# Patient Record
Sex: Female | Born: 1965 | Race: Black or African American | Hispanic: No | Marital: Single | Smoking: Former smoker
Health system: Southern US, Community
[De-identification: ages and names within clinical notes are randomized; demographics above are authoritative.]

## PROBLEM LIST (undated history)

## (undated) DIAGNOSIS — E785 Hyperlipidemia, unspecified: Secondary | ICD-10-CM

## (undated) DIAGNOSIS — I480 Paroxysmal atrial fibrillation: Secondary | ICD-10-CM

## (undated) DIAGNOSIS — I509 Heart failure, unspecified: Secondary | ICD-10-CM

## (undated) DIAGNOSIS — I10 Essential (primary) hypertension: Secondary | ICD-10-CM

## (undated) DIAGNOSIS — E119 Type 2 diabetes mellitus without complications: Secondary | ICD-10-CM

## (undated) HISTORY — DX: Paroxysmal atrial fibrillation: I48.0

## (undated) HISTORY — DX: Heart failure, unspecified: I50.9

## (undated) HISTORY — PX: DILATION AND CURETTAGE OF UTERUS: SHX78

## (undated) HISTORY — PX: KNEE ARTHROSCOPY AND ARTHROTOMY: SUR84

## (undated) HISTORY — DX: Hyperlipidemia, unspecified: E78.5

---

## 2017-11-28 ENCOUNTER — Encounter: Payer: Self-pay | Admitting: Certified Nurse Midwife

## 2017-12-10 ENCOUNTER — Emergency Department
Admission: EM | Admit: 2017-12-10 | Discharge: 2017-12-10 | Disposition: A | Discharge status: Home / Self Care | Payer: Self-pay | Attending: Emergency Medicine | Admitting: Emergency Medicine

## 2017-12-10 ENCOUNTER — Encounter: Payer: Self-pay | Admitting: Emergency Medicine

## 2017-12-10 DIAGNOSIS — I10 Essential (primary) hypertension: Secondary | ICD-10-CM | POA: Insufficient documentation

## 2017-12-10 DIAGNOSIS — Z79899 Other long term (current) drug therapy: Secondary | ICD-10-CM | POA: Insufficient documentation

## 2017-12-10 DIAGNOSIS — E119 Type 2 diabetes mellitus without complications: Secondary | ICD-10-CM | POA: Insufficient documentation

## 2017-12-10 DIAGNOSIS — Z760 Encounter for issue of repeat prescription: Secondary | ICD-10-CM | POA: Insufficient documentation

## 2017-12-10 HISTORY — DX: Essential (primary) hypertension: I10

## 2017-12-10 HISTORY — DX: Type 2 diabetes mellitus without complications: E11.9

## 2017-12-10 MED ORDER — LISINOPRIL 10 MG PO TABS: 10.0000 mg | ORAL_TABLET | Freq: Every day | ORAL | 0 refills | Status: DC

## 2017-12-10 MED ORDER — METOPROLOL TARTRATE 50 MG PO TABS
50.0000 mg | ORAL_TABLET | Freq: Once | ORAL | Status: AC
  Administered 2017-12-10: 50 mg via ORAL
  Filled 2017-12-10: qty 1

## 2017-12-10 MED ORDER — METOPROLOL TARTRATE 50 MG PO TABS: 50.0000 mg | ORAL_TABLET | Freq: Two times a day (BID) | ORAL | 0 refills | Status: DC

## 2017-12-10 MED ORDER — LISINOPRIL 10 MG PO TABS
10.0000 mg | ORAL_TABLET | Freq: Once | ORAL | Status: AC
  Administered 2017-12-10: 10 mg via ORAL
  Filled 2017-12-10: qty 1

## 2017-12-10 NOTE — ED Triage Notes (Signed)
Patient states she ran out of her blood pressure medication yesterday.  Lisinopril 10mg  once a day and metoprolol 50mg  2x daily.  Patient states [facility] [place] [facility] [place] and does not yet have a primary care doctor.

## 2017-12-10 NOTE — ED Provider Notes (Signed)
[facility] Emergency Department Provider Note  ____________________________________________  Time seen: Approximately 12:45 PM  I have reviewed the triage vital signs and the nursing notes.   HISTORY  Chief Complaint Medication Refill    HPI Jennifer Miller is a 52 y.o. female presents emergency department for medication.  Patient has been on lisinopril and metoprolol for years.  She ran out of her medication yesterday.  She just moved here from [place].  She tried to go to urgent care yesterday for refill and her Medicaid was not accepted yet.  She has an appointment with primary care to be established on the 29th.  No concerns with medications.  She denies any symptoms.  Past Medical History:  Diagnosis Date  . Diabetes mellitus without complication (HCC)   . Hypertension     There are no active problems to display for this patient.    Prior to Admission medications   Medication Sig Start Date End Date Taking? Authorizing Provider  lisinopril (PRINIVIL,ZESTRIL) 10 MG tablet Take 10 mg by mouth daily.   Yes [provider]  metoprolol tartrate (LOPRESSOR) 50 MG tablet Take 50 mg by mouth 2 (two) times daily.   Yes [provider]  lisinopril (PRINIVIL,ZESTRIL) 10 MG tablet Take 1 tablet (10 mg total) by mouth daily. 12/10/17 12/10/18  Enid Derry, PA-C  metoprolol tartrate (LOPRESSOR) 50 MG tablet Take 1 tablet (50 mg total) by mouth 2 (two) times daily. 12/10/17 01/09/18  Enid Derry, PA-C    Allergies Patient has no known allergies.  No family history on file.  Social History Social History   Tobacco Use  . Smoking status: Not on file  Substance Use Topics  . Alcohol use: Not on file  . Drug use: Not on file     Review of Systems  Cardiovascular: No chest pain. Respiratory: No SOB. Skin: Negative for rash, abrasions, lacerations, ecchymosis. Neurological: Negative for headaches, numbness or  tingling   ____________________________________________   PHYSICAL EXAM:  VITAL SIGNS: ED Triage Vitals  Enc Vitals Group     BP 12/10/17 1216 (!) 180/107     Pulse Rate 12/10/17 1216 88     Resp 12/10/17 1216 14     Temp 12/10/17 1216 98.1 F (36.7 C)     Temp Source 12/10/17 1216 Oral     SpO2 12/10/17 1216 94 %     Weight 12/10/17 1212 248 lb (112.5 kg)     Height 12/10/17 1212 5\' 6"  (1.676 m)     Head Circumference --      Peak Flow --      Pain Score 12/10/17 1211 6     Pain Loc --      Pain Edu? --      Excl. in GC? --      Constitutional: Alert and oriented. Well appearing and in no acute distress. Eyes: Conjunctivae are normal. PERRL. EOMI. Head: Atraumatic. ENT:      Ears:      Nose: No congestion/rhinnorhea.      Mouth/Throat: Mucous membranes are moist.  Neck: No stridor.  Cardiovascular: Normal rate, regular rhythm.  Good peripheral circulation. Respiratory: Normal respiratory effort without tachypnea or retractions. Lungs CTAB. Good air entry to the bases with no decreased or absent breath sounds. Musculoskeletal: Full range of motion to all extremities. No gross deformities appreciated. Neurologic:  Normal speech and language. No gross focal neurologic deficits are appreciated.  Skin:  Skin is warm, dry and intact. No rash  noted. Psychiatric: Mood and affect are normal. Speech and behavior are normal. Patient exhibits appropriate insight and judgement.   ____________________________________________   LABS (all labs ordered are listed, but only abnormal results are displayed)  Labs Reviewed - No data to display ____________________________________________  EKG   ____________________________________________  RADIOLOGY   No results found.  ____________________________________________    PROCEDURES  Procedure(s) performed:    Procedures    Medications  lisinopril (PRINIVIL,ZESTRIL) tablet 10 mg (10 mg Oral Given 12/10/17 1307)   metoprolol tartrate (LOPRESSOR) tablet 50 mg (50 mg Oral Given 12/10/17 1307)     ____________________________________________   INITIAL IMPRESSION / ASSESSMENT AND PLAN / ED COURSE  Pertinent labs & imaging results that were available during my care of the patient were reviewed by me and considered in my medical decision making (see chart for details).  Review of the [place] CSRS was performed in accordance of the [facility] prior to dispensing any controlled drugs.     Patient presented to the emergency department for medication refill.  Vital signs and exam are reassuring.  Lisinopril and metoprolol were refilled.  She has an appointment with primary care on the 29th.  Patient will be discharged home with prescriptions for lisinopril and metoprolol. Patient is to follow up with primary care as directed. Patient is given ED precautions to return to the ED for any worsening or new symptoms.     ____________________________________________  FINAL CLINICAL IMPRESSION(S) / ED DIAGNOSES  Final diagnoses:  Medication refill  Hypertension, unspecified type      NEW MEDICATIONS STARTED DURING THIS VISIT:  ED Discharge Orders         Ordered    lisinopril (PRINIVIL,ZESTRIL) 10 MG tablet  Daily     12/10/17 1256    metoprolol tartrate (LOPRESSOR) 50 MG tablet  2 times daily     12/10/17 1256              This chart was dictated using voice recognition software/Dragon. Despite best efforts to proofread, errors can occur which can change the meaning. Any change was purely unintentional.    Enid DerryWagner, Tavin Vernet, PA-C 12/10/17 1617    Arnaldo NatalMalinda, Paul F, MD 12/10/17 [id number]

## 2017-12-10 NOTE — ED Notes (Signed)
See triage note  States she ran out of b/p meds and needs a refill...denies any other sxs'

## 2017-12-29 ENCOUNTER — Encounter: Payer: Self-pay | Admitting: Internal Medicine

## 2017-12-29 ENCOUNTER — Ambulatory Visit: Payer: Self-pay | Attending: Internal Medicine | Admitting: Internal Medicine

## 2017-12-29 VITALS — BP 159/108 | HR 67 | Temp 98.3°F | Resp 16 | Ht 67.0 in | Wt 241.8 lb

## 2017-12-29 DIAGNOSIS — Z7984 Long term (current) use of oral hypoglycemic drugs: Secondary | ICD-10-CM | POA: Insufficient documentation

## 2017-12-29 DIAGNOSIS — I509 Heart failure, unspecified: Secondary | ICD-10-CM

## 2017-12-29 DIAGNOSIS — Z79899 Other long term (current) drug therapy: Secondary | ICD-10-CM | POA: Insufficient documentation

## 2017-12-29 DIAGNOSIS — I10 Essential (primary) hypertension: Secondary | ICD-10-CM

## 2017-12-29 DIAGNOSIS — F419 Anxiety disorder, unspecified: Secondary | ICD-10-CM | POA: Insufficient documentation

## 2017-12-29 DIAGNOSIS — E039 Hypothyroidism, unspecified: Secondary | ICD-10-CM

## 2017-12-29 DIAGNOSIS — Z87891 Personal history of nicotine dependence: Secondary | ICD-10-CM | POA: Insufficient documentation

## 2017-12-29 DIAGNOSIS — Z560 Unemployment, unspecified: Secondary | ICD-10-CM | POA: Insufficient documentation

## 2017-12-29 DIAGNOSIS — D509 Iron deficiency anemia, unspecified: Secondary | ICD-10-CM

## 2017-12-29 DIAGNOSIS — Z86010 Personal history of colonic polyps: Secondary | ICD-10-CM

## 2017-12-29 DIAGNOSIS — E785 Hyperlipidemia, unspecified: Secondary | ICD-10-CM

## 2017-12-29 DIAGNOSIS — Z2821 Immunization not carried out because of patient refusal: Secondary | ICD-10-CM

## 2017-12-29 DIAGNOSIS — E119 Type 2 diabetes mellitus without complications: Secondary | ICD-10-CM

## 2017-12-29 DIAGNOSIS — R079 Chest pain, unspecified: Secondary | ICD-10-CM | POA: Insufficient documentation

## 2017-12-29 DIAGNOSIS — I110 Hypertensive heart disease with heart failure: Secondary | ICD-10-CM | POA: Insufficient documentation

## 2017-12-29 DIAGNOSIS — I480 Paroxysmal atrial fibrillation: Secondary | ICD-10-CM

## 2017-12-29 LAB — POCT GLYCOSYLATED HEMOGLOBIN (HGB A1C): HBA1C, POC (PREDIABETIC RANGE): 5.9 % (ref 5.7–6.4)

## 2017-12-29 LAB — GLUCOSE, POCT (MANUAL RESULT ENTRY): POC Glucose: 95 mg/dl (ref 70–99)

## 2017-12-29 MED ORDER — CHOLECALCIFEROL 10 MCG (400 UNIT) PO TABS: 400.0000 [IU] | ORAL_TABLET | Freq: Every day | ORAL | 1 refills | Status: DC

## 2017-12-29 MED ORDER — LISINOPRIL 10 MG PO TABS: 10.0000 mg | ORAL_TABLET | Freq: Every day | ORAL | 6 refills | Status: DC

## 2017-12-29 MED ORDER — FERROUS SULFATE 325 (65 FE) MG PO TABS: 325.0000 mg | ORAL_TABLET | Freq: Two times a day (BID) | ORAL | 3 refills | Status: DC

## 2017-12-29 MED ORDER — METFORMIN HCL 1000 MG PO TABS: 1000.0000 mg | ORAL_TABLET | Freq: Two times a day (BID) | ORAL | 6 refills | Status: DC

## 2017-12-29 MED ORDER — ATORVASTATIN CALCIUM 20 MG PO TABS: 20.0000 mg | ORAL_TABLET | Freq: Every day | ORAL | 6 refills | Status: DC

## 2017-12-29 MED ORDER — LEVOTHYROXINE SODIUM 100 MCG PO TABS: 50.0000 ug | ORAL_TABLET | Freq: Every day | ORAL | 6 refills | Status: DC

## 2017-12-29 MED ORDER — DABIGATRAN ETEXILATE MESYLATE 150 MG PO CAPS: 150.0000 mg | ORAL_CAPSULE | Freq: Two times a day (BID) | ORAL | 6 refills | Status: DC

## 2017-12-29 MED ORDER — METOPROLOL TARTRATE 50 MG PO TABS: 50.0000 mg | ORAL_TABLET | Freq: Two times a day (BID) | ORAL | 6 refills | Status: DC

## 2017-12-29 NOTE — Patient Instructions (Addendum)
Please sign a release at the front desk for us to get your records from your previous physician Dr. Allena KatzPatel.  Get forms from the front desk for the cone discount card and the orange card.

## 2017-12-29 NOTE — Progress Notes (Signed)
CBG- 95 A1C- 5.9  Pt states she hit her finger and it hurts bad

## 2017-12-29 NOTE — Progress Notes (Signed)
Patient ID: Jennifer Miller, female    DOB: [birth year]  MRN: [id number]  CC: New Patient (Initial Visit); Diabetes; and Hypertension   Subjective: Jennifer Miller is a 52 y.o. female who presents for chronic ds and to est with me as new pt.  Daughter is with her Her concerns today include:  Pt with hx of CHF, a.fib, HL, HTN, hypothyroid, IDA, anxiety and DM.  Previous PCP was Allena Katz in [place].  She moved here in April.  She has several chronic medical issues for which she seeks attention today.  DM:  Dx  5-6 yrs ago. Compliant with Metformin Eating Habits: "I try to eat a balance meal."  She avoids too much sweets.  Drinks regular sodas Exercise:  Inconsistently.  Working on being more consistent  Gives hx of CHF and A.fib (dx 5-6 yrs ago).  No hx of ablation or cardioversion.  She was seeing a cardiologist -compliant with meds -Pradaxia, Lisinopril and Metoprolol -occasional palpitations.  LE edema only with inc salt intake.  -occasional CP.  Had neg cardiac cath more than 10 yrs ago.  Had low risk exercise stress test 08/2017. -no bruising, Menses more irregular since last yr.  Hx of fibroids.  Thyroid:  Hx of hypothyroid for yrs. reports occasional palpitations.  No feeling of being hot or cold all the time.  HM: Overdue for Pap smear and mammogram.  She reports having a colonoscopy done in the past and had some polyps removed.  She is due for repeat   No current outpatient medications on file prior to visit.   No current facility-administered medications on file prior to visit.     No Known Allergies  Social History   Socioeconomic History  . Marital status: Single    Spouse name: Not on file  . Number of children: 3  . Years of education: Not on file  . Highest education level: Not on file  Occupational History  . Occupation: unemployed  Social Needs  . Financial resource strain: Not on file  . Food insecurity:    Worry: Not on file    Inability: Not on file  .  Transportation needs:    Medical: Not on file    Non-medical: Not on file  Tobacco Use  . Smoking status: Former Games developer  . Smokeless tobacco: Never Used  Substance and Sexual Activity  . Alcohol use: Yes    Comment: rarely  . Drug use: Never  . Sexual activity: Not on file  Lifestyle  . Physical activity:    Days per week: Not on file    Minutes per session: Not on file  . Stress: Not on file  Relationships  . Social connections:    Talks on phone: Not on file    Gets together: Not on file    Attends religious service: Not on file    Active member of club or organization: Not on file    Attends meetings of clubs or organizations: Not on file    Relationship status: Not on file  . Intimate partner violence:    Fear of current or ex partner: Not on file    Emotionally abused: Not on file    Physically abused: Not on file    Forced sexual activity: Not on file  Other Topics Concern  . Not on file  Social History Narrative  . Not on file    Family History  Problem Relation Age of Onset  . Diabetes Mother   .  Hypertension Mother   . Dementia Mother   . Diabetes Father   . Hypertension Father   . Diabetes Maternal Grandmother   . Diabetes Paternal Grandmother   . Heart disease Paternal Grandmother     History reviewed. No pertinent surgical history.  ROS: Review of Systems  Constitutional: Negative for activity change, appetite change and unexpected weight change.  Eyes: Negative for visual disturbance.  Respiratory: Negative for shortness of breath.   Cardiovascular: Negative for chest pain.  Gastrointestinal: Negative for blood in stool.  Psychiatric/Behavioral: Negative for dysphoric mood. The patient is nervous/anxious.     PHYSICAL EXAM: BP (!) 159/108 (BP Location: Left Arm, Cuff Size: Large) Comment: recheck  Pulse 67   Temp 98.3 F (36.8 C) (Oral)   Resp 16   Ht 5\' 7"  (1.702 m)   Wt 241 lb 12.8 oz (109.7 kg)   LMP 12/01/2017   SpO2 98%   BMI  37.87 kg/m   Physical Exam  General appearance - alert, well appearing, middle-aged African-American female and in no distress Mental status - normal mood, behavior, speech, dress, motor activity, and thought processes Eyes - pupils equal and reactive, extraocular eye movements intact Mouth - mucous membranes moist, pharynx normal without lesions Neck - supple, no significant adenopathy Lymphatics - no palpable lymphadenopathy, no hepatosplenomegaly Chest - clear to auscultation, no wheezes, rales or rhonchi, symmetric air entry Heart - normal rate, regular rhythm, normal S1, S2, no murmurs, rubs, clicks or gallops Extremities - peripheral pulses normal, no pedal edema, no clubbing or cyanosis Diabetic Foot Exam - Simple   Simple Foot Form Visual Inspection No deformities, no ulcerations, no other skin breakdown bilaterally:  Yes Sensation Testing Intact to touch and monofilament testing bilaterally:  Yes Pulse Check Posterior Tibialis and Dorsalis pulse intact bilaterally:  Yes Comments    ASSESSMENT AND PLAN: 1. Controlled type 2 diabetes mellitus without complication, without long-term current use of insulin (HCC) Discussed the importance of healthy eating habits, regular aerobic exercise (at least 150 minutes a week as tolerated) and medication compliance to achieve or maintain control of diabetes. Continue metformin. - POCT glucose (manual entry) - POCT glycosylated hemoglobin (Hb A1C) - Microalbumin / creatinine urine ratio - CBC - Comprehensive metabolic panel - Lipid panel - metFORMIN (GLUCOPHAGE) 1000 MG tablet; Take 1 tablet (1,000 mg total) by mouth 2 (two) times daily with a meal.  Dispense: 60 tablet; Refill: 6  2. Essential hypertension Refills given on medications.  DASH diet discussed and encouraged. - lisinopril (PRINIVIL,ZESTRIL) 10 MG tablet; Take 1 tablet (10 mg total) by mouth daily.  Dispense: 30 tablet; Refill: 6 - metoprolol tartrate (LOPRESSOR) 50 MG  tablet; Take 1 tablet (50 mg total) by mouth 2 (two) times daily.  Dispense: 60 tablet; Refill: 6  3. Iron deficiency anemia, unspecified iron deficiency anemia type - Iron, TIBC and Ferritin Panel - ferrous sulfate 325 (65 FE) MG tablet; Take 1 tablet (325 mg total) by mouth 2 (two) times daily with a meal.  Dispense: 120 tablet; Refill: 3  4. PAF (paroxysmal atrial fibrillation) (HCC) - dabigatran (PRADAXA) 150 MG CAPS capsule; Take 1 capsule (150 mg total) by mouth 2 (two) times daily.  Dispense: 60 capsule; Refill: 6  5. Acquired hypothyroidism - TSH - levothyroxine (SYNTHROID, LEVOTHROID) 100 MCG tablet; Take 0.5 tablets (50 mcg total) by mouth daily before breakfast. Take 1/2 tablet by mouth dail y  Dispense: 15 tablet; Refill: 6  6. History of colon polyps   7.  Hyperlipidemia, unspecified hyperlipidemia type - atorvastatin (LIPITOR) 20 MG tablet; Take 1 tablet (20 mg total) by mouth daily.  Dispense: 30 tablet; Refill: 6  8. Congestive heart failure, unspecified HF chronicity, unspecified heart failure type (HCC) Compensated. Will need to get her old records to see if it is dCHF and last echo  9. Influenza vaccination declined  Patient was given the opportunity to ask questions.  Patient verbalized understanding of the plan and was able to repeat key elements of the plan.   Orders Placed This Encounter  Procedures  . Microalbumin / creatinine urine ratio  . CBC  . Comprehensive metabolic panel  . Lipid panel  . Iron, TIBC and Ferritin Panel  . TSH  . POCT glucose (manual entry)  . POCT glycosylated hemoglobin (Hb A1C)     Requested Prescriptions   Signed Prescriptions Disp Refills  . metFORMIN (GLUCOPHAGE) 1000 MG tablet 60 tablet 6    Sig: Take 1 tablet (1,000 mg total) by mouth 2 (two) times daily with a meal.  . lisinopril (PRINIVIL,ZESTRIL) 10 MG tablet 30 tablet 6    Sig: Take 1 tablet (10 mg total) by mouth daily.  . dabigatran (PRADAXA) 150 MG CAPS capsule  60 capsule 6    Sig: Take 1 capsule (150 mg total) by mouth 2 (two) times daily.  Marland Kitchen atorvastatin (LIPITOR) 20 MG tablet 30 tablet 6    Sig: Take 1 tablet (20 mg total) by mouth daily.  . ferrous sulfate 325 (65 FE) MG tablet 120 tablet 3    Sig: Take 1 tablet (325 mg total) by mouth 2 (two) times daily with a meal.  . cholecalciferol (VITAMIN D) 400 units TABS tablet 100 each 1    Sig: Take 1 tablet (400 Units total) by mouth daily.  Marland Kitchen levothyroxine (SYNTHROID, LEVOTHROID) 100 MCG tablet 15 tablet 6    Sig: Take 0.5 tablets (50 mcg total) by mouth daily before breakfast. Take 1/2 tablet by mouth dail y  . metoprolol tartrate (LOPRESSOR) 50 MG tablet 60 tablet 6    Sig: Take 1 tablet (50 mg total) by mouth 2 (two) times daily.    Return in about 6 weeks (around 02/09/2018) for PAP.  Jonah Blue, MD, FACP

## 2017-12-30 ENCOUNTER — Telehealth: Payer: Self-pay

## 2017-12-30 DIAGNOSIS — Z860100 Personal history of colon polyps, unspecified: Secondary | ICD-10-CM | POA: Insufficient documentation

## 2017-12-30 DIAGNOSIS — E119 Type 2 diabetes mellitus without complications: Secondary | ICD-10-CM | POA: Insufficient documentation

## 2017-12-30 DIAGNOSIS — I480 Paroxysmal atrial fibrillation: Secondary | ICD-10-CM | POA: Insufficient documentation

## 2017-12-30 DIAGNOSIS — I10 Essential (primary) hypertension: Secondary | ICD-10-CM | POA: Insufficient documentation

## 2017-12-30 DIAGNOSIS — E039 Hypothyroidism, unspecified: Secondary | ICD-10-CM | POA: Insufficient documentation

## 2017-12-30 DIAGNOSIS — I509 Heart failure, unspecified: Secondary | ICD-10-CM | POA: Insufficient documentation

## 2017-12-30 DIAGNOSIS — E785 Hyperlipidemia, unspecified: Secondary | ICD-10-CM | POA: Insufficient documentation

## 2017-12-30 DIAGNOSIS — D509 Iron deficiency anemia, unspecified: Secondary | ICD-10-CM | POA: Insufficient documentation

## 2017-12-30 LAB — COMPREHENSIVE METABOLIC PANEL
ALK PHOS: 52 IU/L (ref 39–117)
ALT: 15 IU/L (ref 0–32)
AST: 18 IU/L (ref 0–40)
Albumin/Globulin Ratio: 1.3 (ref 1.2–2.2)
Albumin: 4.3 g/dL (ref 3.5–5.5)
BILIRUBIN TOTAL: 0.3 mg/dL (ref 0.0–1.2)
BUN/Creatinine Ratio: 13 (ref 9–23)
BUN: 11 mg/dL (ref 6–24)
CO2: 26 mmol/L (ref 20–29)
Calcium: 10.2 mg/dL (ref 8.7–10.2)
Chloride: 101 mmol/L (ref 96–106)
Creatinine, Ser: 0.88 mg/dL (ref 0.57–1.00)
GFR calc Af Amer: 89 mL/min/{1.73_m2} (ref >59)
GFR calc non Af Amer: 77 mL/min/{1.73_m2} (ref >59)
GLUCOSE: 103 mg/dL — AB (ref 65–99)
Globulin, Total: 3.4 g/dL (ref 1.5–4.5)
Potassium: 5 mmol/L (ref 3.5–5.2)
Sodium: 142 mmol/L (ref 134–144)
TOTAL PROTEIN: 7.7 g/dL (ref 6.0–8.5)

## 2017-12-30 LAB — TSH: TSH: 1.45 u[IU]/mL (ref 0.450–4.500)

## 2017-12-30 LAB — LIPID PANEL
CHOL/HDL RATIO: 3.6 ratio (ref 0.0–4.4)
CHOLESTEROL TOTAL: 148 mg/dL (ref 100–199)
HDL: 41 mg/dL (ref >39)
LDL CALC: 88 mg/dL (ref 0–99)
TRIGLYCERIDES: 97 mg/dL (ref 0–149)
VLDL CHOLESTEROL CAL: 19 mg/dL (ref 5–40)

## 2017-12-30 LAB — IRON,TIBC AND FERRITIN PANEL
Ferritin: 23 ng/mL (ref 15–150)
Iron Saturation: 24 % (ref 15–55)
Iron: 86 ug/dL (ref 27–159)
Total Iron Binding Capacity: 359 ug/dL (ref 250–450)
UIBC: 273 ug/dL (ref 131–425)

## 2017-12-30 LAB — CBC
Hematocrit: 42.2 % (ref 34.0–46.6)
Hemoglobin: 13.1 g/dL (ref 11.1–15.9)
MCH: 25 pg — ABNORMAL LOW (ref 26.6–33.0)
MCHC: 31 g/dL — AB (ref 31.5–35.7)
MCV: 81 fL (ref 79–97)
PLATELETS: 365 10*3/uL (ref 150–450)
RBC: 5.24 x10E6/uL (ref 3.77–5.28)
RDW: 15.4 % (ref 12.3–15.4)
WBC: 5.7 10*3/uL (ref 3.4–10.8)

## 2017-12-30 LAB — MICROALBUMIN / CREATININE URINE RATIO
CREATININE, UR: 236 mg/dL
MICROALBUM., U, RANDOM: 254.5 ug/mL
Microalb/Creat Ratio: 107.8 mg/g creat — ABNORMAL HIGH (ref 0.0–30.0)

## 2017-12-30 NOTE — Telephone Encounter (Signed)
Contacted pt to go over lab results pt didn't answer was unable to lvm due to mailbox being full  If pt calls back please give results: blood count is normal meaning she is no longer anemic. Iron level is okay. I recommend taking iron only twice a week instead of every day. Thyroid level was normal. Kidney and liver function tests are good. LDL cholesterol is 88 with goal being less than 70. Continue taking atorvastatin.

## 2018-01-11 MED FILL — LEVOTHYROXINE 100 MCG TAB: 100 | 30 days supply | Qty: 15 | Fill #0

## 2018-01-11 MED FILL — LISINOPRIL 10 MG TABS: 10 | 30 days supply | Qty: 30 | Fill #0

## 2018-01-17 ENCOUNTER — Telehealth: Payer: Self-pay | Admitting: Internal Medicine

## 2018-01-18 MED FILL — metFORMIN HCL 1000 MG TABS: 1000 | 30 days supply | Qty: 60 | Fill #0

## 2018-01-18 MED FILL — METOPROLOL TARTRATE 50 MG T: 50 | 30 days supply | Qty: 60 | Fill #0

## 2018-02-09 ENCOUNTER — Encounter: Payer: Self-pay | Admitting: Internal Medicine

## 2018-02-09 ENCOUNTER — Ambulatory Visit: Payer: Self-pay | Attending: Internal Medicine | Admitting: Internal Medicine

## 2018-02-09 VITALS — BP 163/108 | HR 67 | Temp 98.1°F | Resp 16 | Wt 233.2 lb

## 2018-02-09 DIAGNOSIS — N888 Other specified noninflammatory disorders of cervix uteri: Secondary | ICD-10-CM

## 2018-02-09 DIAGNOSIS — Z8249 Family history of ischemic heart disease and other diseases of the circulatory system: Secondary | ICD-10-CM | POA: Insufficient documentation

## 2018-02-09 DIAGNOSIS — I110 Hypertensive heart disease with heart failure: Secondary | ICD-10-CM | POA: Insufficient documentation

## 2018-02-09 DIAGNOSIS — E119 Type 2 diabetes mellitus without complications: Secondary | ICD-10-CM | POA: Insufficient documentation

## 2018-02-09 DIAGNOSIS — Z7901 Long term (current) use of anticoagulants: Secondary | ICD-10-CM | POA: Insufficient documentation

## 2018-02-09 DIAGNOSIS — Z833 Family history of diabetes mellitus: Secondary | ICD-10-CM | POA: Insufficient documentation

## 2018-02-09 DIAGNOSIS — Z87891 Personal history of nicotine dependence: Secondary | ICD-10-CM | POA: Insufficient documentation

## 2018-02-09 DIAGNOSIS — Z7984 Long term (current) use of oral hypoglycemic drugs: Secondary | ICD-10-CM | POA: Insufficient documentation

## 2018-02-09 DIAGNOSIS — D509 Iron deficiency anemia, unspecified: Secondary | ICD-10-CM | POA: Insufficient documentation

## 2018-02-09 DIAGNOSIS — I509 Heart failure, unspecified: Secondary | ICD-10-CM | POA: Insufficient documentation

## 2018-02-09 DIAGNOSIS — Z2821 Immunization not carried out because of patient refusal: Secondary | ICD-10-CM

## 2018-02-09 DIAGNOSIS — Z124 Encounter for screening for malignant neoplasm of cervix: Secondary | ICD-10-CM

## 2018-02-09 DIAGNOSIS — I10 Essential (primary) hypertension: Secondary | ICD-10-CM

## 2018-02-09 DIAGNOSIS — Z1239 Encounter for other screening for malignant neoplasm of breast: Secondary | ICD-10-CM

## 2018-02-09 DIAGNOSIS — E785 Hyperlipidemia, unspecified: Secondary | ICD-10-CM | POA: Insufficient documentation

## 2018-02-09 DIAGNOSIS — Z01419 Encounter for gynecological examination (general) (routine) without abnormal findings: Secondary | ICD-10-CM | POA: Insufficient documentation

## 2018-02-09 DIAGNOSIS — I480 Paroxysmal atrial fibrillation: Secondary | ICD-10-CM | POA: Insufficient documentation

## 2018-02-09 DIAGNOSIS — Z79890 Hormone replacement therapy (postmenopausal): Secondary | ICD-10-CM | POA: Insufficient documentation

## 2018-02-09 MED ORDER — AMLODIPINE BESYLATE 5 MG PO TABS: 5.0000 mg | ORAL_TABLET | Freq: Every day | ORAL | 3 refills | Status: DC

## 2018-02-09 MED FILL — LEVOTHYROXINE 100 MCG TAB: 100 | 30 days supply | Qty: 15 | Fill #1

## 2018-02-09 MED FILL — METOPROLOL TARTRATE 50 MG T: 50 | 30 days supply | Qty: 60 | Fill #1

## 2018-02-09 MED FILL — LISINOPRIL 10 MG TABS: 10 | 30 days supply | Qty: 30 | Fill #1

## 2018-02-09 MED FILL — metFORMIN HCL 1000 MG TABS: 1000 | 30 days supply | Qty: 60 | Fill #1

## 2018-02-09 NOTE — Progress Notes (Signed)
Patient ID: Jennifer Miller, female    DOB: Feb 26, 1966  MRN: [id number]  CC: Gynecologic Exam   Subjective: Jennifer Miller is a 52 y.o. female who presents for PAP Her concerns today include:  Pt with hx of CHF, a.fib, HL, HTN, hypothyroid, IDA with hx of fibroid tumors, anxiety and DM.   We did blood test on last visit.  My CMA attempted to call with results but her voice mailbox was full.  I went over labs with her today.  She was no longer anemic.  Iron level was okay.  Ferritin level was in the low normal range.  I wanted her to decrease iron supplement to twice a week but, again patient did not receive the message.  LDL was slightly above goal.  She reports compliance with Lipitor.  Pt is G5P3 (1 miscarriage and 1 abortion) LNMP 12/2017.  Menses irregular for last 1 yr. Menses were heavy since end of last yr,  lasts 5-6 days at a time. Would have to use pads 6-7x a day for 2nd-3rd day of cycles.  Has history of fibroid tumors. Last pap was 12 yrs ago. Sexually active Uses condoms inconsistently No vaginal discgh or itching  HTN:  BP noted to be elevated today.  Had a pork sausage today Does not check BP; has a device but can not locate it Reports compliance with Lisiniopril and Metoprolol  Patient Active Problem List   Diagnosis Date Noted  . Controlled type 2 diabetes mellitus without complication, without long-term current use of insulin (HCC) 12/30/2017  . Essential hypertension 12/30/2017  . Iron deficiency anemia 12/30/2017  . PAF (paroxysmal atrial fibrillation) (HCC) 12/30/2017  . Acquired hypothyroidism 12/30/2017  . History of colon polyps 12/30/2017  . Hyperlipidemia 12/30/2017  . Congestive heart failure (HCC) 12/30/2017     Current Outpatient Medications on File Prior to Visit  Medication Sig Dispense Refill  . atorvastatin (LIPITOR) 20 MG tablet Take 1 tablet (20 mg total) by mouth daily. 30 tablet 6  . cholecalciferol (VITAMIN D) 400 units TABS tablet Take 1  tablet (400 Units total) by mouth daily. 100 each 1  . dabigatran (PRADAXA) 150 MG CAPS capsule Take 1 capsule (150 mg total) by mouth 2 (two) times daily. 60 capsule 6  . ferrous sulfate 325 (65 FE) MG tablet Take 1 tablet (325 mg total) by mouth 2 (two) times daily with a meal. 120 tablet 3  . levothyroxine (SYNTHROID, LEVOTHROID) 100 MCG tablet Take 0.5 tablets (50 mcg total) by mouth daily before breakfast. Take 1/2 tablet by mouth dail y 15 tablet 6  . lisinopril (PRINIVIL,ZESTRIL) 10 MG tablet Take 1 tablet (10 mg total) by mouth daily. 30 tablet 6  . metFORMIN (GLUCOPHAGE) 1000 MG tablet Take 1 tablet (1,000 mg total) by mouth 2 (two) times daily with a meal. 60 tablet 6  . metoprolol tartrate (LOPRESSOR) 50 MG tablet Take 1 tablet (50 mg total) by mouth 2 (two) times daily. 60 tablet 6   No current facility-administered medications on file prior to visit.     No Known Allergies  Social History   Socioeconomic History  . Marital status: Single    Spouse name: Not on file  . Number of children: 3  . Years of education: Not on file  . Highest education level: Not on file  Occupational History  . Occupation: unemployed  Social Needs  . Financial resource strain: Not on file  . Food insecurity:    Worry: Not  on file    Inability: Not on file  . Transportation needs:    Medical: Not on file    Non-medical: Not on file  Tobacco Use  . Smoking status: Former Games developer  . Smokeless tobacco: Never Used  Substance and Sexual Activity  . Alcohol use: Yes    Comment: rarely  . Drug use: Never  . Sexual activity: Not on file  Lifestyle  . Physical activity:    Days per week: Not on file    Minutes per session: Not on file  . Stress: Not on file  Relationships  . Social connections:    Talks on phone: Not on file    Gets together: Not on file    Attends religious service: Not on file    Active member of club or organization: Not on file    Attends meetings of clubs or  organizations: Not on file    Relationship status: Not on file  . Intimate partner violence:    Fear of current or ex partner: Not on file    Emotionally abused: Not on file    Physically abused: Not on file    Forced sexual activity: Not on file  Other Topics Concern  . Not on file  Social History Narrative  . Not on file    Family History  Problem Relation Age of Onset  . Diabetes Mother   . Hypertension Mother   . Dementia Mother   . Diabetes Father   . Hypertension Father   . Diabetes Maternal Grandmother   . Diabetes Paternal Grandmother   . Heart disease Paternal Grandmother     No past surgical history on file.  ROS: Review of Systems Neg except as above  PHYSICAL EXAM: BP (!) 163/108 (BP Location: Right Arm, Cuff Size: Large) Comment: recheck  Pulse 67   Temp 98.1 F (36.7 C) (Oral)   Resp 16   Wt 233 lb 3.2 oz (105.8 kg)   SpO2 98%   BMI 36.52 kg/m   Physical Exam  General appearance - alert, well appearing, and in no distress Mental status - normal mood, behavior, speech, dress, motor activity, and thought processes Breasts - CMA Julius Bowels present:  breasts are pendulous but appear normal, no suspicious masses, no skin or nipple changes or axillary nodes Pelvic - CMA Pollock present: No external vaginal lesions.  Vaginal mucosa appears normal.  Patient has what appears to be a fleshy polyp hanging outside of the cervical loss.  No cervical motion tenderness or adnexal masses.  Uterus feels top normal size.  ASSESSMENT AND PLAN: 1. Pap smear for cervical cancer screening - Cytology - PAP  2. Cervical mass -Likely polyp.  However will refer to gynecology for further evaluation - Ambulatory referral to Gynecology  3. Essential hypertension Not at goal.  Recommend low-salt diet. Encouraged her to try to find her blood pressure device and check blood pressure at least twice a week with goal of 130/80 or lower. Add amlodipine - amLODipine (NORVASC) 5 MG  tablet; Take 1 tablet (5 mg total) by mouth daily.  Dispense: 90 tablet; Refill: 3  4. Breast cancer screening - MM Digital Screening; Future  5. Influenza vaccination declined  6.  Iron deficiency anemia I recommend decreasing iron supplement to 1 tablet twice a week.  Patient was given the opportunity to ask questions.  Patient verbalized understanding of the plan and was able to repeat key elements of the plan.   Orders Placed This Encounter  Procedures  . MM Digital Screening  . Ambulatory referral to Gynecology     Requested Prescriptions   Signed Prescriptions Disp Refills  . amLODipine (NORVASC) 5 MG tablet 90 tablet 3    Sig: Take 1 tablet (5 mg total) by mouth daily.    Return in about 3 months (around 05/12/2018).  Jonah Blue, MD, FACP

## 2018-02-09 NOTE — Patient Instructions (Addendum)
Give appointment wit Franky Macho for BP check in 1 month.  Start Amlodipine 5 mg daily to help better control your blood pressure. Please try to check your blood pressure at least twice a week.  The goal is 130/80 or lower. Please cut back on the iron to 1 tablet twice a week. I have referred you to the gynecologist for further evaluation of a polyp that is seen on your cervix.

## 2018-02-10 MED FILL — AMLODIPINE BESYLATE 5 MG TA: 5 | 30 days supply | Qty: 30 | Fill #0

## 2018-02-14 LAB — CYTOLOGY - PAP
BACTERIAL VAGINITIS: POSITIVE — AB
Chlamydia: NEGATIVE
Diagnosis: UNDETERMINED — AB
HPV (WINDOPATH): NOT DETECTED
Neisseria Gonorrhea: NEGATIVE
TRICH (WINDOWPATH): NEGATIVE

## 2018-02-15 ENCOUNTER — Other Ambulatory Visit: Payer: Self-pay | Admitting: Internal Medicine

## 2018-02-15 ENCOUNTER — Telehealth: Payer: Self-pay

## 2018-02-15 MED ORDER — METRONIDAZOLE 500 MG PO TABS: 500.0000 mg | ORAL_TABLET | Freq: Two times a day (BID) | ORAL | 0 refills | Status: DC

## 2018-02-15 NOTE — Telephone Encounter (Signed)
Contacted pt to go over lab results pt didn't answer lvm asking pt to give me a call back at her earliest convenience  

## 2018-02-16 ENCOUNTER — Telehealth: Payer: Self-pay | Admitting: Internal Medicine

## 2018-02-16 NOTE — Telephone Encounter (Signed)
What pharmacy does pt want rx sent to if approved by pcp

## 2018-02-16 NOTE — Telephone Encounter (Signed)
Patient wanted to get refills for her furosemide 40mg  however, it is not listed as a current medication she is taking. Please follow up.

## 2018-03-07 ENCOUNTER — Encounter: Payer: Medicaid Other | Admitting: Obstetrics & Gynecology

## 2018-03-14 MED FILL — AMLODIPINE BESYLATE 5 MG TA: 5 | 30 days supply | Qty: 30 | Fill #1

## 2018-03-14 MED FILL — LISINOPRIL 10 MG TABS: 10 | 30 days supply | Qty: 30 | Fill #2

## 2018-03-14 MED FILL — LEVOTHYROXINE 100 MCG TAB: 100 | 30 days supply | Qty: 15 | Fill #2

## 2018-03-27 ENCOUNTER — Other Ambulatory Visit: Payer: Self-pay

## 2018-03-27 ENCOUNTER — Emergency Department: Payer: Medicaid Other

## 2018-03-27 ENCOUNTER — Emergency Department
Admission: EM | Admit: 2018-03-27 | Discharge: 2018-03-27 | Disposition: A | Discharge status: Home / Self Care | Payer: Medicaid Other | Attending: Emergency Medicine | Admitting: Emergency Medicine

## 2018-03-27 ENCOUNTER — Encounter: Payer: Self-pay | Admitting: *Deleted

## 2018-03-27 DIAGNOSIS — Z79899 Other long term (current) drug therapy: Secondary | ICD-10-CM | POA: Insufficient documentation

## 2018-03-27 DIAGNOSIS — I509 Heart failure, unspecified: Secondary | ICD-10-CM | POA: Insufficient documentation

## 2018-03-27 DIAGNOSIS — M109 Gout, unspecified: Secondary | ICD-10-CM

## 2018-03-27 DIAGNOSIS — I110 Hypertensive heart disease with heart failure: Secondary | ICD-10-CM | POA: Insufficient documentation

## 2018-03-27 DIAGNOSIS — Z87891 Personal history of nicotine dependence: Secondary | ICD-10-CM | POA: Insufficient documentation

## 2018-03-27 DIAGNOSIS — E119 Type 2 diabetes mellitus without complications: Secondary | ICD-10-CM | POA: Insufficient documentation

## 2018-03-27 DIAGNOSIS — Z7984 Long term (current) use of oral hypoglycemic drugs: Secondary | ICD-10-CM | POA: Insufficient documentation

## 2018-03-27 DIAGNOSIS — I480 Paroxysmal atrial fibrillation: Secondary | ICD-10-CM | POA: Insufficient documentation

## 2018-03-27 DIAGNOSIS — M10031 Idiopathic gout, right wrist: Secondary | ICD-10-CM | POA: Insufficient documentation

## 2018-03-27 MED ORDER — COLCHICINE 0.6 MG PO TABS: 0.6000 mg | ORAL_TABLET | Freq: Every day | ORAL | 0 refills | Status: DC

## 2018-03-27 MED ORDER — COLCHICINE 0.6 MG PO TABS
1.8000 mg | ORAL_TABLET | Freq: Once | ORAL | Status: AC
  Administered 2018-03-27: 1.8 mg via ORAL
  Filled 2018-03-27: qty 3

## 2018-03-27 NOTE — ED Triage Notes (Signed)
Pt has pain and swelling in right wrist and right hand pain.  No known injury   Pt alert

## 2018-03-27 NOTE — ED Provider Notes (Signed)
[facility] Emergency Department Provider Note  ____________________________________________  Time seen: Approximately 8:32 PM  I have reviewed the triage vital signs and the nursing notes.   HISTORY  Chief Complaint Wrist Pain    HPI Jennifer Miller is a [age] y.o. female who presents the emergency department complaining of a right wrist pain, edema.  Patient reports that she has had nontraumatic right wrist pain for the past 2 days.  She reports that it is a burning/grinding sensation with movement.  Again, she denies any trauma or repetitive injury to the right hand or wrist.  Patient reports the area is warm to palpation.  No overlying skin injuries.  Patient reports that she does eat red meat, does drink [facility].  Patient also has a history of type 2 diabetes, hypertension.  No history of gout.  Patient tried Tylenol which she reports helped her sleep but has not alleviated the symptoms completely.  No other complaint.  Patient denies any fevers or chills, headache, chest pain, shortness of breath, abdominal pain, nausea or vomiting.   Past Medical History:  Diagnosis Date  . Diabetes mellitus without complication (HCC)   . Hypertension     Patient Active Problem List   Diagnosis Date Noted  . Controlled type 2 diabetes mellitus without complication, without long-term current use of insulin (HCC) 12/30/2017  . Essential hypertension 12/30/2017  . Iron deficiency anemia 12/30/2017  . PAF (paroxysmal atrial fibrillation) (HCC) 12/30/2017  . Acquired hypothyroidism 12/30/2017  . History of colon polyps 12/30/2017  . Hyperlipidemia 12/30/2017  . Congestive heart failure (HCC) 12/30/2017    No past surgical history on file.  Prior to Admission medications   Medication Sig Start Date End Date Taking? Authorizing Provider  amLODipine (NORVASC) 5 MG tablet Take 1 tablet (5 mg total) by mouth daily. 02/09/18   Marcine MatarJohnson, Deborah B, MD  atorvastatin (LIPITOR)  20 MG tablet Take 1 tablet (20 mg total) by mouth daily. 12/29/17   Marcine MatarJohnson, Deborah B, MD  cholecalciferol (VITAMIN D) 400 units TABS tablet Take 1 tablet (400 Units total) by mouth daily. 12/29/17   Marcine MatarJohnson, Deborah B, MD  colchicine 0.6 MG tablet Take 1 tablet (0.6 mg total) by mouth daily. Take 1 tab daily for at least 6 more days. If  Symptoms persist past 6 days continue to use until prescription is finished 03/27/18   Lynnley Doddridge, Delorise RoyalsJonathan D, PA-C  dabigatran (PRADAXA) 150 MG CAPS capsule Take 1 capsule (150 mg total) by mouth 2 (two) times daily. 12/29/17   Marcine MatarJohnson, Deborah B, MD  ferrous sulfate 325 (65 FE) MG tablet Take 1 tablet (325 mg total) by mouth 2 (two) times daily with a meal. 12/29/17   Marcine MatarJohnson, Deborah B, MD  levothyroxine (SYNTHROID, LEVOTHROID) 100 MCG tablet Take 0.5 tablets (50 mcg total) by mouth daily before breakfast. Take 1/2 tablet by mouth dail y 12/29/17   Marcine MatarJohnson, Deborah B, MD  lisinopril (PRINIVIL,ZESTRIL) 10 MG tablet Take 1 tablet (10 mg total) by mouth daily. 12/29/17   Marcine MatarJohnson, Deborah B, MD  metFORMIN (GLUCOPHAGE) 1000 MG tablet Take 1 tablet (1,000 mg total) by mouth 2 (two) times daily with a meal. 12/29/17   Marcine MatarJohnson, Deborah B, MD  metoprolol tartrate (LOPRESSOR) 50 MG tablet Take 1 tablet (50 mg total) by mouth 2 (two) times daily. 12/29/17   Marcine MatarJohnson, Deborah B, MD  metroNIDAZOLE (FLAGYL) 500 MG tablet Take 1 tablet (500 mg total) by mouth 2 (two) times daily. 02/15/18   Jonah BlueJohnson, Deborah  B, MD    Allergies Patient has no known allergies.  Family History  Problem Relation Age of Onset  . Diabetes Mother   . Hypertension Mother   . Dementia Mother   . Diabetes Father   . Hypertension Father   . Diabetes Maternal Grandmother   . Diabetes Paternal Grandmother   . Heart disease Paternal Grandmother     Social History Social History   Tobacco Use  . Smoking status: Former Games developer  . Smokeless tobacco: Never Used  Substance Use Topics  . Alcohol use: Yes     Comment: rarely  . Drug use: Never     Review of Systems  Constitutional: No fever/chills Eyes: No visual changes.  Cardiovascular: no chest pain. Respiratory: no cough. No SOB. Gastrointestinal: No abdominal pain.  No nausea, no vomiting.   Musculoskeletal: Positive for nontraumatic right wrist pain and edema Skin: Negative for rash, abrasions, lacerations, ecchymosis. Neurological: Negative for headaches, focal weakness or numbness. 10-point ROS otherwise negative.  ____________________________________________   PHYSICAL EXAM:  VITAL SIGNS: ED Triage Vitals  Enc Vitals Group     BP 03/27/18 1906 (!) 179/101     Pulse Rate 03/27/18 1906 76     Resp 03/27/18 1906 18     Temp 03/27/18 1906 98.6 F (37 C)     Temp Source 03/27/18 1906 Oral     SpO2 03/27/18 1906 95 %     Weight 03/27/18 1906 230 lb (104.3 kg)     Height 03/27/18 1906 5\' 7"  (1.702 m)     Head Circumference --      Peak Flow --      Pain Score 03/27/18 1914 9     Pain Loc --      Pain Edu? --      Excl. in GC? --      Constitutional: Alert and oriented. Well appearing and in no acute distress. Eyes: Conjunctivae are normal. PERRL. EOMI. Head: Atraumatic. Neck: No stridor.    Cardiovascular: Normal rate, regular rhythm. Normal S1 and S2.  Good peripheral circulation. Respiratory: Normal respiratory effort without tachypnea or retractions. Lungs CTAB. Good air entry to the bases with no decreased or absent breath sounds. Musculoskeletal: Full range of motion to all extremities. No gross deformities appreciated.  Visualization of the right wrist reveals mild edema but no erythema.  Patient has full range of motion with coaxing.  Patient is tender to palpation along the joint line with no palpable abnormality or deficit.  Negative Tinel's and Phalen's.  Negative Finkelstein's.  No tenderness to palpation of the osseous structures of the hand.  Capillary refill less than 2 seconds all digits.  Sensation intact  all 5 digits. Neurologic:  Normal speech and language. No gross focal neurologic deficits are appreciated.  Skin:  Skin is warm, dry and intact. No rash noted. Psychiatric: Mood and affect are normal. Speech and behavior are normal. Patient exhibits appropriate insight and judgement.   ____________________________________________   LABS (all labs ordered are listed, but only abnormal results are displayed)  Labs Reviewed - No data to display ____________________________________________  EKG   ____________________________________________  RADIOLOGY I personally viewed and evaluated these images as part of my medical decision making, as well as reviewing the written report by the radiologist.  I concur with radiologist finding of no acute osseous abnormality.  No appreciable joint effusion.  Dg Wrist Complete Right  Result Date: 03/27/2018 CLINICAL DATA:  Pain EXAM: RIGHT WRIST - COMPLETE 3+ VIEW COMPARISON:  None. FINDINGS: There is no evidence of fracture or dislocation. There is no evidence of arthropathy or other focal bone abnormality. Soft tissues are unremarkable. IMPRESSION: Negative. Electronically Signed   By: Jasmine Pang M.D.   On: 03/27/2018 19:58    ____________________________________________    PROCEDURES  Procedure(s) performed:    Procedures    Medications  colchicine tablet 1.8 mg (has no administration in time range)     ____________________________________________   INITIAL IMPRESSION / ASSESSMENT AND PLAN / ED COURSE  Pertinent labs & imaging results that were available during my care of the patient were reviewed by me and considered in my medical decision making (see chart for details).  Review of the [place] CSRS was performed in accordance of the [facility] prior to dispensing any controlled drugs.      Patient's diagnosis is consistent with gout to the right wrist.  Patient presents emergency department with nontraumatic right wrist pain and  edema.  On exam, joint is minimally warm, mildly edematous.  No erythema.  Exam is consistent with gout.  Differential includes right wrist sprain, fracture, joint effusion, carpal tunnel, de Quervain's tenosynovitis, ganglion cyst, gout, septic joint.  I discussed differential with patient to include labs and uric acid testing.  At this time, patient opts for medication without testing.  I feel this is reasonable.  There is no indication of septic joint on exam.  No indication for arthrocentesis.  Patient will be given colchicine for symptom relief.  Velcro splint as needed for additional symptomatic control..  Follow-up with primary care as needed.  Patient is given ED precautions to return to the ED for any worsening or new symptoms.     ____________________________________________  FINAL CLINICAL IMPRESSION(S) / ED DIAGNOSES  Final diagnoses:  Acute gout of right wrist, unspecified cause      NEW MEDICATIONS STARTED DURING THIS VISIT:  ED Discharge Orders         Ordered    colchicine 0.6 MG tablet  Daily     03/27/18 2038              This chart was dictated using voice recognition software/Dragon. Despite best efforts to proofread, errors can occur which can change the meaning. Any change was purely unintentional.    Racheal Patches, PA-C 03/27/18 2040    Jene Every, MD 03/27/18 2043

## 2018-03-28 ENCOUNTER — Telehealth: Payer: Self-pay | Admitting: Internal Medicine

## 2018-03-28 MED ORDER — FUROSEMIDE 40 MG PO TABS: 40.0000 mg | ORAL_TABLET | Freq: Every day | ORAL | 3 refills | Status: DC

## 2018-03-28 MED FILL — COLCHICINE 0.6 MG TABS: 0.6 | 10 days supply | Qty: 10 | Fill #0

## 2018-03-28 NOTE — Telephone Encounter (Signed)
Patient presented to our pharmacy today requesting refill on furosemide 40 mg tablet 1 tablet daily.  Patient reports that she has been taking this and [place] but forgot to added to her med list when she saw me.  Prescription sent to the pharmacy for furosemide 40 mg.

## 2018-03-29 MED FILL — FUROSEMIDE 40 MG TAB: 40 | 30 days supply | Qty: 30 | Fill #0

## 2018-04-03 MED FILL — METOPROLOL TARTRATE 50 MG T: 50 | 30 days supply | Qty: 60 | Fill #2

## 2018-04-14 MED FILL — AMLODIPINE BESYLATE 5 MG TA: 5 | 30 days supply | Qty: 30 | Fill #2

## 2018-04-14 MED FILL — LISINOPRIL 10 MG TABS: 10 | 30 days supply | Qty: 30 | Fill #3

## 2018-04-28 MED FILL — metFORMIN HCL 1000 MG TABS: 1000 | 30 days supply | Qty: 60 | Fill #2

## 2018-05-01 ENCOUNTER — Other Ambulatory Visit: Payer: Self-pay

## 2018-05-01 ENCOUNTER — Emergency Department
Admission: EM | Admit: 2018-05-01 | Discharge: 2018-05-01 | Disposition: A | Discharge status: Home / Self Care | Payer: Medicaid Other | Attending: Emergency Medicine | Admitting: Emergency Medicine

## 2018-05-01 ENCOUNTER — Encounter: Payer: Self-pay | Admitting: Emergency Medicine

## 2018-05-01 DIAGNOSIS — I110 Hypertensive heart disease with heart failure: Secondary | ICD-10-CM | POA: Insufficient documentation

## 2018-05-01 DIAGNOSIS — E119 Type 2 diabetes mellitus without complications: Secondary | ICD-10-CM | POA: Insufficient documentation

## 2018-05-01 DIAGNOSIS — Z79899 Other long term (current) drug therapy: Secondary | ICD-10-CM | POA: Insufficient documentation

## 2018-05-01 DIAGNOSIS — R197 Diarrhea, unspecified: Secondary | ICD-10-CM | POA: Insufficient documentation

## 2018-05-01 DIAGNOSIS — I509 Heart failure, unspecified: Secondary | ICD-10-CM | POA: Insufficient documentation

## 2018-05-01 DIAGNOSIS — Z87891 Personal history of nicotine dependence: Secondary | ICD-10-CM | POA: Insufficient documentation

## 2018-05-01 DIAGNOSIS — R109 Unspecified abdominal pain: Secondary | ICD-10-CM | POA: Insufficient documentation

## 2018-05-01 DIAGNOSIS — Z7984 Long term (current) use of oral hypoglycemic drugs: Secondary | ICD-10-CM | POA: Insufficient documentation

## 2018-05-01 LAB — CBC
HCT: 44 % (ref 36.0–46.0)
Hemoglobin: 14.1 g/dL (ref 12.0–15.0)
MCH: 25.1 pg — AB (ref 26.0–34.0)
MCHC: 32 g/dL (ref 30.0–36.0)
MCV: 78.4 fL — ABNORMAL LOW (ref 80.0–100.0)
Platelets: 343 10*3/uL (ref 150–400)
RBC: 5.61 MIL/uL — ABNORMAL HIGH (ref 3.87–5.11)
RDW: 15.8 % — ABNORMAL HIGH (ref 11.5–15.5)
WBC: 13.5 10*3/uL — AB (ref 4.0–10.5)
nRBC: 0 % (ref 0.0–0.2)

## 2018-05-01 LAB — URINALYSIS, COMPLETE (UACMP) WITH MICROSCOPIC
Bacteria, UA: NONE SEEN
Bilirubin Urine: NEGATIVE
Glucose, UA: NEGATIVE mg/dL
HGB URINE DIPSTICK: NEGATIVE
Ketones, ur: NEGATIVE mg/dL
Leukocytes, UA: NEGATIVE
Nitrite: NEGATIVE
Protein, ur: 30 mg/dL — AB
Specific Gravity, Urine: 1.015 (ref 1.005–1.030)
pH: 7 (ref 5.0–8.0)

## 2018-05-01 LAB — COMPREHENSIVE METABOLIC PANEL
ALT: 20 U/L (ref 0–44)
AST: 22 U/L (ref 15–41)
Albumin: 3.9 g/dL (ref 3.5–5.0)
Alkaline Phosphatase: 52 U/L (ref 38–126)
Anion gap: 10 (ref 5–15)
BUN: 17 mg/dL (ref 6–20)
CO2: 28 mmol/L (ref 22–32)
Calcium: 9.2 mg/dL (ref 8.9–10.3)
Chloride: 100 mmol/L (ref 98–111)
Creatinine, Ser: 0.76 mg/dL (ref 0.44–1.00)
GFR calc Af Amer: >60 mL/min (ref >60)
GFR calc non Af Amer: >60 mL/min (ref >60)
Glucose, Bld: 177 mg/dL — ABNORMAL HIGH (ref 70–99)
Potassium: 3.6 mmol/L (ref 3.5–5.1)
Sodium: 138 mmol/L (ref 135–145)
Total Bilirubin: 0.6 mg/dL (ref 0.3–1.2)
Total Protein: 7.9 g/dL (ref 6.5–8.1)

## 2018-05-01 LAB — LIPASE, BLOOD: Lipase: 35 U/L (ref 11–51)

## 2018-05-01 LAB — GLUCOSE, CAPILLARY: Glucose-Capillary: 140 mg/dL — ABNORMAL HIGH (ref 70–99)

## 2018-05-01 MED ORDER — SODIUM CHLORIDE 0.9 % IV BOLUS
1000.0000 mL | Freq: Once | INTRAVENOUS | Status: AC
  Administered 2018-05-01: 1000 mL via INTRAVENOUS

## 2018-05-01 NOTE — Discharge Instructions (Signed)
I think you may have had a little bit of food poisoning. You seem to be doing a lot better now.  Please return if you are worse.  This includes worse pain or much more diarrhea or fever.  If you have just 1 or 2 episodes of diarrhea try some Pepto-Bismol.

## 2018-05-01 NOTE — ED Triage Notes (Signed)
Pt to triage via wheelchair. Pt reports abd pain that started around 2 am. Reports 3 episodes of diarrhea. Pt also reports she spit up x 3.

## 2018-05-01 NOTE — ED Notes (Signed)
AAOx3.  Skin warm and dry.  NAD 

## 2018-05-01 NOTE — ED Notes (Signed)
First Nurse Note: Patient to Rm 1 via WC with Vernell Morgansracy Tech.  Erskine SquibbJane RN aware of bed placement.

## 2018-05-01 NOTE — ED Provider Notes (Addendum)
[facility] Emergency Department Provider Note  ____________________________________________   First MD Initiated Contact with Patient 05/01/18 [phone]     (approximate)  I have reviewed the triage vital signs and the nursing notes.   HISTORY  Chief Complaint Abdominal Pain and Diarrhea   HP Jennifer Miller is a 52 y.o. female who comes in complaining of diarrhea since about 2 this morning.  She says she is gone about 4 5 times.  She does not seem to have any more.  She has had intermittent abdominal pain it comes and goes since the onset of the diarrhea.  She did eat some different food than her husband but was things like a pastrami sandwich that she made at home.  The food tasted bad.  Patient is currently not having any need to have diarrhea not having any abdominal pain.  Past Medical History:  Diagnosis Date  . Diabetes mellitus without complication (HCC)   . Hypertension     Patient Active Problem List   Diagnosis Date Noted  . Controlled type 2 diabetes mellitus without complication, without long-term current use of insulin (HCC) 12/30/2017  . Essential hypertension 12/30/2017  . Iron deficiency anemia 12/30/2017  . PAF (paroxysmal atrial fibrillation) (HCC) 12/30/2017  . Acquired hypothyroidism 12/30/2017  . History of colon polyps 12/30/2017  . Hyperlipidemia 12/30/2017  . Congestive heart failure (HCC) 12/30/2017    History reviewed. No pertinent surgical history.  Prior to Admission medications   Medication Sig Start Date End Date Taking? Authorizing Provider  amLODipine (NORVASC) 5 MG tablet Take 1 tablet (5 mg total) by mouth daily. 02/09/18   Marcine MatarJohnson, Deborah B, MD  atorvastatin (LIPITOR) 20 MG tablet Take 1 tablet (20 mg total) by mouth daily. 12/29/17   Marcine MatarJohnson, Deborah B, MD  cholecalciferol (VITAMIN D) 400 units TABS tablet Take 1 tablet (400 Units total) by mouth daily. 12/29/17   Marcine MatarJohnson, Deborah B, MD  colchicine 0.6 MG tablet Take 1  tablet (0.6 mg total) by mouth daily. Take 1 tab daily for at least 6 more days. If  Symptoms persist past 6 days continue to use until prescription is finished 03/27/18   Cuthriell, Delorise RoyalsJonathan D, PA-C  dabigatran (PRADAXA) 150 MG CAPS capsule Take 1 capsule (150 mg total) by mouth 2 (two) times daily. 12/29/17   Marcine MatarJohnson, Deborah B, MD  ferrous sulfate 325 (65 FE) MG tablet Take 1 tablet (325 mg total) by mouth 2 (two) times daily with a meal. 12/29/17   Marcine MatarJohnson, Deborah B, MD  furosemide (LASIX) 40 MG tablet Take 1 tablet (40 mg total) by mouth daily. 03/28/18   Marcine MatarJohnson, Deborah B, MD  levothyroxine (SYNTHROID, LEVOTHROID) 100 MCG tablet Take 0.5 tablets (50 mcg total) by mouth daily before breakfast. Take 1/2 tablet by mouth dail y 12/29/17   Marcine MatarJohnson, Deborah B, MD  lisinopril (PRINIVIL,ZESTRIL) 10 MG tablet Take 1 tablet (10 mg total) by mouth daily. 12/29/17   Marcine MatarJohnson, Deborah B, MD  metFORMIN (GLUCOPHAGE) 1000 MG tablet Take 1 tablet (1,000 mg total) by mouth 2 (two) times daily with a meal. 12/29/17   Marcine MatarJohnson, Deborah B, MD  metoprolol tartrate (LOPRESSOR) 50 MG tablet Take 1 tablet (50 mg total) by mouth 2 (two) times daily. 12/29/17   Marcine MatarJohnson, Deborah B, MD  metroNIDAZOLE (FLAGYL) 500 MG tablet Take 1 tablet (500 mg total) by mouth 2 (two) times daily. 02/15/18   Marcine MatarJohnson, Deborah B, MD    Allergies Patient has no known allergies.  Family History  Problem Relation Age of Onset  . Diabetes Mother   . Hypertension Mother   . Dementia Mother   . Diabetes Father   . Hypertension Father   . Diabetes Maternal Grandmother   . Diabetes Paternal Grandmother   . Heart disease Paternal Grandmother     Social History Social History   Tobacco Use  . Smoking status: Former Games developermoker  . Smokeless tobacco: Never Used  Substance Use Topics  . Alcohol use: Yes    Comment: rarely  . Drug use: Never    Review of Systems  Constitutional: No fever/chills Eyes: No visual changes. ENT: No sore  throat. Cardiovascular: Denies chest pain. Respiratory: Denies shortness of breath. Gastrointestinal: No abdominal pain.  No nausea, no vomiting.  No diarrhea.  No constipation. Genitourinary: Negative for dysuria. Musculoskeletal: Negative for back pain. Skin: Negative for rash. Neurological: Negative for headaches, focal weakness   ____________________________________________   PHYSICAL EXAM:  VITAL SIGNS: ED Triage Vitals  Enc Vitals Group     BP 05/01/18 0450 (!) 153/88     Pulse Rate 05/01/18 0450 90     Resp 05/01/18 0450 16     Temp 05/01/18 0450 98.4 F (36.9 C)     Temp Source 05/01/18 0450 Oral     SpO2 05/01/18 0450 99 %     Weight 05/01/18 0456 230 lb (104.3 kg)     Height 05/01/18 0456 5\' 7"  (1.702 m)     Head Circumference --      Peak Flow --      Pain Score 05/01/18 0456 6     Pain Loc --      Pain Edu? --      Excl. in GC? --    Constitutional: Alert and oriented. Well appearing and in no acute distress. Eyes: Conjunctivae are normal. . Head: Atraumatic. Nose: No congestion/rhinnorhea. Mouth/Throat: Mucous membranes are moist.  Oropharynx non-erythematous. Neck: No stridor. Cardiovascular: Normal rate, regular rhythm. Grossly normal heart sounds.  Good peripheral circulation. Respiratory: Normal respiratory effort.  No retractions. Lungs CTAB. Gastrointestinal: Soft and nontender. No distention. No abdominal bruits. No CVA tenderness. Musculoskeletal: No lower extremity tenderness  Neurologic:  Normal speech and language. No gross focal neurologic deficits are appreciated Skin:  Skin is warm, dry and intact. No rash noted. Psychiatric: Mood and affect are normal. Speech and behavior are normal.  ____________________________________________   LABS (all labs ordered are listed, but only abnormal results are displayed)  Labs Reviewed  COMPREHENSIVE METABOLIC PANEL - Abnormal; Notable for the following components:      Result Value   Glucose, Bld  177 (*)    All other components within normal limits  CBC - Abnormal; Notable for the following components:   WBC 13.5 (*)    RBC 5.61 (*)    MCV 78.4 (*)    MCH 25.1 (*)    RDW 15.8 (*)    All other components within normal limits  URINALYSIS, COMPLETE (UACMP) WITH MICROSCOPIC - Abnormal; Notable for the following components:   Color, Urine YELLOW (*)    APPearance CLOUDY (*)    Protein, ur 30 (*)    All other components within normal limits  GLUCOSE, CAPILLARY - Abnormal; Notable for the following components:   Glucose-Capillary 140 (*)    All other components within normal limits  GASTROINTESTINAL PANEL BY PCR, STOOL (REPLACES STOOL CULTURE)  C DIFFICILE QUICK SCREEN W PCR REFLEX  LIPASE, BLOOD  POC URINE PREG, ED  CBG MONITORING, ED  ____________________________________________  EKG EKG read and interpreted by me shows normal sinus rhythm rate of 80 normal axis nonspecific ST-T wave changes  ____________________________________________  RADIOLOGY  ED MD interpretation:   Official radiology report(s): No results found.  ____________________________________________   PROCEDURES  Procedure(s) performed:   Procedures   ____________________________________________   INITIAL IMPRESSION / ASSESSMENT AND PLAN / ED COURSE   Discharge patient is having minimal pain and nothing to palpation.  She is not had any more diarrhea and no vomiting.  She is not running a fever.  It is unlikely she has appendicitis diverticulitis or any other sort of intra-abdominal pathology.  Most likely thing is resolving food poisoning.  I will let her go to return if she needs any further treatment.     ____________________________________________   FINAL CLINICAL IMPRESSION(S) / ED DIAGNOSES  Final diagnoses:  Diarrhea, unspecified type     ED Discharge Orders    None       Note:  This document was prepared using Dragon voice recognition software and may include  unintentional dictation errors.    Arnaldo Natal, MD 05/01/18 [id number]    Arnaldo Natal, MD 05/01/18 [phone]

## 2018-05-10 MED FILL — FUROSEMIDE 40 MG TAB: 40 | 30 days supply | Qty: 30 | Fill #1

## 2018-05-10 MED FILL — METOPROLOL TARTRATE 50 MG T: 50 | 30 days supply | Qty: 60 | Fill #3

## 2018-05-12 ENCOUNTER — Ambulatory Visit: Payer: Self-pay | Attending: Internal Medicine | Admitting: Internal Medicine

## 2018-05-12 ENCOUNTER — Encounter: Payer: Self-pay | Admitting: Internal Medicine

## 2018-05-12 VITALS — BP 167/111 | HR 69 | Temp 98.2°F | Resp 16 | Ht 67.0 in | Wt 230.6 lb

## 2018-05-12 DIAGNOSIS — E039 Hypothyroidism, unspecified: Secondary | ICD-10-CM

## 2018-05-12 DIAGNOSIS — I110 Hypertensive heart disease with heart failure: Secondary | ICD-10-CM | POA: Insufficient documentation

## 2018-05-12 DIAGNOSIS — E785 Hyperlipidemia, unspecified: Secondary | ICD-10-CM | POA: Insufficient documentation

## 2018-05-12 DIAGNOSIS — Z87891 Personal history of nicotine dependence: Secondary | ICD-10-CM | POA: Insufficient documentation

## 2018-05-12 DIAGNOSIS — E119 Type 2 diabetes mellitus without complications: Secondary | ICD-10-CM

## 2018-05-12 DIAGNOSIS — N888 Other specified noninflammatory disorders of cervix uteri: Secondary | ICD-10-CM

## 2018-05-12 DIAGNOSIS — Z1239 Encounter for other screening for malignant neoplasm of breast: Secondary | ICD-10-CM

## 2018-05-12 DIAGNOSIS — Z7984 Long term (current) use of oral hypoglycemic drugs: Secondary | ICD-10-CM | POA: Insufficient documentation

## 2018-05-12 DIAGNOSIS — E669 Obesity, unspecified: Secondary | ICD-10-CM | POA: Insufficient documentation

## 2018-05-12 DIAGNOSIS — Z833 Family history of diabetes mellitus: Secondary | ICD-10-CM | POA: Insufficient documentation

## 2018-05-12 DIAGNOSIS — Z7901 Long term (current) use of anticoagulants: Secondary | ICD-10-CM | POA: Insufficient documentation

## 2018-05-12 DIAGNOSIS — I480 Paroxysmal atrial fibrillation: Secondary | ICD-10-CM

## 2018-05-12 DIAGNOSIS — E1169 Type 2 diabetes mellitus with other specified complication: Secondary | ICD-10-CM

## 2018-05-12 DIAGNOSIS — I509 Heart failure, unspecified: Secondary | ICD-10-CM

## 2018-05-12 DIAGNOSIS — D509 Iron deficiency anemia, unspecified: Secondary | ICD-10-CM | POA: Insufficient documentation

## 2018-05-12 DIAGNOSIS — Z79899 Other long term (current) drug therapy: Secondary | ICD-10-CM | POA: Insufficient documentation

## 2018-05-12 DIAGNOSIS — Z6836 Body mass index (BMI) 36.0-36.9, adult: Secondary | ICD-10-CM | POA: Insufficient documentation

## 2018-05-12 DIAGNOSIS — Z8249 Family history of ischemic heart disease and other diseases of the circulatory system: Secondary | ICD-10-CM | POA: Insufficient documentation

## 2018-05-12 DIAGNOSIS — Z79890 Hormone replacement therapy (postmenopausal): Secondary | ICD-10-CM | POA: Insufficient documentation

## 2018-05-12 DIAGNOSIS — I10 Essential (primary) hypertension: Secondary | ICD-10-CM

## 2018-05-12 LAB — POCT GLYCOSYLATED HEMOGLOBIN (HGB A1C): HbA1c, POC (prediabetic range): 5.9 % (ref 5.7–6.4)

## 2018-05-12 LAB — GLUCOSE, POCT (MANUAL RESULT ENTRY): POC Glucose: 79 mg/dl (ref 70–99)

## 2018-05-12 MED ORDER — TRUE METRIX METER W/DEVICE KIT: PACK | 0 refills | Status: DC

## 2018-05-12 MED ORDER — GLUCOSE BLOOD VI STRP: ORAL_STRIP | 12 refills | Status: DC

## 2018-05-12 MED ORDER — TRUEPLUS LANCETS 28G MISC: 12 refills | Status: DC

## 2018-05-12 MED ORDER — FUROSEMIDE 40 MG PO TABS: 40.0000 mg | ORAL_TABLET | Freq: Every day | ORAL | 3 refills | Status: DC

## 2018-05-12 MED ORDER — DABIGATRAN ETEXILATE MESYLATE 150 MG PO CAPS: 150.0000 mg | ORAL_CAPSULE | Freq: Two times a day (BID) | ORAL | 6 refills | Status: DC

## 2018-05-12 MED FILL — PRADAXA 150 MG CAP: 150 | 30 days supply | Qty: 60 | Fill #0

## 2018-05-12 MED FILL — LISINOPRIL 10 MG TABS: 10 | 30 days supply | Qty: 30 | Fill #4

## 2018-05-12 MED FILL — TRUEplus LANCETS 28G MISC: 25 days supply | Qty: 100 | Fill #0

## 2018-05-12 MED FILL — TRUE METRIX TEST STRIP: 25 days supply | Qty: 100 | Fill #0

## 2018-05-12 MED FILL — AMLODIPINE BESYLATE 5 MG TA: 5 | 30 days supply | Qty: 30 | Fill #3

## 2018-05-12 MED FILL — !TRUE METRIX BLOOD GLUCOSE: 365 days supply | Qty: 1 | Fill #0

## 2018-05-12 MED FILL — LEVOTHYROXINE 100 MCG TAB: 100 | 30 days supply | Qty: 15 | Fill #3

## 2018-05-12 NOTE — Patient Instructions (Signed)
Please give appointment with clinical pharmacist in 2 weeks for repeat blood pressure check.

## 2018-05-12 NOTE — Progress Notes (Signed)
5.9 

## 2018-05-12 NOTE — Progress Notes (Signed)
Patient ID: Jennifer Miller, female    DOB: 11/02/1965  MRN: [id number]  CC: Hypertension and Diabetes   Subjective: Jennifer Miller is a 53 y.o. female who presents for chronic ds management Her concerns today include:  Pt with hx ofCHF, a.fib, HL,HTN, hypothyroid, IDA with hx of fibroid tumors, anxietyand DM.  PAP done last visit.  Referred to GYN to eval mass on cervix. She had to cancel appt with GYN because mother was sick at the time. Still gets light spotting ever few mths; she has not not gone a whole yr as yet without bleeding  HYPERTENSION/CHF/PAF Currently taking: see medication list Med Adherence: [x]  Yes, but out of Furosemide x 2 days    []  No Medication side effects: []  Yes    [x]  No Adherence with salt restriction: [x]  Yes, "I don't add salt but I had some buffalo wings this morning."    []  No Home Monitoring?: []  Yes    [x]  No but checks att [facility] sometimes Monitoring Frequency: []  Yes    []  No Home BP results range: []  Yes    []  No SOB? []  Yes    [x]  No Chest Pain?: []  Yes    [x]  No Leg swelling?: [x]  Yes    []  No Headaches?: [x]  Yes, occasionally    []  No Dizziness? []  Yes    [x]  No Comments: Denies any bruising on Pradaxa.  She would like to get the Pradaxa through our pharmacy.  DIABETES TYPE 2 Last A1C:   Results for orders placed or performed in visit on 05/12/18  POCT glucose (manual entry)  Result Value Ref Range   POC Glucose 79 70 - 99 mg/dl  POCT glycosylated hemoglobin (Hb A1C)  Result Value Ref Range   Hemoglobin A1C     HbA1c POC (<> result, manual entry)     HbA1c, POC (prediabetic range) 5.9 5.7 - 6.4 %   HbA1c, POC (controlled diabetic range)      Med Adherence:  [x]  Yes    []  No Medication side effects:  []  Yes    [x]  No Home Monitoring?  [x]  Yes 1-2 x a day   []  No Home glucose results range:120s Diet Adherence: []  Yes    [x]  No drinks regular sodas about 3 x a wk.  However, she does limit size of white carbs.  Love pasta Exercise:  [x]  Yes, does a lot of walking up and down at work.  Loss 12 lbs 12/2017. Wants to lose more    []  No Hypoglycemic episodes?: []  Yes    [x]  No Numbness of the feet? [x]  Yes, in hands   []  No Retinopathy hx? []  Yes    [x]  No Last eye exam: plans t get eye exam in 07/2018 Comments:    Patient Active Problem List   Diagnosis Date Noted  . Controlled type 2 diabetes mellitus without complication, without long-term current use of insulin (HCC) 12/30/2017  . Essential hypertension 12/30/2017  . Iron deficiency anemia 12/30/2017  . PAF (paroxysmal atrial fibrillation) (HCC) 12/30/2017  . Acquired hypothyroidism 12/30/2017  . History of colon polyps 12/30/2017  . Hyperlipidemia 12/30/2017  . Congestive heart failure (HCC) 12/30/2017     Current Outpatient Medications on File Prior to Visit  Medication Sig Dispense Refill  . amLODipine (NORVASC) 5 MG tablet Take 1 tablet (5 mg total) by mouth daily. 90 tablet 3  . atorvastatin (LIPITOR) 20 MG tablet Take 1 tablet (20 mg total) by mouth daily. 30  tablet 6  . cholecalciferol (VITAMIN D) 400 units TABS tablet Take 1 tablet (400 Units total) by mouth daily. 100 each 1  . colchicine 0.6 MG tablet Take 1 tablet (0.6 mg total) by mouth daily. Take 1 tab daily for at least 6 more days. If  Symptoms persist past 6 days continue to use until prescription is finished 20 tablet 0  . dabigatran (PRADAXA) 150 MG CAPS capsule Take 1 capsule (150 mg total) by mouth 2 (two) times daily. 60 capsule 6  . ferrous sulfate 325 (65 FE) MG tablet Take 1 tablet (325 mg total) by mouth 2 (two) times daily with a meal. 120 tablet 3  . furosemide (LASIX) 40 MG tablet Take 1 tablet (40 mg total) by mouth daily. 30 tablet 3  . levothyroxine (SYNTHROID, LEVOTHROID) 100 MCG tablet Take 0.5 tablets (50 mcg total) by mouth daily before breakfast. Take 1/2 tablet by mouth dail y 15 tablet 6  . lisinopril (PRINIVIL,ZESTRIL) 10 MG tablet Take 1 tablet (10 mg total) by mouth daily. 30  tablet 6  . metFORMIN (GLUCOPHAGE) 1000 MG tablet Take 1 tablet (1,000 mg total) by mouth 2 (two) times daily with a meal. 60 tablet 6  . metoprolol tartrate (LOPRESSOR) 50 MG tablet Take 1 tablet (50 mg total) by mouth 2 (two) times daily. 60 tablet 6  . metroNIDAZOLE (FLAGYL) 500 MG tablet Take 1 tablet (500 mg total) by mouth 2 (two) times daily. (Patient not taking: Reported on 05/12/2018) 14 tablet 0   No current facility-administered medications on file prior to visit.     No Known Allergies  Social History   Socioeconomic History  . Marital status: Single    Spouse name: Not on file  . Number of children: 3  . Years of education: Not on file  . Highest education level: Not on file  Occupational History  . Occupation: unemployed  Social Needs  . Financial resource strain: Not on file  . Food insecurity:    Worry: Not on file    Inability: Not on file  . Transportation needs:    Medical: Not on file    Non-medical: Not on file  Tobacco Use  . Smoking status: Former Games developer  . Smokeless tobacco: Never Used  Substance and Sexual Activity  . Alcohol use: Yes    Comment: rarely  . Drug use: Never  . Sexual activity: Not on file  Lifestyle  . Physical activity:    Days per week: Not on file    Minutes per session: Not on file  . Stress: Not on file  Relationships  . Social connections:    Talks on phone: Not on file    Gets together: Not on file    Attends religious service: Not on file    Active member of club or organization: Not on file    Attends meetings of clubs or organizations: Not on file    Relationship status: Not on file  . Intimate partner violence:    Fear of current or ex partner: Not on file    Emotionally abused: Not on file    Physically abused: Not on file    Forced sexual activity: Not on file  Other Topics Concern  . Not on file  Social History Narrative  . Not on file    Family History  Problem Relation Age of Onset  . Diabetes Mother    . Hypertension Mother   . Dementia Mother   . Diabetes Father   .  Hypertension Father   . Diabetes Maternal Grandmother   . Diabetes Paternal Grandmother   . Heart disease Paternal Grandmother     No past surgical history on file.  ROS: Review of Systems Negative except as above PHYSICAL EXAM: BP (!) 167/111 (BP Location: Right Arm, Cuff Size: Normal) Comment: recheck  Pulse 69   Temp 98.2 F (36.8 C) (Oral)   Resp 16   Ht 5\' 7"  (1.702 m)   Wt 230 lb 9.6 oz (104.6 kg)   SpO2 96%   BMI 36.12 kg/m   Wt Readings from Last 3 Encounters:  05/12/18 230 lb 9.6 oz (104.6 kg)  05/01/18 230 lb (104.3 kg)  03/27/18 230 lb (104.3 kg)   Results for orders placed or performed in visit on 05/12/18  POCT glucose (manual entry)  Result Value Ref Range   POC Glucose 79 70 - 99 mg/dl  POCT glycosylated hemoglobin (Hb A1C)  Result Value Ref Range   Hemoglobin A1C     HbA1c POC (<> result, manual entry)     HbA1c, POC (prediabetic range) 5.9 5.7 - 6.4 %   HbA1c, POC (controlled diabetic range)      Physical Exam  General appearance - alert, well appearing, and in no distress Mental status - normal mood, behavior, speech, dress, motor activity, and thought processes Neck - supple, no significant adenopathy Chest - clear to auscultation, no wheezes, rales or rhonchi, symmetric air entry Heart - normal rate, regular rhythm, normal S1, S2, no murmurs, rubs, clicks or gallops Extremities - peripheral pulses normal, no pedal edema, no clubbing or cyanosis Diabetic Foot Exam - Simple   Simple Foot Form Visual Inspection No deformities, no ulcerations, no other skin breakdown bilaterally:  Yes Sensation Testing Intact to touch and monofilament testing bilaterally:  Yes Pulse Check Posterior Tibialis and Dorsalis pulse intact bilaterally:  Yes Comments     ASSESSMENT AND PLAN:  1. Controlled type 2 diabetes mellitus without complication, without long-term current use of insulin  (HCC) Continue metformin.  Healthy eating habits discussed and encouraged.  Commended her for being more active. - POCT glucose (manual entry) - POCT glycosylated hemoglobin (Hb A1C)  2. Type 2 diabetes mellitus with obesity (HCC) Commended her on weight loss so far.  Encouraged her to stay active.  Advised to do away with sugary drinks.  3. Chronic congestive heart failure, unspecified heart failure type (HCC) Clinically stable and compensated.  Continue current medications.  Refill furosemide.  4. PAF (paroxysmal atrial fibrillation) (HCC) Stable.  Currently she is in normal sinus rhythm on auscultation - dabigatran (PRADAXA) 150 MG CAPS capsule; Take 1 capsule (150 mg total) by mouth 2 (two) times daily.  Dispense: 60 capsule; Refill: 6  5. Essential hypertension Not at goal.  Refill furosemide.  Continue metoprolol, lisinopril and amlodipine Follow-up with clinical pharmacist in 2 weeks for repeat blood pressure check 6. Cervical mass Will reschedule with gynecology - Ambulatory referral to Gynecology  7. Breast cancer screening   8. Acquired hypothyroidism - TSH    Patient was given the opportunity to ask questions.  Patient verbalized understanding of the plan and was able to repeat key elements of the plan.   Orders Placed This Encounter  Procedures  . POCT glucose (manual entry)  . POCT glycosylated hemoglobin (Hb A1C)     Requested Prescriptions    No prescriptions requested or ordered in this encounter    No follow-ups on file.  Jonah Blueeborah Johnson, MD, FACP

## 2018-05-13 LAB — TSH: TSH: 1.92 u[IU]/mL (ref 0.450–4.500)

## 2018-05-23 ENCOUNTER — Telehealth: Payer: Self-pay

## 2018-05-23 NOTE — Telephone Encounter (Signed)
Contacted pt to go over lab results pt is aware and doesn't have any questions or concerns 

## 2018-05-30 ENCOUNTER — Encounter: Payer: Medicaid Other | Admitting: Pharmacist

## 2018-06-15 MED FILL — AMLODIPINE BESYLATE 5 MG TA: 5 | 30 days supply | Qty: 30 | Fill #4

## 2018-06-15 MED FILL — LISINOPRIL 10 MG TABS: 10 | 30 days supply | Qty: 30 | Fill #5

## 2018-06-15 MED FILL — FUROSEMIDE 40 MG TAB: 40 | 30 days supply | Qty: 30 | Fill #2

## 2018-06-15 MED FILL — METOPROLOL TARTRATE 50 MG T: 50 | 30 days supply | Qty: 60 | Fill #4

## 2018-06-30 MED FILL — LEVOTHYROXINE 100 MCG TAB: 100 | 30 days supply | Qty: 15 | Fill #4

## 2018-06-30 MED FILL — PRADAXA 150 MG CAP: 150 | 30 days supply | Qty: 60 | Fill #1

## 2018-07-03 ENCOUNTER — Emergency Department: Payer: Medicaid Other

## 2018-07-03 ENCOUNTER — Encounter: Payer: Self-pay | Admitting: Emergency Medicine

## 2018-07-03 ENCOUNTER — Emergency Department
Admission: EM | Admit: 2018-07-03 | Discharge: 2018-07-03 | Disposition: A | Discharge status: Home / Self Care | Payer: Medicaid Other | Attending: Emergency Medicine | Admitting: Emergency Medicine

## 2018-07-03 DIAGNOSIS — Z7984 Long term (current) use of oral hypoglycemic drugs: Secondary | ICD-10-CM | POA: Insufficient documentation

## 2018-07-03 DIAGNOSIS — R0789 Other chest pain: Secondary | ICD-10-CM

## 2018-07-03 DIAGNOSIS — Z7901 Long term (current) use of anticoagulants: Secondary | ICD-10-CM | POA: Insufficient documentation

## 2018-07-03 DIAGNOSIS — I509 Heart failure, unspecified: Secondary | ICD-10-CM | POA: Insufficient documentation

## 2018-07-03 DIAGNOSIS — E039 Hypothyroidism, unspecified: Secondary | ICD-10-CM | POA: Insufficient documentation

## 2018-07-03 DIAGNOSIS — Z79899 Other long term (current) drug therapy: Secondary | ICD-10-CM | POA: Insufficient documentation

## 2018-07-03 DIAGNOSIS — I110 Hypertensive heart disease with heart failure: Secondary | ICD-10-CM | POA: Insufficient documentation

## 2018-07-03 DIAGNOSIS — E119 Type 2 diabetes mellitus without complications: Secondary | ICD-10-CM | POA: Insufficient documentation

## 2018-07-03 LAB — CBC
HCT: 41.2 % (ref 36.0–46.0)
Hemoglobin: 13.2 g/dL (ref 12.0–15.0)
MCH: 25.7 pg — ABNORMAL LOW (ref 26.0–34.0)
MCHC: 32 g/dL (ref 30.0–36.0)
MCV: 80.3 fL (ref 80.0–100.0)
NRBC: 0 % (ref 0.0–0.2)
Platelets: 329 10*3/uL (ref 150–400)
RBC: 5.13 MIL/uL — ABNORMAL HIGH (ref 3.87–5.11)
RDW: 15.9 % — ABNORMAL HIGH (ref 11.5–15.5)
WBC: 5.3 10*3/uL (ref 4.0–10.5)

## 2018-07-03 LAB — BASIC METABOLIC PANEL
ANION GAP: 7 (ref 5–15)
BUN: 13 mg/dL (ref 6–20)
CO2: 30 mmol/L (ref 22–32)
Calcium: 9.8 mg/dL (ref 8.9–10.3)
Chloride: 102 mmol/L (ref 98–111)
Creatinine, Ser: 0.64 mg/dL (ref 0.44–1.00)
GFR calc non Af Amer: >60 mL/min (ref >60)
Glucose, Bld: 154 mg/dL — ABNORMAL HIGH (ref 70–99)
Potassium: 3.9 mmol/L (ref 3.5–5.1)
Sodium: 139 mmol/L (ref 135–145)

## 2018-07-03 LAB — TROPONIN I

## 2018-07-03 NOTE — ED Triage Notes (Signed)
Pt reports intermittent cp for 2 days. Denies any other symptom.

## 2018-07-03 NOTE — ED Notes (Signed)
First Nurse Note: Patient complaining of chest pain X 2 days, here this AM because she states "I just can't take it anymore" pain worse with deep breaths.

## 2018-07-03 NOTE — ED Provider Notes (Signed)
[facility] Emergency Department Provider Note   ____________________________________________    I have reviewed the triage vital signs and the nursing notes.   HISTORY  Chief Complaint Chest Pain     HPI Jennifer Miller is a 53 y.o. female who presents with complaints of left lateral point tenderness beneath her left breast for 2 days.  She reports it is tender to palpation and worse if she takes a deep breath.  No rash or injury to the area.  No fevers or chills no significant cough.  No recent travel.  No calf pain or swelling.  She is on Pradaxa for reported atrial fibrillation.  No shortness of breath.  Otherwise feels well.  Has not taken any pain medications.  No radiation  Past Medical History:  Diagnosis Date  . Diabetes mellitus without complication ([place])   . Hypertension     Patient Active Problem List   Diagnosis Date Noted  . Controlled type 2 diabetes mellitus without complication, without long-term current use of insulin ([place]) 12/30/2017  . Essential hypertension 12/30/2017  . Iron deficiency anemia 12/30/2017  . PAF (paroxysmal atrial fibrillation) ([place]) 12/30/2017  . Acquired hypothyroidism 12/30/2017  . History of colon polyps 12/30/2017  . Hyperlipidemia 12/30/2017  . Congestive heart failure ([place]) 12/30/2017    History reviewed. No pertinent surgical history.  Prior to Admission medications   Medication Sig Start Date End Date Taking? Authorizing Provider  amLODipine (NORVASC) 5 MG tablet Take 1 tablet (5 mg total) by mouth daily. 02/09/18   Ladell Pier, MD  atorvastatin (LIPITOR) 20 MG tablet Take 1 tablet (20 mg total) by mouth daily. 12/29/17   Ladell Pier, MD  Blood Glucose Monitoring Suppl (TRUE METRIX METER) w/Device KIT Use as directed 05/12/18   Ladell Pier, MD  cholecalciferol (VITAMIN D) 400 units TABS tablet Take 1 tablet (400 Units total) by mouth daily. 12/29/17   Ladell Pier, MD    colchicine 0.6 MG tablet Take 1 tablet (0.6 mg total) by mouth daily. Take 1 tab daily for at least 6 more days. If  Symptoms persist past 6 days continue to use until prescription is finished 03/27/18   Cuthriell, Charline Bills, PA-C  dabigatran (PRADAXA) 150 MG CAPS capsule Take 1 capsule (150 mg total) by mouth 2 (two) times daily. 05/12/18   Ladell Pier, MD  ferrous sulfate 325 (65 FE) MG tablet Take 1 tablet (325 mg total) by mouth 2 (two) times daily with a meal. 12/29/17   Ladell Pier, MD  furosemide (LASIX) 40 MG tablet Take 1 tablet (40 mg total) by mouth daily. 05/12/18   Ladell Pier, MD  glucose blood (TRUE METRIX BLOOD GLUCOSE TEST) test strip Check blood sugar fasting and before meals and again if pt feels bad (symptoms of hypo). 05/12/18   Ladell Pier, MD  levothyroxine (SYNTHROID, LEVOTHROID) 100 MCG tablet Take 0.5 tablets (50 mcg total) by mouth daily before breakfast. Take 1/2 tablet by mouth dail y 12/29/17   Ladell Pier, MD  lisinopril (PRINIVIL,ZESTRIL) 10 MG tablet Take 1 tablet (10 mg total) by mouth daily. 12/29/17   Ladell Pier, MD  metFORMIN (GLUCOPHAGE) 1000 MG tablet Take 1 tablet (1,000 mg total) by mouth 2 (two) times daily with a meal. 12/29/17   Ladell Pier, MD  metoprolol tartrate (LOPRESSOR) 50 MG tablet Take 1 tablet (50 mg total) by mouth 2 (two) times daily. 12/29/17   Karle Plumber  B, MD  TRUEPLUS LANCETS 28G MISC Check blood sugar fasting and before meals and again if pt feels bad (symptoms of hypo). 05/12/18   Ladell Pier, MD     Allergies Patient has no known allergies.  Family History  Problem Relation Age of Onset  . Diabetes Mother   . Hypertension Mother   . Dementia Mother   . Diabetes Father   . Hypertension Father   . Diabetes Maternal Grandmother   . Diabetes Paternal Grandmother   . Heart disease Paternal Grandmother     Social History Social History   Tobacco Use  . Smoking status:  Former Research scientist (life sciences)  . Smokeless tobacco: Never Used  Substance Use Topics  . Alcohol use: Yes    Comment: rarely  . Drug use: Never    Review of Systems  Constitutional: No fever/chills Eyes: No visual changes.  ENT: No neck pain Cardiovascular: As above Respiratory: Denies shortness of breath. Gastrointestinal: No abdominal pain.  No nausea, no vomiting.   Genitourinary: Negative for dysuria. Musculoskeletal: Negative for back pain. Skin: Negative for rash. Neurological: Negative for headaches    ____________________________________________   PHYSICAL EXAM:  VITAL SIGNS: ED Triage Vitals  Enc Vitals Group     BP 07/03/18 1257 (!) 141/92     Pulse Rate 07/03/18 1052 79     Resp 07/03/18 1052 20     Temp 07/03/18 1052 97.9 F (36.6 C)     Temp Source 07/03/18 1052 Oral     SpO2 07/03/18 1052 98 %     Weight 07/03/18 1053 106.1 kg (234 lb)     Height 07/03/18 1053 1.702 m (_0 )     Head Circumference --      Peak Flow --      Pain Score 07/03/18 1053 8     Pain Loc --      Pain Edu? --      Excl. in [place]? --     Constitutional: Alert and oriented. No acute distress. Pleasant and interactive  Nose: No congestion/rhinnorhea. Mouth/Throat: Mucous membranes are moist.    Cardiovascular: Normal rate, regular rhythm. Grossly normal heart sounds.  Good peripheral circulation.  Point tenderness palpation just beneath the left breast, no rash erythema bruising or swelling Respiratory: Normal respiratory effort.  No retractions. Lungs CTAB. Gastrointestinal: Soft and nontender. No distention.  .  Musculoskeletal: No lower extremity tenderness nor edema.  Warm and well perfused Neurologic:  Normal speech and language. No gross focal neurologic deficits are appreciated.  Skin:  Skin is warm, dry and intact. No rash noted. Psychiatric: Mood and affect are normal. Speech and behavior are normal.  ____________________________________________   LABS (all labs ordered are  listed, but only abnormal results are displayed)  Labs Reviewed  BASIC METABOLIC PANEL - Abnormal; Notable for the following components:      Result Value   Glucose, Bld 154 (*)    All other components within normal limits  CBC - Abnormal; Notable for the following components:   RBC 5.13 (*)    MCH 25.7 (*)    RDW 15.9 (*)    All other components within normal limits  TROPONIN I  POC URINE PREG, ED   ____________________________________________  EKG  ED ECG REPORT I, Lavonia Drafts, the attending physician, personally viewed and interpreted this ECG.  Date: 07/03/2018  Rhythm: normal sinus rhythm QRS Axis: normal Intervals: normal ST/T Wave abnormalities: normal Narrative Interpretation: no evidence of acute ischemia  ____________________________________________  RADIOLOGY  Chest x-ray unremarkable ____________________________________________   PROCEDURES  Procedure(s) performed: No  Procedures   Critical Care performed: No ____________________________________________   INITIAL IMPRESSION / ASSESSMENT AND PLAN / ED COURSE  Pertinent labs & imaging results that were available during my care of the patient were reviewed by me and considered in my medical decision making (see chart for details).  Patient is quite well-appearing and in no acute distress, exam is very reassuring, point tenderness under the left breast very low risk for ACS, PE, dissection.  In fact she is on Pradaxa making the risk of PE even less.  Her EKG is normal, troponin is normal.  Chest x-ray is benign.  Recommended trialing pain medication to see if this helps with her symptoms.  Follow-up with cardiology, strict return precautions if any change or worsening in symptoms    ____________________________________________   FINAL CLINICAL IMPRESSION(S) / ED DIAGNOSES  Final diagnoses:  Atypical chest pain        Note:  This document was prepared using Dragon voice recognition  software and may include unintentional dictation errors.   Lavonia Drafts, MD 07/03/18 1315

## 2018-07-03 NOTE — ED Notes (Signed)
First Nurse Note: Patient pulled to triage Rm 1 for EKG.

## 2018-07-03 NOTE — ED Triage Notes (Signed)
This RN completed part of the triage note under [place], NT log in.

## 2018-07-19 MED FILL — LISINOPRIL 10 MG TABS: 10 | 30 days supply | Qty: 30 | Fill #6

## 2018-07-19 MED FILL — AMLODIPINE BESYLATE 5 MG TA: 5 | 30 days supply | Qty: 30 | Fill #5

## 2018-07-19 MED FILL — FUROSEMIDE 40 MG TAB: 40 | 30 days supply | Qty: 30 | Fill #3

## 2018-07-25 MED FILL — METOPROLOL TARTRATE 50 MG T: 50 | 30 days supply | Qty: 60 | Fill #5

## 2018-08-15 ENCOUNTER — Other Ambulatory Visit: Payer: Self-pay

## 2018-08-15 ENCOUNTER — Encounter: Payer: Self-pay | Admitting: Internal Medicine

## 2018-08-15 ENCOUNTER — Ambulatory Visit: Payer: Medicaid Other | Attending: Internal Medicine | Admitting: Internal Medicine

## 2018-08-15 DIAGNOSIS — N920 Excessive and frequent menstruation with regular cycle: Secondary | ICD-10-CM

## 2018-08-15 DIAGNOSIS — E039 Hypothyroidism, unspecified: Secondary | ICD-10-CM

## 2018-08-15 DIAGNOSIS — I10 Essential (primary) hypertension: Secondary | ICD-10-CM

## 2018-08-15 DIAGNOSIS — N888 Other specified noninflammatory disorders of cervix uteri: Secondary | ICD-10-CM

## 2018-08-15 DIAGNOSIS — E669 Obesity, unspecified: Secondary | ICD-10-CM

## 2018-08-15 DIAGNOSIS — E119 Type 2 diabetes mellitus without complications: Secondary | ICD-10-CM

## 2018-08-15 DIAGNOSIS — I509 Heart failure, unspecified: Secondary | ICD-10-CM

## 2018-08-15 MED ORDER — LISINOPRIL 10 MG PO TABS: 10.0000 mg | ORAL_TABLET | Freq: Every day | ORAL | 1 refills | Status: DC

## 2018-08-15 MED ORDER — ALBUTEROL SULFATE HFA 108 (90 BASE) MCG/ACT IN AERS: 1.0000 | INHALATION_SPRAY | Freq: Four times a day (QID) | RESPIRATORY_TRACT | 3 refills | Status: DC | PRN

## 2018-08-15 MED ORDER — CHOLECALCIFEROL 10 MCG (400 UNIT) PO TABS: 400.0000 [IU] | ORAL_TABLET | Freq: Every day | ORAL | 1 refills | Status: DC

## 2018-08-15 MED ORDER — FLUTICASONE PROPIONATE 50 MCG/ACT NA SUSP: 1.0000 | Freq: Every day | NASAL | 6 refills | Status: DC

## 2018-08-15 MED ORDER — METOPROLOL TARTRATE 50 MG PO TABS: 50.0000 mg | ORAL_TABLET | Freq: Two times a day (BID) | ORAL | 1 refills | Status: DC

## 2018-08-15 MED ORDER — FUROSEMIDE 40 MG PO TABS: 40.0000 mg | ORAL_TABLET | Freq: Every day | ORAL | 1 refills | Status: DC

## 2018-08-15 MED FILL — ALBUTEROL SULFATE HFA 108 (: 108 (90 BAS | 25 days supply | Qty: 18 | Fill #0

## 2018-08-15 MED FILL — LISINOPRIL 10 MG TABS: 10 | 30 days supply | Qty: 30 | Fill #0

## 2018-08-15 MED FILL — FLUTICASONE PROP 50 MCG SPR: 50 | 30 days supply | Qty: 16 | Fill #0

## 2018-08-15 MED FILL — FUROSEMIDE 40 MG TAB: 40 | 30 days supply | Qty: 30 | Fill #0

## 2018-08-15 MED FILL — METOPROLOL TARTRATE 50 MG T: 50 | 90 days supply | Qty: 180 | Fill #0

## 2018-08-15 NOTE — Progress Notes (Signed)
Needs RF on lisinopril, metoprolol, furosemide also inhaler Has needed to use the Ventolin and Flonase. She got this when she was in [place]  Blood sugars- has not taken metformin b/c blood sugar have been in the 90's.  She checks blood sugars BID.  Has sometimes she deals with anxiety

## 2018-08-15 NOTE — Progress Notes (Signed)
Virtual Visit via Telephone Note  I connected with Jennifer PrudeJohnna Stroupe on 08/15/18 at 2:17 p.m by telephone from my office and verified that I am speaking with the correct person using two identifiers. Pt is at home.   I discussed the limitations, risks, security and privacy concerns of performing an evaluation and management service by telephone and the availability of in person appointments. I also discussed with the patient that there may be a patient responsible charge related to this service. The patient expressed understanding and agreed to proceed.   History of Present Illness: Pt with hx ofCHF, a.fib, HL,HTN, hypothyroid, IDAwith hx of fibroid tumors, anxietyand DM.  DIABETES TYPE 2 Last A1C:   Results for orders placed or performed during the hospital encounter of 07/03/18  Basic metabolic panel  Result Value Ref Range   Sodium 139 135 - 145 mmol/L   Potassium 3.9 3.5 - 5.1 mmol/L   Chloride 102 98 - 111 mmol/L   CO2 30 22 - 32 mmol/L   Glucose, Bld 154 (H) 70 - 99 mg/dL   BUN 13 6 - 20 mg/dL   Creatinine, Ser 1.610.64 0.44 - 1.00 mg/dL   Calcium 9.8 8.9 - 09.610.3 mg/dL   GFR calc non Af Amer >60 >60 mL/min   GFR calc Af Amer >60 >60 mL/min   Anion gap 7 5 - 15  CBC  Result Value Ref Range   WBC 5.3 4.0 - 10.5 K/uL   RBC 5.13 (H) 3.87 - 5.11 MIL/uL   Hemoglobin 13.2 12.0 - 15.0 g/dL   HCT 04.541.2 40.936.0 - 81.146.0 %   MCV 80.3 80.0 - 100.0 fL   MCH 25.7 (L) 26.0 - 34.0 pg   MCHC 32.0 30.0 - 36.0 g/dL   RDW 91.415.9 (H) 78.211.5 - 95.615.5 %   Platelets 329 150 - 400 K/uL   nRBC 0.0 0.0 - 0.2 %  Troponin I - ONCE - STAT  Result Value Ref Range   Troponin I <0.03 <0.03 ng/mL    Med Adherence:  Stopped Metformin 1 mth ago because BS were dropping into the 70 Home Monitoring?  [x]  Yes, twice a day    []  No Home glucose results range:90-120 Diet Adherence: []  Yes    [x]  No.  She was binge eating but stopped.  Now eating 1-2 meals a day and a snack.  Gained about 5 lbs since last visit with me.   Current wgh 236 lbs. Exercise: []  Yes    [x]  No, was walking all day at work but no longer working due to the COVID-19 pandemic.  Now goes up and down stairs in her house several times a day.  Has some wghs at home which she plans to start using and also plans to start doing some daily core exercises.  She does not like going outside because she gets a feeling of tightness around her neck when she does.  She is not sure whether it is due to anxiety  Hypoglycemic episodes?: [x]  Yes, until she d/c Metformin     Numbness of the feet? []  Yes    [x]  No but some soreness in toes RT foot Retinopathy hx? []  Yes    [x]  No Last eye exam:  Over due for eye exam Comments:    HTN/A.fib/CHF:  Compliant with meds.  No device to check BP.  Limits salt in foods No HA/dizziness.  Occasional sharp CP.  No SOB/LE edema/PND/orthopnea/palpitations Skips Pradaxa sometimes when she is on her menses because it  makes flow too heavy.  Menses usually heavy, she has known hx of fibroids.  Referred to gynecology last year and again on her visit with me in January for evaluation of cervical mass and heavy menses.  Never saw GYN because she misplaced the phone number.    Hypothyroid:  Compliant with Levothyroxine.  Last TSH 05/2018 was nl.  No palpitations  Observations/Objective: Weight reported by patient is 236 pounds. No direct observation done as this was a telephone encounter.  Lab Results  Component Value Date   WBC 5.3 07/03/2018   HGB 13.2 07/03/2018   HCT 41.2 07/03/2018   MCV 80.3 07/03/2018   PLT 329 07/03/2018     Chemistry      Component Value Date/Time   NA 139 07/03/2018 1055   NA 142 12/29/2017 1035   K 3.9 07/03/2018 1055   CL 102 07/03/2018 1055   CO2 30 07/03/2018 1055   BUN 13 07/03/2018 1055   BUN 11 12/29/2017 1035   CREATININE 0.64 07/03/2018 1055      Component Value Date/Time   CALCIUM 9.8 07/03/2018 1055   ALKPHOS 52 05/01/2018 0506   AST 22 05/01/2018 0506   ALT 20 05/01/2018  0506   BILITOT 0.6 05/01/2018 0506   BILITOT 0.3 12/29/2017 1035      Assessment and Plan: 1. Controlled type 2 diabetes mellitus without complication, without long-term current use of insulin (HCC) Stop metformin since blood sugars are controlled without it.  Commended her on changes made in diet so far and encouraged her to continue healthy eating habits.  Encourage regular exercise at least several days a week. Advised to schedule an eye exam with an ophthalmologist or optometrist  2. Obesity (BMI 30-39.9) See #1 above  3. Essential hypertension Continue current medications and DASH diet - metoprolol tartrate (LOPRESSOR) 50 MG tablet; Take 1 tablet (50 mg total) by mouth 2 (two) times daily.  Dispense: 180 tablet; Refill: 1 - lisinopril (PRINIVIL,ZESTRIL) 10 MG tablet; Take 1 tablet (10 mg total) by mouth daily.  Dispense: 90 tablet; Refill: 1  4. Chronic congestive heart failure, unspecified heart failure type (HCC) Compensated.  Continue to limit salt in the foods - metoprolol tartrate (LOPRESSOR) 50 MG tablet; Take 1 tablet (50 mg total) by mouth 2 (two) times daily.  Dispense: 180 tablet; Refill: 1 - furosemide (LASIX) 40 MG tablet; Take 1 tablet (40 mg total) by mouth daily.  Dispense: 90 tablet; Refill: 1 - lisinopril (PRINIVIL,ZESTRIL) 10 MG tablet; Take 1 tablet (10 mg total) by mouth daily.  Dispense: 90 tablet; Refill: 1  5. Acquired hypothyroidism Continue levothyroxine  6. Cervical mass 7. Menorrhagia with regular cycle Encouraged her to see a gynecologist.  She wanted me to give her the phone number that she had misplaced but I do not have that information available and it has been more than 3 months since the referral was submitted.  She is agreeable for me to resubmit the referral - Ambulatory referral to Gynecology   Follow Up Instructions: F/u in 2-3 mths  I discussed the assessment and treatment plan with the patient. The patient was provided an opportunity to  ask questions and all were answered. The patient agreed with the plan and demonstrated an understanding of the instructions.   The patient was advised to call back or seek an in-person evaluation if the symptoms worsen or if the condition fails to improve as anticipated.  I provided 19 minutes of non-face-to-face time during this encounter.  Karle Plumber, MD

## 2018-08-18 MED FILL — LEVOTHYROXINE 100 MCG TAB: 100 | 30 days supply | Qty: 15 | Fill #5

## 2018-08-18 MED FILL — AMLODIPINE BESYLATE 5 MG TA: 5 | 30 days supply | Qty: 30 | Fill #6

## 2018-08-28 ENCOUNTER — Other Ambulatory Visit: Payer: Self-pay

## 2018-08-28 ENCOUNTER — Ambulatory Visit: Payer: Medicaid Other | Attending: Internal Medicine | Admitting: Internal Medicine

## 2018-08-28 DIAGNOSIS — M549 Dorsalgia, unspecified: Secondary | ICD-10-CM | POA: Diagnosis not present

## 2018-08-28 DIAGNOSIS — F411 Generalized anxiety disorder: Secondary | ICD-10-CM

## 2018-08-28 DIAGNOSIS — I509 Heart failure, unspecified: Secondary | ICD-10-CM | POA: Diagnosis not present

## 2018-08-28 DIAGNOSIS — I480 Paroxysmal atrial fibrillation: Secondary | ICD-10-CM | POA: Diagnosis not present

## 2018-08-28 MED ORDER — SERTRALINE HCL 50 MG PO TABS: ORAL_TABLET | ORAL | 1 refills | Status: DC

## 2018-08-28 MED FILL — SERTRALINE HCL 50 MG TABS: 50 | 60 days supply | Qty: 60 | Fill #0

## 2018-08-28 NOTE — Progress Notes (Signed)
Virtual Visit via Telephone Note  I connected with Jennifer Miller on 08/28/18 at 3:40 p.m by telephone from my office and verified that I am speaking with the correct person using two identifiers. Pt is in her car. Only pt and myself participated on this telephone visit   I discussed the limitations, risks, security and privacy concerns of performing an evaluation and management service by telephone and the availability of in person appointments. I also discussed with the patient that there may be a patient responsible charge related to this service. The patient expressed understanding and agreed to proceed.   History of Present Illness: Pt with hx ofCHF, a.fib, HL,HTN, hypothyroid, IDAwith hx of fibroid tumors, anxietyand DM.  Pt c/o burning and stinging sensation in her back that started last wk; sometimes across lower and other times across upper back. When she drinks something "it feels like it goes straight to my back."  She wonders whether CHF flaring. No LE/PND/orthopnea/SOB/coughing. Today she reports that the burning and stinging better from when she first call to schedule this visit several days ago -Pulled muscle in back playing with her 140 lb grandson.  She body slammed him onto the couch.  This happened a few days after the stinging started and is also gotten better  -feeling anxious and stress.  Sister age 60 died last 2022/08/30 from COVID19 in a nursing home in [place].  Her brother was having some symptoms to suggest COVID.  She has been maintaining social distancing and staying in side except when she has to go outdoors to get necessary items.  Very anxious about COVID being in the air and feels it is affecting her somehow even though she does not have symptoms.  She has not had any contact with her sister and will not be going to the burial. Hx of anxiety and panic attacks.  Never took anything for it in past and never told her previous doc because it calmed down.  Flaring now  due to stress at home, death of her sister and the COVID pandemia.  Tries to do things to "block it out of my mind" like watching TV.  She worries excessively.  When asked, admits she just started drinking coffee and tea over the past yr.   She is requesting referral to cardiology given history of CHF and paroxysmal atrial fibrillation.  States she was seen in the emergency room several months ago and was told that she may not need to be on blood thinner anymore and was advised to see a cardiologist.  Observations/Objective: No direct observation done  Assessment and Plan: 1. Generalized anxiety disorder -Discussed diagnosis with patient and recommended management.   Discussed with to help keep anxiety and stress level down like daily exercising, meditating with deep breathing exercises and medication.  Patient at first was reluctant to try any medication stating she does not know how it would affect her but then eventually was agreeable to trying a low-dose of Zoloft.  Went over possible side effects.  If it makes her tired, patient told to take it in the evenings. Message has been sent to our licensed clinical social worker to touch base with her in about 2 to 3 weeks to see how she is doing on the medication - sertraline (ZOLOFT) 50 MG tablet; 1/2 tab PO daily x 2 wks then 1 tab PO daily  Dispense: 30 tablet; Refill: 1  2. Acute bilateral back pain, unspecified back location This seems to be getting better  will observe for now  3. Congestive heart failure, unspecified HF chronicity, unspecified heart failure type [facility](HCC) - Ambulatory referral to Cardiology  4. PAF (paroxysmal atrial fibrillation) (HCC) - Ambulatory referral to Cardiology    Follow Up Instructions:  f/u  As previous scheduled   I discussed the assessment and treatment plan with the patient. The patient was provided an opportunity to ask questions and all were answered. The patient agreed with the plan and demonstrated an  understanding of the instructions.   The patient was advised to call back or seek an in-person evaluation if the symptoms worsen or if the condition fails to improve as anticipated.  I provided 28 minutes of non-face-to-face time during this encounter.   Jonah Blueeborah Johnson, MD

## 2018-09-14 ENCOUNTER — Ambulatory Visit (INDEPENDENT_AMBULATORY_CARE_PROVIDER_SITE_OTHER): Payer: Medicaid Other | Admitting: Obstetrics & Gynecology

## 2018-09-14 ENCOUNTER — Encounter: Payer: Self-pay | Admitting: Obstetrics & Gynecology

## 2018-09-14 ENCOUNTER — Other Ambulatory Visit: Payer: Self-pay

## 2018-09-14 ENCOUNTER — Encounter: Payer: Self-pay | Admitting: *Deleted

## 2018-09-14 VITALS — BP 140/93 | HR 76 | Ht 67.0 in | Wt 238.0 lb

## 2018-09-14 DIAGNOSIS — N841 Polyp of cervix uteri: Secondary | ICD-10-CM | POA: Diagnosis not present

## 2018-09-14 DIAGNOSIS — N939 Abnormal uterine and vaginal bleeding, unspecified: Secondary | ICD-10-CM | POA: Diagnosis not present

## 2018-09-14 NOTE — Patient Instructions (Signed)
Cervical Biopsy, Care After  This sheet gives you information about how to care for yourself after your procedure. Your health care provider may also give you more specific instructions. If you have problems or questions, contact your health care provider.  What can I expect after the procedure?  After the procedure, it is common to have:   Cramping or mild pain for a few days.   Slight bleeding from the vagina for a few days.   Dark-colored vaginal discharge for a few days.  Follow these instructions at home:  Lifestyle   Do not douche until your health care provider approves.   Do not use tampons until your health care provider approves.   Do not have sex until your health care provider approves.   Return to your normal activities as told by your health care provider. Ask your health care provider what activities are safe for you.  General instructions     Take over-the-counter and prescription medicines only as told by your health care provider.   Use a sanitary napkin until bleeding and discharge stop.   Keep all follow-up visits as told by your health care provider. This is important.  Contact a health care provider if you have:   A fever or chills.   Bad-smelling vaginal discharge.   Itching or irritation around the vagina.   Lower abdominal pain.  Get help right away if you:   Develop heavy vaginal bleeding that soaks more than one sanitary napkin an hour.   Faint.   Have severe pain in your lower abdomen.  Summary   After the procedure, it is normal to have cramps, slight bleeding, and dark-colored discharge from the vagina.   After you return home, do not douche, have sex, or use a tampon until your health care provider approves.   Take over-the-counter and prescription medicines only as told by your health care provider.   Get help right away if you develop heavy bleeding, you faint, or you have severe pain in your lower abdomen.  This information is not intended to replace advice  given to you by your health care provider. Make sure you discuss any questions you have with your health care provider.  Document Released: 01/08/2015 Document Revised: 09/22/2017 Document Reviewed: 09/22/2017  Elsevier Interactive Patient Education  2019 [employer]vier Inc[employer]

## 2018-09-14 NOTE — Progress Notes (Signed)
GYNECOLOGY OFFICE VISIT NOTE  History:   Jennifer Miller is a 53 y.o. [phone] here today as a referral for AUB/menorrhagia. Was seen by her PCP in 01/2018 and during her annual exam, was noted to have a cervical polyp on examination.  She reported heavy bleeding at the time, but was on blood thinner medication. She has stopped the medication for now due to side effects (has not seen Cardiology and has diagnosis of PAF, CHF).  Since she stopped medication, no heavy bleeding, nut has some spotting a few days before her period that she is not too concerned about.  Reports history of fibroids. She denies any abnormal vaginal discharge, pelvic pain or other concerns.    Past Medical History:  Diagnosis Date  . Diabetes mellitus without complication (HCC)   . Hypertension    Patient Active Problem List   Diagnosis Date Noted  . Generalized anxiety disorder 08/28/2018  . Controlled type 2 diabetes mellitus without complication, without long-term current use of insulin (HCC) 12/30/2017  . Essential hypertension 12/30/2017  . Iron deficiency anemia 12/30/2017  . PAF (paroxysmal atrial fibrillation) (HCC) 12/30/2017  . Acquired hypothyroidism 12/30/2017  . History of colon polyps 12/30/2017  . Hyperlipidemia 12/30/2017  . Congestive heart failure (HCC) 12/30/2017    Past Surgical History:  Procedure Laterality Date  . DILATION AND CURETTAGE OF UTERUS    . KNEE ARTHROSCOPY AND ARTHROTOMY      The following portions of the patient's history were reviewed and updated as appropriate: allergies, current medications, past family history, past medical history, past social history, past surgical history and problem list.   Health Maintenance:  ASCUS pap and negative HRHPV (positive BV) on 02/09/2018.     Review of Systems:  Pertinent items noted in HPI and remainder of comprehensive ROS otherwise negative.  Physical Exam:  BP (!) 140/93   Pulse 76   Ht 5\' 7"  (1.702 m)   Wt 238 lb (108 kg)    BMI 37.28 kg/m  CONSTITUTIONAL: Well-developed, well-nourished female in no acute distress.  HEENT:  Normocephalic, atraumatic. External right and left ear normal. No scleral icterus.  NECK: Normal range of motion, supple, no masses noted on observation SKIN: No rash noted. Not diaphoretic. No erythema. No pallor. MUSCULOSKELETAL: Normal range of motion. No edema noted. NEUROLOGIC: Alert and oriented to person, place, and time. Normal muscle tone coordination. No cranial nerve deficit noted. PSYCHIATRIC: Normal mood and affect. Normal behavior. Normal judgment and thought content. CARDIOVASCULAR: Normal heart rate noted RESPIRATORY: Effort and breath sounds normal, no problems with respiration noted ABDOMEN: No masses noted. No other overt distention noted.   PELVIC: Normal appearing external genitalia; normal appearing vaginal mucosa and cervix.  Small polypoid lesion noted in the ectocervix with narrow stalk. Scant bloody discharge noted.    CERVICAL POLYPECTOMY NOTE Risks of the biopsy including pain, bleeding, infection, inadequate specimen, and need for additional procedures were discussed. The patient stated understanding and agreed to undergo procedure today. Consent was signed, time out performed.  The patient's cervix was prepped with Betadine.  Ring forceps were used to grasp the polypoid lesion and it was removed by twisting it off its base.  Tissue sent to pathology for analysis.  Small bleeding was noted and hemostasis was achieved using silver nitrate sticks.  The patient tolerated the procedure well. Post-procedure instructions were given to the patient. The patient is to call with heavy bleeding, fever greater than 100.4, foul smelling vaginal discharge or other concerns.  Assessment and Plan:    1. Cervical polyp 2. Abnormal uterine bleeding (AUB) - Surgical pathology Polyp removed, will follow up pathology results.  Will follow up bleeding pattern, patient told that further  evaluation (endometrial biopsy and ultrasound) will be indicated for further AUB. She was encouraged to see her cardiologist given that she self-discontinued her blood thinners, needs to make sure she does not need them anymore or can be switched to another one.  Routine preventative health maintenance measures emphasized. Please refer to After Visit Summary for other counseling recommendations.   Return in about 5 weeks (around 10/19/2018) for Virtual GYN Visit.    Total face-to-face time with patient: 20 minutes.  Over 50% of encounter was spent on counseling and coordination of care.   Jennifer CollinsUGONNA  Jennifer Czaplicki, MD, FACOG Obstetrician & Gynecologist, [facility], [facility]

## 2018-09-15 MED FILL — LISINOPRIL 10 MG TABS: 10 | 30 days supply | Qty: 30 | Fill #1

## 2018-09-15 MED FILL — AMLODIPINE BESYLATE 5 MG TA: 5 | 30 days supply | Qty: 30 | Fill #7

## 2018-09-26 MED FILL — LEVOTHYROXINE 100 MCG TAB: 100 | 30 days supply | Qty: 15 | Fill #6

## 2018-09-26 MED FILL — FUROSEMIDE 40 MG TAB: 40 | 30 days supply | Qty: 30 | Fill #1

## 2018-09-29 DIAGNOSIS — M545 Low back pain: Secondary | ICD-10-CM | POA: Diagnosis not present

## 2018-09-29 DIAGNOSIS — R198 Other specified symptoms and signs involving the digestive system and abdomen: Secondary | ICD-10-CM | POA: Diagnosis not present

## 2018-10-16 DIAGNOSIS — S86312A Strain of muscle(s) and tendon(s) of peroneal muscle group at lower leg level, left leg, initial encounter: Secondary | ICD-10-CM | POA: Diagnosis not present

## 2018-10-16 DIAGNOSIS — S238XXA Sprain of other specified parts of thorax, initial encounter: Secondary | ICD-10-CM | POA: Diagnosis not present

## 2018-10-17 ENCOUNTER — Telehealth: Payer: Medicaid Other | Admitting: Internal Medicine

## 2018-10-17 MED FILL — LISINOPRIL 10 MG TABS: 10 | 30 days supply | Qty: 30 | Fill #2

## 2018-10-19 ENCOUNTER — Telehealth: Payer: Self-pay | Admitting: Radiology

## 2018-10-19 ENCOUNTER — Encounter: Payer: Self-pay | Admitting: Obstetrics & Gynecology

## 2018-10-19 ENCOUNTER — Telehealth (INDEPENDENT_AMBULATORY_CARE_PROVIDER_SITE_OTHER): Payer: Medicaid Other | Admitting: Obstetrics & Gynecology

## 2018-10-19 ENCOUNTER — Other Ambulatory Visit: Payer: Self-pay

## 2018-10-19 DIAGNOSIS — Z1231 Encounter for screening mammogram for malignant neoplasm of breast: Secondary | ICD-10-CM

## 2018-10-19 DIAGNOSIS — N939 Abnormal uterine and vaginal bleeding, unspecified: Secondary | ICD-10-CM

## 2018-10-19 NOTE — Patient Instructions (Signed)
Return to clinic for any scheduled appointments or for any gynecologic concerns as needed.   

## 2018-10-19 NOTE — Progress Notes (Signed)
I connected with  [name] on 10/19/18 at  9:45 AM EDT by telephone and verified that I am speaking with the correct person using two identifiers.   I discussed the limitations, risks, security and privacy concerns of performing an evaluation and management service by telephone and the availability of in person appointments. I also discussed with the patient that there may be a patient responsible charge related to this service. The patient expressed understanding and agreed to proceed.  Crosby Oyster, RN 10/19/2018  10:00 AM

## 2018-10-19 NOTE — Telephone Encounter (Signed)
Left message for patient to call [facility] to schedule Mammogram, per request of [facility]. They had questions that they wanted to ask patient prior to scheduling appointment.

## 2018-10-19 NOTE — Progress Notes (Signed)
    TELEHEALTH GYNECOLOGY VIRTUAL VIDEO VISIT ENCOUNTER NOTE  Provider location: Center for [employer] at [facility]   I connected with Jennifer Miller on 10/19/18 at  9:45 AM EDT by MyChart Video Encounter at home and verified that I am speaking with the correct person using two identifiers.   I discussed the limitations, risks, security and privacy concerns of performing an evaluation and management service by telephone and the availability of in person appointments. I also discussed with the patient that there may be a patient responsible charge related to this service. The patient expressed understanding and agreed to proceed.   History:  Jennifer Miller is a 53 y.o. [phone] female being followed up today after cervical polypectomy for AUB on 09/14/2018. Denies any further bleeding since episode.  She denies any abnormal vaginal discharge, pelvic pain or other concerns.       Past Medical History:  Diagnosis Date  . Diabetes mellitus without complication ([place])   . Hypertension    Past Surgical History:  Procedure Laterality Date  . DILATION AND CURETTAGE OF UTERUS    . KNEE ARTHROSCOPY AND ARTHROTOMY     The following portions of the patient's history were reviewed and updated as appropriate: allergies, current medications, past family history, past medical history, past social history, past surgical history and problem list.   Health Maintenance:  ASCUS pap and negative HRHPV (positive BV) on 02/09/2018. Normal mammogram many years ago.   Review of Systems:  Pertinent items noted in HPI and remainder of comprehensive ROS otherwise negative.  Physical Exam:   General:  Alert, oriented and cooperative. Patient appears to be in no acute distress.  Mental Status: Normal mood and affect. Normal behavior. Normal judgment and thought content.   Respiratory: Normal respiratory effort, no problems with respiration noted  Rest of physical exam deferred due to type of encounter   Labs and Imaging 09/14/18 Pathology Diagnosis  - ENDOCERVICAL POLYP  - GRANULATION TISSUE WITH MARKED CHRONIC INFLAMMATION     Assessment and Plan:     1. Encounter for screening mammogram for breast cancer Screening mammogram ordered for patient - MM 3D SCREEN BREAST BILATERAL; Future  2. Abnormal uterine bleeding (AUB) Benign pathology discussed with patient, reassured by no further bleeding. If bleeding recurs, will need further evaluation and management.    I discussed the assessment and treatment plan with the patient. The patient was provided an opportunity to ask questions and all were answered. The patient agreed with the plan and demonstrated an understanding of the instructions.   The patient was advised to call back or seek an in-person evaluation/go to the ED if the symptoms worsen or if the condition fails to improve as anticipated.  I provided 10 minutes of face-to-face time during this encounter.   Verita Schneiders, MD Center for [employer], [place]

## 2018-10-24 MED FILL — FUROSEMIDE 40 MG TAB: 40 | 30 days supply | Qty: 30 | Fill #2

## 2018-10-24 MED FILL — AMLODIPINE BESYLATE 5 MG TA: 5 | 30 days supply | Qty: 30 | Fill #8

## 2018-11-01 ENCOUNTER — Telehealth: Payer: Self-pay | Admitting: Licensed Clinical Social Worker

## 2018-11-01 NOTE — Telephone Encounter (Signed)
LCSW placed call to patient. Pt was informed of consult from PCP to inquire about behavioral health.   Pt shared that she was having difficulty managing anxiety and depression symptoms. She is grieving the loss of three immediate family members within the last three months. She has taken her zoloft medication once, stating that she did not like how it made her feel. Pt reports that her symptoms have decreased and she is practicing more mindfulness.   LCSW provided validation and encouragement. Therapeutic strategies were discussed to assist with the management of mental health, in addition, to medication management.   LCSW provided grief support resources and strongly encouraged pt to contact the clinic with any questions or concerns. Pt verbalized understanding and appreciation for the follow up call.

## 2018-11-06 ENCOUNTER — Other Ambulatory Visit: Payer: Self-pay | Admitting: Internal Medicine

## 2018-11-06 DIAGNOSIS — E039 Hypothyroidism, unspecified: Secondary | ICD-10-CM

## 2018-11-07 MED FILL — LEVOTHYROXINE 100 MCG TAB: 100 | 30 days supply | Qty: 15 | Fill #0

## 2018-11-22 MED FILL — AMLODIPINE BESYLATE 5 MG TA: 5 | 30 days supply | Qty: 30 | Fill #9

## 2018-11-22 MED FILL — LISINOPRIL 10 MG TABS: 10 | 30 days supply | Qty: 30 | Fill #3

## 2018-11-24 MED FILL — LEVOTHYROXINE 100 MCG TAB: 100 | 30 days supply | Qty: 15 | Fill #0

## 2018-11-28 MED FILL — FUROSEMIDE 40 MG TAB: 40 | 30 days supply | Qty: 30 | Fill #3

## 2018-12-06 MED FILL — METOPROLOL TARTRATE 50 MG T: 50 | 90 days supply | Qty: 180 | Fill #1

## 2018-12-15 DIAGNOSIS — H524 Presbyopia: Secondary | ICD-10-CM | POA: Diagnosis not present

## 2018-12-15 DIAGNOSIS — H5213 Myopia, bilateral: Secondary | ICD-10-CM | POA: Diagnosis not present

## 2018-12-15 DIAGNOSIS — E119 Type 2 diabetes mellitus without complications: Secondary | ICD-10-CM | POA: Diagnosis not present

## 2018-12-22 MED FILL — AMLODIPINE BESYLATE 5 MG TA: 5 | 30 days supply | Qty: 30 | Fill #10

## 2018-12-22 MED FILL — LISINOPRIL 10 MG TABS: 10 | 30 days supply | Qty: 30 | Fill #4

## 2018-12-27 MED FILL — FUROSEMIDE 40 MG TAB: 40 | 30 days supply | Qty: 30 | Fill #4

## 2018-12-28 ENCOUNTER — Other Ambulatory Visit: Payer: Self-pay

## 2018-12-28 ENCOUNTER — Ambulatory Visit: Payer: Medicaid Other | Attending: Family Medicine | Admitting: Physician Assistant

## 2018-12-28 DIAGNOSIS — E119 Type 2 diabetes mellitus without complications: Secondary | ICD-10-CM

## 2018-12-28 DIAGNOSIS — M25561 Pain in right knee: Secondary | ICD-10-CM

## 2018-12-28 DIAGNOSIS — I10 Essential (primary) hypertension: Secondary | ICD-10-CM | POA: Diagnosis not present

## 2018-12-28 DIAGNOSIS — I509 Heart failure, unspecified: Secondary | ICD-10-CM

## 2018-12-28 MED ORDER — TRAMADOL HCL 50 MG PO TABS: 50.0000 mg | ORAL_TABLET | Freq: Three times a day (TID) | ORAL | 0 refills | Status: AC | PRN

## 2018-12-28 MED ORDER — MELOXICAM 15 MG PO TABS: 15.0000 mg | ORAL_TABLET | Freq: Every day | ORAL | 1 refills | Status: DC

## 2018-12-28 MED FILL — traMADol HCL 50 MG TABS: 50 | 7 days supply | Qty: 21 | Fill #0

## 2018-12-28 NOTE — Progress Notes (Signed)
Virtual Visit via Telephone Note  I connected with Jennifer Miller on 12/28/18 at  4:10 PM EDT by telephone and verified that I am speaking with the correct person using two identifiers.   I discussed the limitations, risks, security and privacy concerns of performing an evaluation and management service by telephone and the availability of in person appointments. I also discussed with the patient that there may be a patient responsible charge related to this service. The patient expressed understanding and agreed to proceed.  Patient location:  work My Location:  [place] office Persons on the call: me and the patient   History of Present Illness:  R knee pain on and off for years.  Describes pain as sharp.  Pain is getting worse.  Tylenol is not helping.  No swelling or redness of the joint.  No fever. L meniscus tear and repair ~2014.  Also, R knee clicks and pops at times.  Also, requesting cardiology referral bc of h/o CHF and she hasn't seen a cardiologist since moving to [place].  No CP currently.    Blood sugars running 120-130    Observations/Objective:  A&Ox3   Assessment and Plan: 1. Recurrent pain of right knee - Ambulatory referral to Orthopedic Surgery - traMADol (ULTRAM) 50 MG tablet; Take 1 tablet (50 mg total) by mouth every 8 (eight) hours as needed for up to 5 days.  Dispense: 30 tablet; Refill: 0  2. Controlled type 2 diabetes mellitus without complication, without long-term current use of insulin (HCC) Continue current regimen  3. Chronic congestive heart failure, unspecified heart failure type ([place]) Continue current meds - Ambulatory referral to Cardiology  4. Essential hypertension Continue current meds.   - Ambulatory referral to Cardiology    Follow Up Instructions:    I discussed the assessment and treatment plan with the patient. The patient was provided an opportunity to ask questions and all were answered. The patient agreed with the plan and demonstrated  an understanding of the instructions.   The patient was advised to call back or seek an in-person evaluation if the symptoms worsen or if the condition fails to improve as anticipated.  I provided 16 minutes of non-face-to-face time during this encounter.   Freeman Caldron, PA-C  Patient ID: Jennifer Miller, female   DOB: 1966-03-28, 53 y.o.   MRN: [id number]

## 2019-01-03 ENCOUNTER — Encounter: Payer: Self-pay | Admitting: Orthopaedic Surgery

## 2019-01-03 ENCOUNTER — Ambulatory Visit: Payer: Self-pay

## 2019-01-03 ENCOUNTER — Ambulatory Visit (INDEPENDENT_AMBULATORY_CARE_PROVIDER_SITE_OTHER): Payer: Medicaid Other

## 2019-01-03 ENCOUNTER — Ambulatory Visit
Admission: RE | Admit: 2019-01-03 | Discharge: 2019-01-03 | Disposition: A | Discharge status: Home / Self Care | Payer: Medicaid Other | Source: Ambulatory Visit | Attending: Obstetrics & Gynecology | Admitting: Obstetrics & Gynecology

## 2019-01-03 ENCOUNTER — Ambulatory Visit (INDEPENDENT_AMBULATORY_CARE_PROVIDER_SITE_OTHER): Payer: Medicaid Other | Admitting: Orthopaedic Surgery

## 2019-01-03 DIAGNOSIS — Z1231 Encounter for screening mammogram for malignant neoplasm of breast: Secondary | ICD-10-CM | POA: Diagnosis not present

## 2019-01-03 DIAGNOSIS — M25561 Pain in right knee: Secondary | ICD-10-CM

## 2019-01-03 DIAGNOSIS — M25562 Pain in left knee: Secondary | ICD-10-CM

## 2019-01-03 DIAGNOSIS — G8929 Other chronic pain: Secondary | ICD-10-CM | POA: Diagnosis not present

## 2019-01-03 NOTE — Progress Notes (Signed)
Office Visit Note   Patient: Jennifer PrudeJohnna Miller           Date of Birth: 07/03/1965           MRN: [id number] Visit Date: 01/03/2019              Requested by: Anders SimmondsMcClung, Angela M, PA-C [address] [place],  [place] [id number] PCP: Marcine MatarJohnson, Deborah B, MD   Assessment & Plan: Visit Diagnoses:  1. Chronic pain of left knee   2. Chronic pain of right knee     Plan: I did explain to her that she does have osteoarthritis in both her knees and will help her the most is Tylenol arthritis combined with weight loss and quad strengthening exercises.  I did offer steroid injections in both knees today but she defers this.  She is going to try Voltaren gel which is an over-the-counter anti-inflammatory which is topical and I told her this would be more safe given her blood thinning medications.  There is a low systemic absorption from these.  She will try this first.  If this does not help she will give us a call back to come in for steroid injections to try both her knees.  [facility] which unfortunately does not allow for hyaluronic acid injections.  Follow-Up Instructions: Return if symptoms worsen or fail to improve.   Orders:  Orders Placed This Encounter  Procedures  . XR Knee 1-2 Views Left  . XR Knee 1-2 Views Right   No orders of the defined types were placed in this encounter.     Procedures: No procedures performed   Clinical Data: No additional findings.   Subjective: Chief Complaint  Patient presents with  . Right Knee - Pain  Patient is a very pleasant 53 year old female who comes in for evaluation of bilateral knee pain.  Both knees have been hurting for a while.  She does have a history of a meniscal tear of her left knee and had arthroscopic surgery.  She hurts from her hips down to her feet in which she wonders if this is from overcompensating.  Her knees do hurt on a daily basis.  She cannot take anti-inflammatories due to the fact she is on blood  thinning medication.  She is diabetic but reports excellent control.  Her knees seem to hurt with chronic aching on a daily basis and are mainly activity related.  She denies any locking catching but they are painful to her.  HPI  Review of Systems She currently denies any headache, chest pain, shortness of breath, fever, chills, nausea, vomiting  Objective: Vital Signs: There were no vitals taken for this visit.  Physical Exam She is alert and orient x3 and in no acute distress Ortho Exam Examination of both knees shows just slight varus malalignment.  There is slight patella firm crepitation.  There is no effusion with either knee.  Both knees have full range of motion and are ligamentously stable.  Both knees have medial joint line tenderness. Specialty Comments:  No specialty comments available.  Imaging: Xr Knee 1-2 Views Left  Result Date: 01/03/2019 An AP and lateral of the left knee show moderate arthritis of the patellofemoral joint and the medial compartment of the knee.  There is no effusion and otherwise no acute findings.  Xr Knee 1-2 Views Right  Result Date: 01/03/2019 An AP and lateral of the right knee shows moderate arthritic changes involving the medial compartment of  the knee and the patellofemoral joint.  There is otherwise no acute findings.    PMFS History: Patient Active Problem List   Diagnosis Date Noted  . Generalized anxiety disorder 08/28/2018  . Controlled type 2 diabetes mellitus without complication, without long-term current use of insulin ([place]) 12/30/2017  . Essential hypertension 12/30/2017  . Iron deficiency anemia 12/30/2017  . PAF (paroxysmal atrial fibrillation) ([place]) 12/30/2017  . Acquired hypothyroidism 12/30/2017  . History of colon polyps 12/30/2017  . Hyperlipidemia 12/30/2017  . Congestive heart failure ([place]) 12/30/2017   Past Medical History:  Diagnosis Date  . Diabetes mellitus without complication ([place])   . Hypertension      Family History  Problem Relation Age of Onset  . Diabetes Mother   . Hypertension Mother   . Dementia Mother   . Diabetes Father   . Hypertension Father   . Diabetes Maternal Grandmother   . Diabetes Paternal Grandmother   . Heart disease Paternal Grandmother     Past Surgical History:  Procedure Laterality Date  . DILATION AND CURETTAGE OF UTERUS    . KNEE ARTHROSCOPY AND ARTHROTOMY     Social History   Occupational History  . Occupation: unemployed  Tobacco Use  . Smoking status: Former Research scientist (life sciences)  . Smokeless tobacco: Never Used  Substance and Sexual Activity  . Alcohol use: Yes    Comment: rarely  . Drug use: Never  . Sexual activity: Yes    Birth control/protection: None

## 2019-01-22 ENCOUNTER — Other Ambulatory Visit: Payer: Self-pay

## 2019-01-22 ENCOUNTER — Encounter: Payer: Self-pay | Admitting: Cardiology

## 2019-01-22 ENCOUNTER — Ambulatory Visit (INDEPENDENT_AMBULATORY_CARE_PROVIDER_SITE_OTHER): Payer: Medicaid Other | Admitting: Cardiology

## 2019-01-22 VITALS — BP 160/100 | HR 97 | Temp 97.6°F | Ht 67.0 in | Wt 253.0 lb

## 2019-01-22 DIAGNOSIS — I480 Paroxysmal atrial fibrillation: Secondary | ICD-10-CM

## 2019-01-22 DIAGNOSIS — I10 Essential (primary) hypertension: Secondary | ICD-10-CM | POA: Diagnosis not present

## 2019-01-22 DIAGNOSIS — I509 Heart failure, unspecified: Secondary | ICD-10-CM | POA: Diagnosis not present

## 2019-01-22 NOTE — Patient Instructions (Signed)
Medication Instructions:  Your physician recommends that you continue on your current medications as directed. Please refer to the Current Medication list given to you today.  If you need a refill on your cardiac medications before your next appointment, please call your pharmacy.   Lab work: None ordered If you have labs (blood work) drawn today and your tests are completely normal, you will receive your results only by: Marland Kitchen MyChart Message (if you have MyChart) OR . A paper copy in the mail If you have any lab test that is abnormal or we need to change your treatment, we will call you to review the results.  Testing/Procedures: Your physician has requested that you have an echocardiogram. Echocardiography is a painless test that uses sound waves to create images of your heart. It provides your doctor with information about the size and shape of your heart and how well your heart's chambers and valves are working. This procedure takes approximately one hour. There are no restrictions for this procedure.    Follow-Up: At [facility], you and your health needs are our priority.  As part of our continuing mission to provide you with exceptional heart care, we have created designated Provider Care Teams.  These Care Teams include your primary Cardiologist (physician) and Advanced Practice Providers (APPs -  Physician Assistants and Nurse Practitioners) who all work together to provide you with the care you need, when you need it. You will need a follow up appointment in 4-6 weeks.   You may see Kate Sable, MD or one of the following Advanced Practice Providers on your designated Care Team:   Murray Hodgkins, NP Christell Faith, PA-C . Marrianne Mood, PA-C  Any Other Special Instructions Will Be Listed Below (If Applicable). N/A

## 2019-01-22 NOTE — Progress Notes (Addendum)
Cardiology Office Note:    Date:  01/22/2019   ID:  Jennifer Miller, DOB Aug 19, 1965, MRN [id number]  PCP:  Ladell Pier, MD  Cardiologist:  Kate Sable, MD  Electrophysiologist:  None   Referring MD: Argentina Donovan, PA-C   Chief Complaint  Patient presents with  . New Patient (Initial Visit)    CHF    History of Present Illness:    Jennifer Miller is a 53 y.o. female with a hx of hypertension, diabetes, heart failure who presents to establish care.  Patient was diagnosed with congestive heart failure about 19 years ago after having a baby.  She was fine before.  She recently moved to the area from [place].  She was placed on medications including Metroprolol tartrate, lisinopril, Lasix 40 mg every day.  She denies edema, orthopnea.  About 6 years ago, she states eating 3 hot dogs and drinking some soda while at her mother's home.  She later on noticed her heart was beating fast and irregular.  This prompted her to go to the ED.  She was evaluated and told she was in atrial fibrillation.  She was started on anticoagulation which she has taken since, currently takes Pradaxa twice daily.  She is a former smoker, denies any history of MI.  She states not taking her blood pressure medications this morning because she has not eaten yet.  Past Medical History:  Diagnosis Date  . Congestive heart failure (CHF) ([place])   . Coronary artery disease   . Diabetes mellitus without complication ([place])   . HLD (hyperlipidemia)   . Hypertension   . Paroxysmal A-fib [facility])     Past Surgical History:  Procedure Laterality Date  . DILATION AND CURETTAGE OF UTERUS    . KNEE ARTHROSCOPY AND ARTHROTOMY      Current Medications: Current Meds  Medication Sig  . albuterol (PROVENTIL HFA;VENTOLIN HFA) 108 (90 Base) MCG/ACT inhaler Inhale 1-2 puffs into the lungs every 6 (six) hours as needed for wheezing or shortness of breath.  Marland Kitchen amLODipine (NORVASC) 5 MG tablet Take 1 tablet (5 mg total) by  mouth daily.  Marland Kitchen atorvastatin (LIPITOR) 20 MG tablet Take 1 tablet (20 mg total) by mouth daily.  . Blood Glucose Monitoring Suppl (TRUE METRIX METER) w/Device KIT Use as directed  . dabigatran (PRADAXA) 150 MG CAPS capsule Take 1 capsule (150 mg total) by mouth 2 (two) times daily.  . fluticasone (FLONASE) 50 MCG/ACT nasal spray Place 1 spray into both nostrils daily.  . furosemide (LASIX) 40 MG tablet Take 1 tablet (40 mg total) by mouth daily.  Marland Kitchen glucose blood (TRUE METRIX BLOOD GLUCOSE TEST) test strip Check blood sugar fasting and before meals and again if pt feels bad (symptoms of hypo).  Marland Kitchen levothyroxine (SYNTHROID) 100 MCG tablet TAKE 1/2 TABLET BY MOUTH DAILY BEFORE BREAKFAST  . lisinopril (PRINIVIL,ZESTRIL) 10 MG tablet Take 1 tablet (10 mg total) by mouth daily.  . metoprolol tartrate (LOPRESSOR) 50 MG tablet Take 1 tablet (50 mg total) by mouth 2 (two) times daily.  Marland Kitchen PAZEO 0.7 % SOLN as needed.   . TRUEPLUS LANCETS 28G MISC Check blood sugar fasting and before meals and again if pt feels bad (symptoms of hypo).     Allergies:   Patient has no known allergies.   Social History   Socioeconomic History  . Marital status: Single    Spouse name: Not on file  . Number of children: 3  . Years of education:  Not on file  . Highest education level: Not on file  Occupational History  . Occupation: unemployed  Social Needs  . Financial resource strain: Not on file  . Food insecurity    Worry: Not on file    Inability: Not on file  . Transportation needs    Medical: Not on file    Non-medical: Not on file  Tobacco Use  . Smoking status: Former Research scientist (life sciences)  . Smokeless tobacco: Never Used  Substance and Sexual Activity  . Alcohol use: Yes    Comment: rarely  . Drug use: Never  . Sexual activity: Yes    Birth control/protection: None  Lifestyle  . Physical activity    Days per week: Not on file    Minutes per session: Not on file  . Stress: Not on file  Relationships  . Social  Herbalist on phone: Not on file    Gets together: Not on file    Attends religious service: Not on file    Active member of club or organization: Not on file    Attends meetings of clubs or organizations: Not on file    Relationship status: Not on file  Other Topics Concern  . Not on file  Social History Narrative  . Not on file     Family History: The patient's family history includes Dementia in her mother; Diabetes in her father, maternal grandmother, mother, and paternal grandmother; Heart disease in her paternal grandmother; Hypertension in her father and mother.  ROS:   Please see the history of present illness.     All other systems reviewed and are negative.  EKGs/Labs/Other Studies Reviewed:    The following studies were reviewed today:   EKG:  EKG is  ordered today.  The ekg ordered today demonstrates normal sinus rhythm, normal ECG.  Recent Labs: 05/01/2018: ALT 20 05/12/2018: TSH 1.920 07/03/2018: BUN 13; Creatinine, Ser 0.64; Hemoglobin 13.2; Platelets 329; Potassium 3.9; Sodium 139  Recent Lipid Panel    Component Value Date/Time   CHOL 148 12/29/2017 1035   TRIG 97 12/29/2017 1035   HDL 41 12/29/2017 1035   CHOLHDL 3.6 12/29/2017 1035   LDLCALC 88 12/29/2017 1035    Physical Exam:    VS:  BP (!) 160/100 (BP Location: Right Arm, Patient Position: Sitting, Cuff Size: Normal)   Pulse 97   Temp 97.6 F (36.4 C)   Ht _0  (1.702 m)   Wt 253 lb (114.8 kg)   SpO2 99%   BMI 39.63 kg/m     Wt Readings from Last 3 Encounters:  01/22/19 253 lb (114.8 kg)  09/14/18 238 lb (108 kg)  07/03/18 234 lb (106.1 kg)     GEN:  Well nourished, well developed in no acute distress HEENT: Normal NECK: No JVD; No carotid bruits LYMPHATICS: No lymphadenopathy CARDIAC: RRR, no murmurs, rubs, gallops RESPIRATORY:  Clear to auscultation without rales, wheezing or rhonchi  ABDOMEN: Soft, non-tender, non-distended, obese MUSCULOSKELETAL:  No edema; No  deformity  SKIN: Warm and dry NEUROLOGIC:  Alert and oriented x 3 PSYCHIATRIC:  Normal affect   ASSESSMENT:   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  Patient currently with no edema and seems euvolemic.  History of paroxysmal A. fib, currently in sinus rhythm, ChadsVasc score= 3.  1. Chronic congestive heart failure, unspecified heart failure type ([place])   2. PAF (paroxysmal atrial fibrillation) ([place])   3. Essential hypertension    PLAN:  In order of problems listed above:  1. Get echocardiogram.  Continue Lasix 40 mg daily, continue lisinopril 10 mg daily. 2. Continue Lopressor 50 mg twice daily, continue Pradaxa twice daily. 3. Patient advised to take her blood pressure medications as currently prescribed.  She was counseled on home blood pressure measurements and low-salt diet.   Medication Adjustments/Labs and Tests Ordered: Current medicines are reviewed at length with the patient today.  Concerns regarding medicines are outlined above.  Orders Placed This Encounter  Procedures  . EKG 12-Lead  . ECHOCARDIOGRAM COMPLETE   No orders of the defined types were placed in this encounter.   Patient Instructions  Medication Instructions:  Your physician recommends that you continue on your current medications as directed. Please refer to the Current Medication list given to you today.  If you need a refill on your cardiac medications before your next appointment, please call your pharmacy.   Lab work: None ordered If you have labs (blood work) drawn today and your tests are completely normal, you will receive your results only by: Marland Kitchen MyChart Message (if you have MyChart) OR . A paper copy in the mail If you have any lab test that is abnormal or we need to change your treatment, we will call you to review the results.  Testing/Procedures: Your physician has requested that you have an echocardiogram. Echocardiography is a painless test that uses sound  waves to create images of your heart. It provides your doctor with information about the size and shape of your heart and how well your heart's chambers and valves are working. This procedure takes approximately one hour. There are no restrictions for this procedure.    Follow-Up: At [facility], you and your health needs are our priority.  As part of our continuing mission to provide you with exceptional heart care, we have created designated Provider Care Teams.  These Care Teams include your primary Cardiologist (physician) and Advanced Practice Providers (APPs -  Physician Assistants and Nurse Practitioners) who all work together to provide you with the care you need, when you need it. You will need a follow up appointment in 4-6 weeks.   You may see Kate Sable, MD or one of the following Advanced Practice Providers on your designated Care Team:   Murray Hodgkins, NP Christell Faith, PA-C . Marrianne Mood, PA-C  Any Other Special Instructions Will Be Listed Below (If Applicable). N/A      Signed, Kate Sable, MD  01/22/2019 2:19 PM    [facility]

## 2019-01-23 MED FILL — AMLODIPINE BESYLATE 5 MG TA: 5 | 30 days supply | Qty: 30 | Fill #11

## 2019-01-23 MED FILL — LISINOPRIL 10 MG TABS: 10 | 30 days supply | Qty: 30 | Fill #5

## 2019-01-25 MED FILL — LEVOTHYROXINE 100 MCG TAB: 100 | 30 days supply | Qty: 15 | Fill #1

## 2019-01-25 MED FILL — FUROSEMIDE 40 MG TAB: 40 | 30 days supply | Qty: 30 | Fill #5

## 2019-01-29 MED FILL — ALBUTEROL SULFATE HFA 108 (: 108 (90 BAS | 25 days supply | Qty: 18 | Fill #1

## 2019-01-31 NOTE — Progress Notes (Signed)
Virtual Visit via Telephone Note  I connected with [name] on 01/31/19 at  2:10 PM EDT by telephone and verified that I am speaking with the correct person using two identifiers.   I discussed the limitations, risks, security and privacy concerns of performing an evaluation and management service by telephone and the availability of in person appointments. I also discussed with the patient that there may be a patient responsible charge related to this service. The patient expressed understanding and agreed to proceed.  Patient location: home My Location:  home office Persons on the call:  Me and the patient   History of Present Illness:  Pain in L side of stomach/ribs/abdomen for about 3 days after having URI and sinus symptoms with cough.  No f/c.  Cough has resolved.  Pain is worse with movement/activity/work.  No N/V/D.  No diaphoresis.  No SOB.  No weakness/dizziness   Observations/Objective: A&Ox3  Assessment and Plan: 1. Left sided abdominal pain/rib pain s/p cough - Comprehensive metabolic panel; Future - Lipase; Future - methocarbamol (ROBAXIN) 500 MG tablet; Take 2 tablets (1,000 mg total) by mouth every 8 (eight) hours as needed for muscle spasms.  Dispense: 90 tablet; Refill: 0 - DG Ribs Unilateral W/Chest Left; Future  2. Cough - DG Ribs Unilateral W/Chest Left; Future   Follow Up Instructions: Keep appt with Dr Wynetta Emery   I discussed the assessment and treatment plan with the patient. The patient was provided an opportunity to ask questions and all were answered. The patient agreed with the plan and demonstrated an understanding of the instructions.   The patient was advised to call back or seek an in-person evaluation if the symptoms worsen or if the condition fails to improve as anticipated.  I provided 16 minutes of non-face-to-face time during this encounter.   Freeman Caldron, PA-C  Patient ID: Jennifer Miller, female   DOB: July 30, 1965, 53 y.o.   MRN:  [id number]

## 2019-02-01 ENCOUNTER — Ambulatory Visit
Admission: RE | Admit: 2019-02-01 | Discharge: 2019-02-01 | Disposition: A | Discharge status: Home / Self Care | Payer: Medicaid Other | Source: Ambulatory Visit | Attending: Physician Assistant | Admitting: Physician Assistant

## 2019-02-01 ENCOUNTER — Ambulatory Visit: Payer: Medicaid Other | Attending: Internal Medicine | Admitting: Physician Assistant

## 2019-02-01 ENCOUNTER — Other Ambulatory Visit: Payer: Self-pay

## 2019-02-01 DIAGNOSIS — R05 Cough: Secondary | ICD-10-CM | POA: Diagnosis not present

## 2019-02-01 DIAGNOSIS — R109 Unspecified abdominal pain: Secondary | ICD-10-CM | POA: Diagnosis not present

## 2019-02-01 DIAGNOSIS — I509 Heart failure, unspecified: Secondary | ICD-10-CM | POA: Diagnosis not present

## 2019-02-01 DIAGNOSIS — R059 Cough, unspecified: Secondary | ICD-10-CM

## 2019-02-01 DIAGNOSIS — R0781 Pleurodynia: Secondary | ICD-10-CM | POA: Diagnosis not present

## 2019-02-01 MED ORDER — METHOCARBAMOL 500 MG PO TABS: 1000.0000 mg | ORAL_TABLET | Freq: Three times a day (TID) | ORAL | 0 refills | Status: DC | PRN

## 2019-02-16 ENCOUNTER — Ambulatory Visit: Payer: Medicaid Other | Admitting: Internal Medicine

## 2019-02-22 ENCOUNTER — Other Ambulatory Visit: Payer: Self-pay | Admitting: Internal Medicine

## 2019-02-22 DIAGNOSIS — I10 Essential (primary) hypertension: Secondary | ICD-10-CM

## 2019-02-22 DIAGNOSIS — I509 Heart failure, unspecified: Secondary | ICD-10-CM

## 2019-02-22 MED FILL — FUROSEMIDE 40 MG TAB: 40 | 30 days supply | Qty: 30 | Fill #0

## 2019-02-23 MED FILL — AMLODIPINE BESYLATE 5 MG TA: 5 | 30 days supply | Qty: 30 | Fill #0

## 2019-02-23 MED FILL — METOPROLOL TARTRATE 50 MG T: 50 | 90 days supply | Qty: 180 | Fill #0

## 2019-02-23 MED FILL — LISINOPRIL 10 MG TABS: 10 | 30 days supply | Qty: 30 | Fill #0

## 2019-02-27 ENCOUNTER — Other Ambulatory Visit: Payer: Medicaid Other

## 2019-03-09 ENCOUNTER — Encounter: Payer: Medicaid Other | Admitting: Cardiology

## 2019-03-09 ENCOUNTER — Other Ambulatory Visit: Payer: Self-pay

## 2019-03-09 DIAGNOSIS — H524 Presbyopia: Secondary | ICD-10-CM | POA: Diagnosis not present

## 2019-03-13 ENCOUNTER — Ambulatory Visit (INDEPENDENT_AMBULATORY_CARE_PROVIDER_SITE_OTHER): Payer: Medicaid Other

## 2019-03-13 ENCOUNTER — Other Ambulatory Visit: Payer: Self-pay

## 2019-03-13 DIAGNOSIS — I509 Heart failure, unspecified: Secondary | ICD-10-CM | POA: Diagnosis not present

## 2019-03-13 DIAGNOSIS — I480 Paroxysmal atrial fibrillation: Secondary | ICD-10-CM

## 2019-03-13 NOTE — Progress Notes (Signed)
This encounter was created in error - please disregard.

## 2019-03-23 ENCOUNTER — Encounter: Payer: Self-pay | Admitting: Internal Medicine

## 2019-03-23 ENCOUNTER — Other Ambulatory Visit: Payer: Self-pay

## 2019-03-23 ENCOUNTER — Ambulatory Visit: Payer: Medicaid Other | Attending: Internal Medicine | Admitting: Internal Medicine

## 2019-03-23 VITALS — BP 138/88 | HR 80 | Temp 98.1°F | Resp 16 | Wt 254.8 lb

## 2019-03-23 DIAGNOSIS — Z7901 Long term (current) use of anticoagulants: Secondary | ICD-10-CM | POA: Diagnosis not present

## 2019-03-23 DIAGNOSIS — I480 Paroxysmal atrial fibrillation: Secondary | ICD-10-CM | POA: Diagnosis not present

## 2019-03-23 DIAGNOSIS — Z87891 Personal history of nicotine dependence: Secondary | ICD-10-CM | POA: Insufficient documentation

## 2019-03-23 DIAGNOSIS — I509 Heart failure, unspecified: Secondary | ICD-10-CM | POA: Insufficient documentation

## 2019-03-23 DIAGNOSIS — I110 Hypertensive heart disease with heart failure: Secondary | ICD-10-CM | POA: Insufficient documentation

## 2019-03-23 DIAGNOSIS — E039 Hypothyroidism, unspecified: Secondary | ICD-10-CM | POA: Diagnosis not present

## 2019-03-23 DIAGNOSIS — Z1211 Encounter for screening for malignant neoplasm of colon: Secondary | ICD-10-CM | POA: Diagnosis not present

## 2019-03-23 DIAGNOSIS — Z79899 Other long term (current) drug therapy: Secondary | ICD-10-CM | POA: Insufficient documentation

## 2019-03-23 DIAGNOSIS — I10 Essential (primary) hypertension: Secondary | ICD-10-CM

## 2019-03-23 DIAGNOSIS — R0981 Nasal congestion: Secondary | ICD-10-CM | POA: Diagnosis not present

## 2019-03-23 DIAGNOSIS — M170 Bilateral primary osteoarthritis of knee: Secondary | ICD-10-CM | POA: Diagnosis not present

## 2019-03-23 DIAGNOSIS — E119 Type 2 diabetes mellitus without complications: Secondary | ICD-10-CM | POA: Insufficient documentation

## 2019-03-23 DIAGNOSIS — F411 Generalized anxiety disorder: Secondary | ICD-10-CM | POA: Diagnosis not present

## 2019-03-23 DIAGNOSIS — E785 Hyperlipidemia, unspecified: Secondary | ICD-10-CM | POA: Diagnosis not present

## 2019-03-23 DIAGNOSIS — Z2821 Immunization not carried out because of patient refusal: Secondary | ICD-10-CM

## 2019-03-23 DIAGNOSIS — D509 Iron deficiency anemia, unspecified: Secondary | ICD-10-CM | POA: Insufficient documentation

## 2019-03-23 LAB — GLUCOSE, POCT (MANUAL RESULT ENTRY): POC Glucose: 131 mg/dl — AB (ref 70–99)

## 2019-03-23 MED ORDER — LEVOTHYROXINE SODIUM 100 MCG PO TABS: ORAL_TABLET | ORAL | 4 refills | Status: DC

## 2019-03-23 MED ORDER — FLUTICASONE PROPIONATE 50 MCG/ACT NA SUSP: 1.0000 | Freq: Every day | NASAL | 6 refills | Status: DC

## 2019-03-23 MED ORDER — METOPROLOL TARTRATE 50 MG PO TABS: 50.0000 mg | ORAL_TABLET | Freq: Two times a day (BID) | ORAL | 4 refills | Status: DC

## 2019-03-23 MED ORDER — AMLODIPINE BESYLATE 10 MG PO TABS: 10.0000 mg | ORAL_TABLET | Freq: Every day | ORAL | 6 refills | Status: DC

## 2019-03-23 MED ORDER — LISINOPRIL 10 MG PO TABS: 10.0000 mg | ORAL_TABLET | Freq: Every day | ORAL | 6 refills | Status: DC

## 2019-03-23 MED ORDER — LORATADINE 10 MG PO TABS: 10.0000 mg | ORAL_TABLET | Freq: Every day | ORAL | 2 refills | Status: AC | PRN

## 2019-03-23 MED FILL — LEVOTHYROXINE 100 MCG TAB: 100 | 30 days supply | Qty: 15 | Fill #0

## 2019-03-23 MED FILL — LISINOPRIL 10 MG TABS: 10 | 30 days supply | Qty: 30 | Fill #0

## 2019-03-23 MED FILL — LORATADINE 10 MG TABLET: 10 | 30 days supply | Qty: 30 | Fill #0

## 2019-03-23 MED FILL — FLUTICASONE PROP 50 MCG SPR: 50 | 16 days supply | Qty: 16 | Fill #0

## 2019-03-23 MED FILL — AMLODIPINE BESYLATE 10 MG T: 10 | 30 days supply | Qty: 30 | Fill #0

## 2019-03-23 MED FILL — FUROSEMIDE 40 MG TAB: 40 | 30 days supply | Qty: 30 | Fill #1

## 2019-03-23 NOTE — Patient Instructions (Signed)
And increased amlodipine to 10 mg daily.  Try to check your blood pressure at least twice a week.  The goal is 130/80 or lower.

## 2019-03-23 NOTE — Progress Notes (Signed)
Patient ID: Jennifer Miller, female    DOB: Sep 22, 1965  MRN: [id number]  CC: Follow-up (6 week) and Sinus Problem   Subjective: Jennifer Miller is a 52 y.o. female who presents for chronic ds management Her concerns today include:  Pt with hx ofCHF, a.fib, HL,HTN, hypothyroid, IDAwith hx of fibroid tumors, anxietyand DM. OA knees  CHF/a.fib:  Saw cardiologist.  Had echo which revealed mild LVH and was otherwise normal.  No changes made in medications.  DM: checking BS 2-3 x a wk.  Range 130s after meals.  She stopped taking Metformin 4 to 5 months ago because blood sugars were okay.  Had eye exam done 2 months ago at [facility] [facility] Currently taking: see medication list Med Adherence: '[x]'  Yes    '[]'  No Medication side effects: '[]'  Yes    '[x]'  No Adherence with salt restriction: '[x]'  Yes    '[]'  No Home Monitoring?: '[x]'  Yes    '[]'  No Monitoring Frequency: occasionally Home BP results range: '[]'  Yes    '[]'  No SOB? '[]'  Yes    '[x]'  No Chest Pain?: '[]'  Yes    '[x]'  No Leg swelling?: '[]'  Yes    '[x]'  No Headaches?: '[]'  Yes    '[x]'  No Dizziness? '[]'  Yes    '[]'  No Comments:   Anxiety:  Did not like Zoloft.  She is managing her anxiety and other ways like trying not to think negative thoughts.  Hypothyroid: out of med x 1 wk  Reports that her sinuses are bothering her.  C/o pain at back of nose with some nasal congestion.  No facial pain.  Some drainage at back of throat No fever  HM: She declines Tdap, flu shot, Pneumovax.  She is due for colonoscopy.  She is agreeable for referral to gastroenterology Patient Active Problem List   Diagnosis Date Noted  . Generalized anxiety disorder 08/28/2018  . Controlled type 2 diabetes mellitus without complication, without long-term current use of insulin ([place]) 12/30/2017  . Essential hypertension 12/30/2017  . Iron deficiency anemia 12/30/2017  . PAF (paroxysmal atrial fibrillation) ([place]) 12/30/2017  . Acquired hypothyroidism 12/30/2017   . History of colon polyps 12/30/2017  . Hyperlipidemia 12/30/2017  . Congestive heart failure ([place]) 12/30/2017     Current Outpatient Medications on File Prior to Visit  Medication Sig Dispense Refill  . albuterol (PROVENTIL HFA;VENTOLIN HFA) 108 (90 Base) MCG/ACT inhaler Inhale 1-2 puffs into the lungs every 6 (six) hours as needed for wheezing or shortness of breath. 1 Inhaler 3  . atorvastatin (LIPITOR) 20 MG tablet Take 1 tablet (20 mg total) by mouth daily. 30 tablet 6  . Blood Glucose Monitoring Suppl (TRUE METRIX METER) w/Device KIT Use as directed 1 kit 0  . dabigatran (PRADAXA) 150 MG CAPS capsule Take 1 capsule (150 mg total) by mouth 2 (two) times daily. 60 capsule 6  . furosemide (LASIX) 40 MG tablet Take 1 tablet (40 mg total) by mouth daily. 90 tablet 1  . glucose blood (TRUE METRIX BLOOD GLUCOSE TEST) test strip Check blood sugar fasting and before meals and again if pt feels bad (symptoms of hypo). 100 each 12  . methocarbamol (ROBAXIN) 500 MG tablet Take 2 tablets (1,000 mg total) by mouth every 8 (eight) hours as needed for muscle spasms. 90 tablet 0  . PAZEO 0.7 % SOLN as needed.     . TRUEPLUS LANCETS 28G MISC Check blood sugar fasting and before meals and again if pt  feels bad (symptoms of hypo). 100 each 12   No current facility-administered medications on file prior to visit.     No Known Allergies  Social History   Socioeconomic History  . Marital status: Single    Spouse name: Not on file  . Number of children: 3  . Years of education: Not on file  . Highest education level: Not on file  Occupational History  . Occupation: unemployed  Social Needs  . Financial resource strain: Not on file  . Food insecurity    Worry: Not on file    Inability: Not on file  . Transportation needs    Medical: Not on file    Non-medical: Not on file  Tobacco Use  . Smoking status: Former Research scientist (life sciences)  . Smokeless tobacco: Never Used  Substance and Sexual Activity  . Alcohol  use: Yes    Comment: rarely  . Drug use: Never  . Sexual activity: Yes    Birth control/protection: None  Lifestyle  . Physical activity    Days per week: Not on file    Minutes per session: Not on file  . Stress: Not on file  Relationships  . Social Herbalist on phone: Not on file    Gets together: Not on file    Attends religious service: Not on file    Active member of club or organization: Not on file    Attends meetings of clubs or organizations: Not on file    Relationship status: Not on file  . Intimate partner violence    Fear of current or ex partner: Not on file    Emotionally abused: Not on file    Physically abused: Not on file    Forced sexual activity: Not on file  Other Topics Concern  . Not on file  Social History Narrative  . Not on file    Family History  Problem Relation Age of Onset  . Diabetes Mother   . Hypertension Mother   . Dementia Mother   . Diabetes Father   . Hypertension Father   . Diabetes Maternal Grandmother   . Diabetes Paternal Grandmother   . Heart disease Paternal Grandmother     Past Surgical History:  Procedure Laterality Date  . DILATION AND CURETTAGE OF UTERUS    . KNEE ARTHROSCOPY AND ARTHROTOMY      ROS: Review of Systems Negative except as stated above  PHYSICAL EXAM: BP 138/88   Pulse 80   Temp 98.1 F (36.7 C) (Oral)   Resp 16   Wt 254 lb 12.8 oz (115.6 kg)   SpO2 99%   BMI 39.91 kg/m   Physical Exam  General appearance - alert, well appearing, and in no distress Mental status - normal mood, behavior, speech, dress, motor activity, and thought processes Nose -mild enlargement of nasal turbinates Mouth - mucous membranes moist, pharynx normal without lesions Neck - supple, no significant adenopathy Chest - clear to auscultation, no wheezes, rales or rhonchi, symmetric air entry Heart - normal rate, regular rhythm, normal S1, S2, no murmurs, rubs, clicks or gallops Extremities - peripheral  pulses normal, no pedal edema, no clubbing or cyanosis   CMP Latest Ref Rng & Units 07/03/2018 05/01/2018 12/29/2017  Glucose 70 - 99 mg/dL 154(H) 177(H) 103(H)  BUN 6 - 20 mg/dL '13 17 11  ' Creatinine 0.44 - 1.00 mg/dL 0.64 0.76 0.88  Sodium 135 - 145 mmol/L 139 138 142  Potassium 3.5 - 5.1 mmol/L 3.9  3.6 5.0  Chloride 98 - 111 mmol/L 102 100 101  CO2 22 - 32 mmol/L '30 28 26  ' Calcium 8.9 - 10.3 mg/dL 9.8 9.2 10.2  Total Protein 6.5 - 8.1 g/dL - 7.9 7.7  Total Bilirubin 0.3 - 1.2 mg/dL - 0.6 0.3  Alkaline Phos 38 - 126 U/L - 52 52  AST 15 - 41 U/L - 22 18  ALT 0 - 44 U/L - 20 15   Lipid Panel     Component Value Date/Time   CHOL 148 12/29/2017 1035   TRIG 97 12/29/2017 1035   HDL 41 12/29/2017 1035   CHOLHDL 3.6 12/29/2017 1035   LDLCALC 88 12/29/2017 1035    CBC    Component Value Date/Time   WBC 5.3 07/03/2018 1055   RBC 5.13 (H) 07/03/2018 1055   HGB 13.2 07/03/2018 1055   HGB 13.1 12/29/2017 1035   HCT 41.2 07/03/2018 1055   HCT 42.2 12/29/2017 1035   PLT 329 07/03/2018 1055   PLT 365 12/29/2017 1035   MCV 80.3 07/03/2018 1055   MCV 81 12/29/2017 1035   MCH 25.7 (L) 07/03/2018 1055   MCHC 32.0 07/03/2018 1055   RDW 15.9 (H) 07/03/2018 1055   RDW 15.4 12/29/2017 1035    ASSESSMENT AND PLAN: 1. Controlled type 2 diabetes mellitus without complication, without long-term current use of insulin (HCC) Off Metformin.  Reported blood sugars are good.  Encourage healthy eating habits and regular exercise - Glucose (CBG) - CBC - Comprehensive metabolic panel - Lipid panel  2. Essential hypertension Not at goal.  Increase amlodipine to 10 mg.  Continue current dose of lisinopril and metoprolol. - amLODipine (NORVASC) 10 MG tablet; Take 1 tablet (10 mg total) by mouth daily.  Dispense: 30 tablet; Refill: 6 - lisinopril (ZESTRIL) 10 MG tablet; Take 1 tablet (10 mg total) by mouth daily.  Dispense: 30 tablet; Refill: 6 - metoprolol tartrate (LOPRESSOR) 50 MG tablet; Take 1  tablet (50 mg total) by mouth 2 (two) times daily.  Dispense: 180 tablet; Refill: 4  3. Acquired hypothyroidism Out of medication for a week.  Refill given today - levothyroxine (SYNTHROID) 100 MCG tablet; TAKE 1/2 TABLET BY MOUTH DAILY BEFORE BREAKFAST  Dispense: 15 tablet; Refill: 4 - TSH  4. Chronic congestive heart failure, unspecified heart failure type (HCC) Stable and compensated - lisinopril (ZESTRIL) 10 MG tablet; Take 1 tablet (10 mg total) by mouth daily.  Dispense: 30 tablet; Refill: 6 - metoprolol tartrate (LOPRESSOR) 50 MG tablet; Take 1 tablet (50 mg total) by mouth 2 (two) times daily.  Dispense: 180 tablet; Refill: 4  5. Generalized anxiety disorder Trying to controlled without medication.  We discussed ways to help like meditation and deep breathing exercises  6. Sinus congestion - fluticasone (FLONASE) 50 MCG/ACT nasal spray; Place 1 spray into both nostrils daily.  Dispense: 15.8 g; Refill: 6 - loratadine (CLARITIN) 10 MG tablet; Take 1 tablet (10 mg total) by mouth daily as needed for allergies.  Dispense: 30 tablet; Refill: 2  7. Screening for colon cancer - Ambulatory referral to Gastroenterology  8. Influenza vaccination declined Offered today but patient declined  9. 23-polyvalent pneumococcal polysaccharide vaccine declined Offered today but patient declined     Patient was given the opportunity to ask questions.  Patient verbalized understanding of the plan and was able to repeat key elements of the plan.   Orders Placed This Encounter  Procedures  . CBC  . Comprehensive metabolic panel  .  Lipid panel  . TSH  . Ambulatory referral to Gastroenterology  . Glucose (CBG)     Requested Prescriptions   Signed Prescriptions Disp Refills  . levothyroxine (SYNTHROID) 100 MCG tablet 15 tablet 4    Sig: TAKE 1/2 TABLET BY MOUTH DAILY BEFORE BREAKFAST  . amLODipine (NORVASC) 10 MG tablet 30 tablet 6    Sig: Take 1 tablet (10 mg total) by mouth daily.   Marland Kitchen lisinopril (ZESTRIL) 10 MG tablet 30 tablet 6    Sig: Take 1 tablet (10 mg total) by mouth daily.  . metoprolol tartrate (LOPRESSOR) 50 MG tablet 180 tablet 4    Sig: Take 1 tablet (50 mg total) by mouth 2 (two) times daily.  . fluticasone (FLONASE) 50 MCG/ACT nasal spray 15.8 g 6    Sig: Place 1 spray into both nostrils daily.  Marland Kitchen loratadine (CLARITIN) 10 MG tablet 30 tablet 2    Sig: Take 1 tablet (10 mg total) by mouth daily as needed for allergies.    Return in about 4 months (around 07/21/2019).  Karle Plumber, MD, FACP

## 2019-03-24 LAB — COMPREHENSIVE METABOLIC PANEL
ALT: 26 IU/L (ref 0–32)
AST: 21 IU/L (ref 0–40)
Albumin/Globulin Ratio: 1.3 (ref 1.2–2.2)
Albumin: 4.3 g/dL (ref 3.8–4.9)
Alkaline Phosphatase: 79 IU/L (ref 39–117)
BUN/Creatinine Ratio: 18 (ref 9–23)
BUN: 14 mg/dL (ref 6–24)
Bilirubin Total: 0.3 mg/dL (ref 0.0–1.2)
CO2: 29 mmol/L (ref 20–29)
Calcium: 9.7 mg/dL (ref 8.7–10.2)
Chloride: 100 mmol/L (ref 96–106)
Creatinine, Ser: 0.77 mg/dL (ref 0.57–1.00)
GFR calc Af Amer: 103 mL/min/{1.73_m2} (ref >59)
GFR calc non Af Amer: 89 mL/min/{1.73_m2} (ref >59)
Globulin, Total: 3.4 g/dL (ref 1.5–4.5)
Glucose: 116 mg/dL — ABNORMAL HIGH (ref 65–99)
Potassium: 4.3 mmol/L (ref 3.5–5.2)
Sodium: 140 mmol/L (ref 134–144)
Total Protein: 7.7 g/dL (ref 6.0–8.5)

## 2019-03-24 LAB — CBC
Hematocrit: 42.8 % (ref 34.0–46.6)
Hemoglobin: 13.8 g/dL (ref 11.1–15.9)
MCH: 25.9 pg — ABNORMAL LOW (ref 26.6–33.0)
MCHC: 32.2 g/dL (ref 31.5–35.7)
MCV: 81 fL (ref 79–97)
Platelets: 319 10*3/uL (ref 150–450)
RBC: 5.32 x10E6/uL — ABNORMAL HIGH (ref 3.77–5.28)
RDW: 14.8 % (ref 11.7–15.4)
WBC: 9.7 10*3/uL (ref 3.4–10.8)

## 2019-03-24 LAB — TSH: TSH: 2.16 u[IU]/mL (ref 0.450–4.500)

## 2019-03-24 LAB — LIPID PANEL
Chol/HDL Ratio: 3.1 ratio (ref 0.0–4.4)
Cholesterol, Total: 134 mg/dL (ref 100–199)
HDL: 43 mg/dL (ref >39)
LDL Chol Calc (NIH): 76 mg/dL (ref 0–99)
Triglycerides: 72 mg/dL (ref 0–149)
VLDL Cholesterol Cal: 15 mg/dL (ref 5–40)

## 2019-03-26 NOTE — Addendum Note (Signed)
Addended by: Boykin Reaper R on: 03/26/2019 04:09 PM   Modules accepted: Orders

## 2019-03-28 ENCOUNTER — Telehealth: Payer: Self-pay

## 2019-03-28 NOTE — Telephone Encounter (Signed)
Contacted pt to go over lab results pt is aware and doesn't have any questions or concerns 

## 2019-03-29 LAB — HEMOGLOBIN A1C
Est. average glucose Bld gHb Est-mCnc: 157 mg/dL
Hgb A1c MFr Bld: 7.1 % — ABNORMAL HIGH (ref 4.8–5.6)

## 2019-03-29 LAB — SPECIMEN STATUS REPORT

## 2019-03-30 ENCOUNTER — Other Ambulatory Visit: Payer: Self-pay | Admitting: Internal Medicine

## 2019-03-30 DIAGNOSIS — E1169 Type 2 diabetes mellitus with other specified complication: Secondary | ICD-10-CM

## 2019-03-30 DIAGNOSIS — E669 Obesity, unspecified: Secondary | ICD-10-CM

## 2019-03-30 MED ORDER — METFORMIN HCL 500 MG PO TABS: 500.0000 mg | ORAL_TABLET | Freq: Every day | ORAL | 3 refills | Status: DC

## 2019-04-02 ENCOUNTER — Telehealth: Payer: Self-pay

## 2019-04-02 NOTE — Telephone Encounter (Signed)
Contacted pt to go over lab results pt didn't answer left a detailed vm informing pt of results and if she has any questions or concerns to give me a call  

## 2019-04-25 MED FILL — LEVOTHYROXINE 100 MCG TAB: 100 | 30 days supply | Qty: 15 | Fill #1

## 2019-04-25 MED FILL — FUROSEMIDE 40 MG TAB: 40 | 30 days supply | Qty: 30 | Fill #2

## 2019-04-25 MED FILL — LISINOPRIL 10 MG TABS: 10 | 30 days supply | Qty: 30 | Fill #1

## 2019-04-25 MED FILL — PRADAXA 150 MG CAP: 150 | 30 days supply | Qty: 60 | Fill #0

## 2019-04-25 MED FILL — AMLODIPINE BESYLATE 10 MG T: 10 | 30 days supply | Qty: 30 | Fill #1

## 2019-05-25 ENCOUNTER — Other Ambulatory Visit: Payer: Self-pay | Admitting: Internal Medicine

## 2019-05-25 DIAGNOSIS — I509 Heart failure, unspecified: Secondary | ICD-10-CM

## 2019-05-25 MED FILL — LISINOPRIL 10 MG TABS: 10 | 30 days supply | Qty: 30 | Fill #2

## 2019-05-25 MED FILL — LEVOTHYROXINE 100 MCG TAB: 100 | 30 days supply | Qty: 15 | Fill #2

## 2019-05-25 MED FILL — AMLODIPINE BESYLATE 10 MG T: 10 | 30 days supply | Qty: 30 | Fill #2

## 2019-05-25 MED FILL — FUROSEMIDE 40 MG TAB: 40 | 90 days supply | Qty: 90 | Fill #0

## 2019-05-25 MED FILL — LORATADINE 10 MG TABLET: 10 | 30 days supply | Qty: 30 | Fill #1

## 2019-05-28 ENCOUNTER — Other Ambulatory Visit: Payer: Self-pay | Admitting: Internal Medicine

## 2019-05-28 DIAGNOSIS — I480 Paroxysmal atrial fibrillation: Secondary | ICD-10-CM

## 2019-05-29 MED FILL — PRADAXA 150 MG CAP: 150 | 30 days supply | Qty: 60 | Fill #0

## 2019-06-25 MED FILL — LISINOPRIL 10 MG TABS: 10 | 90 days supply | Qty: 90 | Fill #3

## 2019-06-25 MED FILL — AMLODIPINE BESYLATE 10 MG T: 10 | 90 days supply | Qty: 90 | Fill #3

## 2019-06-25 MED FILL — METOPROLOL TARTRATE 50 MG T: 50 | 90 days supply | Qty: 180 | Fill #0

## 2019-07-03 MED FILL — PRADAXA 150 MG CAP: 150 | 30 days supply | Qty: 60 | Fill #1

## 2019-07-20 ENCOUNTER — Ambulatory Visit: Payer: Medicaid Other | Admitting: Internal Medicine

## 2019-07-24 ENCOUNTER — Other Ambulatory Visit: Payer: Self-pay

## 2019-07-24 ENCOUNTER — Ambulatory Visit: Payer: Medicaid Other | Attending: Internal Medicine | Admitting: Internal Medicine

## 2019-07-24 DIAGNOSIS — E039 Hypothyroidism, unspecified: Secondary | ICD-10-CM | POA: Diagnosis not present

## 2019-07-24 DIAGNOSIS — I480 Paroxysmal atrial fibrillation: Secondary | ICD-10-CM

## 2019-07-24 DIAGNOSIS — Z1211 Encounter for screening for malignant neoplasm of colon: Secondary | ICD-10-CM

## 2019-07-24 DIAGNOSIS — E1169 Type 2 diabetes mellitus with other specified complication: Secondary | ICD-10-CM

## 2019-07-24 DIAGNOSIS — E669 Obesity, unspecified: Secondary | ICD-10-CM | POA: Diagnosis not present

## 2019-07-24 DIAGNOSIS — R600 Localized edema: Secondary | ICD-10-CM | POA: Diagnosis not present

## 2019-07-24 DIAGNOSIS — I10 Essential (primary) hypertension: Secondary | ICD-10-CM | POA: Diagnosis not present

## 2019-07-24 DIAGNOSIS — I509 Heart failure, unspecified: Secondary | ICD-10-CM

## 2019-07-24 NOTE — Addendum Note (Signed)
Addended by: Jonah Blue B on: 07/24/2019 05:34 PM   Modules accepted: Level of Service

## 2019-07-24 NOTE — Progress Notes (Signed)
Virtual Visit via Video Note Due to current restrictions/limitations of in-office visits due to the COVID-19 pandemic, this scheduled clinical appointment was converted to a telehealth visit  I connected with Jennifer Miller on 07/24/19 at 4:40 p.m by a video enabled telemedicine application and verified that I am speaking with the correct person using two identifiers. I am in my office.  The patient is at home.  Only the patient and myself participated in this encounter.  I discussed the limitations of evaluation and management by telemedicine and the availability of in person appointments. The patient expressed understanding and agreed to proceed.  History of Present Illness: Pt with hx ofCHF, a.fib, HL,HTN, hypothyroid, IDAwith hx of fibroid tumors, anxietyand DM. OA knees.  Last seen 03/23/2019  DM: Last A1c was 7.1 in November of last year.  She restarted Metformin.  She is doing okay on the medication.  On last visit her weight was 255 pounds.  At this time she states her weight is about 260 pounds.  She does not feel that her eating habits or the amount that she eats have changed.  She attributes the weight gain to not moving as much since the pandemic.  HYPERTENSION/CHF/A.fib Currently taking: see medication list Med Adherence: '[x]'  Yes    '[]'  No Medication side effects: '[]'  Yes    '[x]'  No Adherence with salt restriction: '[]'  Yes    '[x]'  No-she usually does but states that recently she has been eating a little bit more salted foods. Home Monitoring?: '[x]'  Yes    '[]'  No Monitoring Frequency: Intermittently. Home BP results range: She check blood pressure today while she was on video with me.  Blood pressure was 118/77 and pulse of 85. SOB? '[]'  Yes    '[x]'  No Chest Pain?: '[]'  Yes    '[x]'  No Leg swelling?: '[x]'  Yes  -reports mild swelling in both legs from the knees down including the ankles for the past 1 week.  She also feels a stinging sensation in the legs.  She is compliant with taking  furosemide.  She had misplaced her bottle of levothyroxine and had missed taking it for about 2 to 3 days.  She denies any PND orthopnea.  She does a lot of standing at work and wonders whether that may be playing a role also.  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 Headaches?: '[]'  Yes    '[x]'  No Dizziness? '[]'  Yes    '[x]'  No Comments: She is still taking Pradaxa.  She denies any bruising or bleeding on the medication.  HM: On last visit we had discussed doing colon cancer screening with colonoscopy.  However patient when she was called declined because she needed to have a Covid test done prior to the procedure.  She states that she did not want them sticking anything in her nose.  Outpatient Encounter Medications as of 07/24/2019  Medication Sig  . albuterol (PROVENTIL HFA;VENTOLIN HFA) 108 (90 Base) MCG/ACT inhaler Inhale 1-2 puffs into the lungs every 6 (six) hours as needed for wheezing or shortness of breath.  Marland Kitchen amLODipine (NORVASC) 10 MG tablet Take 1 tablet (10 mg total) by mouth daily.  Marland Kitchen atorvastatin (LIPITOR) 20 MG tablet Take 1 tablet (20 mg total) by mouth daily.  . Blood Glucose Monitoring Suppl (TRUE METRIX METER) w/Device KIT Use as directed  . fluticasone (FLONASE) 50 MCG/ACT nasal spray Place 1 spray into both nostrils daily.  . furosemide (LASIX) 40 MG tablet  TAKE 1 TABLET (40 MG TOTAL) BY MOUTH DAILY.  Marland Kitchen glucose blood (TRUE METRIX BLOOD GLUCOSE TEST) test strip Check blood sugar fasting and before meals and again if pt feels bad (symptoms of hypo).  Marland Kitchen levothyroxine (SYNTHROID) 100 MCG tablet TAKE 1/2 TABLET BY MOUTH DAILY BEFORE BREAKFAST  . lisinopril (ZESTRIL) 10 MG tablet Take 1 tablet (10 mg total) by mouth daily.  Marland Kitchen loratadine (CLARITIN) 10 MG tablet Take 1 tablet (10 mg total) by mouth daily as needed for allergies.  . metFORMIN (GLUCOPHAGE) 500 MG tablet Take 1 tablet (500 mg total) by mouth daily with breakfast. E11.9 Diabetes type 2 controlled   . methocarbamol (ROBAXIN) 500 MG tablet Take 2 tablets (1,000 mg total) by mouth every 8 (eight) hours as needed for muscle spasms.  . metoprolol tartrate (LOPRESSOR) 50 MG tablet Take 1 tablet (50 mg total) by mouth 2 (two) times daily.  Marland Kitchen PAZEO 0.7 % SOLN as needed.   Marland Kitchen PRADAXA 150 MG CAPS capsule TAKE 1 CAPSULE (150 MG TOTAL) BY MOUTH 2 (TWO) TIMES DAILY.  Marland Kitchen TRUEPLUS LANCETS 28G MISC Check blood sugar fasting and before meals and again if pt feels bad (symptoms of hypo).   No facility-administered encounter medications on file as of 07/24/2019.      Observations/Objective: Results for orders placed or performed in visit on 03/23/19  CBC  Result Value Ref Range   WBC 9.7 3.4 - 10.8 x10E3/uL   RBC 5.32 (H) 3.77 - 5.28 x10E6/uL   Hemoglobin 13.8 11.1 - 15.9 g/dL   Hematocrit 42.8 34.0 - 46.6 %   MCV 81 79 - 97 fL   MCH 25.9 (L) 26.6 - 33.0 pg   MCHC 32.2 31.5 - 35.7 g/dL   RDW 14.8 11.7 - 15.4 %   Platelets 319 150 - 450 x10E3/uL  Comprehensive metabolic panel  Result Value Ref Range   Glucose 116 (H) 65 - 99 mg/dL   BUN 14 6 - 24 mg/dL   Creatinine, Ser 0.77 0.57 - 1.00 mg/dL   GFR calc non Af Amer 89 >59 mL/min/1.73   GFR calc Af Amer 103 >59 mL/min/1.73   BUN/Creatinine Ratio 18 9 - 23   Sodium 140 134 - 144 mmol/L   Potassium 4.3 3.5 - 5.2 mmol/L   Chloride 100 96 - 106 mmol/L   CO2 29 20 - 29 mmol/L   Calcium 9.7 8.7 - 10.2 mg/dL   Total Protein 7.7 6.0 - 8.5 g/dL   Albumin 4.3 3.8 - 4.9 g/dL   Globulin, Total 3.4 1.5 - 4.5 g/dL   Albumin/Globulin Ratio 1.3 1.2 - 2.2   Bilirubin Total 0.3 0.0 - 1.2 mg/dL   Alkaline Phosphatase 79 39 - 117 IU/L   AST 21 0 - 40 IU/L   ALT 26 0 - 32 IU/L  Lipid panel  Result Value Ref Range   Cholesterol, Total 134 100 - 199 mg/dL   Triglycerides 72 0 - 149 mg/dL   HDL 43 >39 mg/dL   VLDL Cholesterol Cal 15 5 - 40 mg/dL   LDL Chol Calc (NIH) 76 0 - 99 mg/dL   Chol/HDL Ratio 3.1 0.0 - 4.4 ratio  TSH  Result Value Ref Range    TSH 2.160 0.450 - 4.500 uIU/mL  Hemoglobin A1c(blood test)  Result Value Ref Range   Hgb A1c MFr Bld 7.1 (H) 4.8 - 5.6 %   Est. average glucose Bld gHb Est-mCnc 157 mg/dL  Specimen status report  Result Value Ref Range  specimen status report Comment   Glucose (CBG)  Result Value Ref Range   POC Glucose 131 (A) 70 - 99 mg/dl     Assessment and Plan: 1. Type 2 diabetes mellitus with obesity (HCC) Discussed and encourage healthy eating habits. Encouraged her to move more. Continue Metformin  2. Essential hypertension At goal.  Continue current medications including metoprolol amlodipine and lisinopril  3. Chronic congestive heart failure, unspecified heart failure type [facility]) Patient having some lower extremity edema without PND or shortness of breath with exertion.  She admits to dietary indiscretions with increased salt over the past week.  Other thing that can be playing a role is dependent edema given that she stands on her feet most of the time during her 6-hour shifts. I have recommended increasing furosemide from 40 mg daily to 60 mg daily for the next 3 days then going back to 40 mg a day. Advised to cut back on salt intake. Advised to purchase and wear a pair of compression socks during her shifts. - Brain natriuretic peptide; Future  4. Edema of both lower legs See #3 above - Brain natriuretic peptide; Future  5. PAF (paroxysmal atrial fibrillation) (HCC) Stable on Pradaxa and metoprolol - CBC; Future - Basic Metabolic Panel; Future  6. Acquired hypothyroidism Continue levothyroxine - TSH; Future  7. Screening for colon cancer We discussed other methods of doing colon cancer screening.  Patient is agreeable to doing the Cologuard test which is okay since she is not at high risk for colon cancer. - Cologuard   Follow Up Instructions: 3 mths or sooner if lower extremity edema persists.   I discussed the assessment and treatment plan with the patient. The  patient was provided an opportunity to ask questions and all were answered. The patient agreed with the plan and demonstrated an understanding of the instructions.   The patient was advised to call back or seek an in-person evaluation if the symptoms worsen or if the condition fails to improve as anticipated.  I provided 12 minutes of non-face-to-face time during this encounter.   Karle Plumber, MD

## 2019-07-25 ENCOUNTER — Other Ambulatory Visit: Payer: Self-pay

## 2019-07-25 ENCOUNTER — Ambulatory Visit: Payer: Medicaid Other | Attending: Internal Medicine

## 2019-07-25 DIAGNOSIS — R600 Localized edema: Secondary | ICD-10-CM

## 2019-07-25 DIAGNOSIS — E039 Hypothyroidism, unspecified: Secondary | ICD-10-CM

## 2019-07-25 DIAGNOSIS — I480 Paroxysmal atrial fibrillation: Secondary | ICD-10-CM

## 2019-07-25 DIAGNOSIS — I509 Heart failure, unspecified: Secondary | ICD-10-CM

## 2019-07-26 LAB — CBC
Hematocrit: 41.8 % (ref 34.0–46.6)
Hemoglobin: 13.4 g/dL (ref 11.1–15.9)
MCH: 26.1 pg — ABNORMAL LOW (ref 26.6–33.0)
MCHC: 32.1 g/dL (ref 31.5–35.7)
MCV: 82 fL (ref 79–97)
Platelets: 324 10*3/uL (ref 150–450)
RBC: 5.13 x10E6/uL (ref 3.77–5.28)
RDW: 15.3 % (ref 11.7–15.4)
WBC: 6.7 10*3/uL (ref 3.4–10.8)

## 2019-07-26 LAB — BASIC METABOLIC PANEL
BUN/Creatinine Ratio: 16 (ref 9–23)
BUN: 12 mg/dL (ref 6–24)
CO2: 25 mmol/L (ref 20–29)
Calcium: 9.9 mg/dL (ref 8.7–10.2)
Chloride: 100 mmol/L (ref 96–106)
Creatinine, Ser: 0.76 mg/dL (ref 0.57–1.00)
GFR calc Af Amer: 104 mL/min/{1.73_m2} (ref >59)
GFR calc non Af Amer: 90 mL/min/{1.73_m2} (ref >59)
Glucose: 113 mg/dL — ABNORMAL HIGH (ref 65–99)
Potassium: 4.7 mmol/L (ref 3.5–5.2)
Sodium: 138 mmol/L (ref 134–144)

## 2019-07-26 LAB — TSH: TSH: 2.33 u[IU]/mL (ref 0.450–4.500)

## 2019-07-26 LAB — BRAIN NATRIURETIC PEPTIDE: BNP: 4.6 pg/mL (ref 0.0–100.0)

## 2019-08-08 MED FILL — PRADAXA 150 MG CAP: 150 | 30 days supply | Qty: 60 | Fill #2

## 2019-08-08 MED FILL — LEVOTHYROXINE SODIUM 100 MC: 100 | 30 days supply | Qty: 15 | Fill #3

## 2019-08-10 ENCOUNTER — Ambulatory Visit: Payer: Medicaid Other | Admitting: Cardiology

## 2019-08-14 ENCOUNTER — Encounter: Payer: Self-pay | Admitting: Cardiology

## 2019-08-14 ENCOUNTER — Ambulatory Visit (INDEPENDENT_AMBULATORY_CARE_PROVIDER_SITE_OTHER): Payer: Medicaid Other | Admitting: Cardiology

## 2019-08-14 ENCOUNTER — Other Ambulatory Visit: Payer: Self-pay

## 2019-08-14 VITALS — BP 122/88 | HR 82 | Ht 66.5 in | Wt 256.0 lb

## 2019-08-14 DIAGNOSIS — I10 Essential (primary) hypertension: Secondary | ICD-10-CM

## 2019-08-14 DIAGNOSIS — I480 Paroxysmal atrial fibrillation: Secondary | ICD-10-CM | POA: Diagnosis not present

## 2019-08-14 DIAGNOSIS — I509 Heart failure, unspecified: Secondary | ICD-10-CM | POA: Diagnosis not present

## 2019-08-14 DIAGNOSIS — R600 Localized edema: Secondary | ICD-10-CM

## 2019-08-14 MED ORDER — FUROSEMIDE 40 MG PO TABS: 40.0000 mg | ORAL_TABLET | Freq: Every day | ORAL | Status: DC | PRN

## 2019-08-14 MED ORDER — LISINOPRIL 20 MG PO TABS: 20.0000 mg | ORAL_TABLET | Freq: Every day | ORAL | 5 refills | Status: DC

## 2019-08-14 NOTE — Progress Notes (Signed)
Cardiology Office Note:    Date:  08/14/2019   ID:  Jennifer Miller, DOB 07-18-1965, MRN [id number]  PCP:  Ladell Pier, MD  Cardiologist:  Kate Sable, MD  Electrophysiologist:  None   Referring MD: Ladell Pier, MD   Chief Complaint  Patient presents with  . Other    Past due follow up. LS 01/2019. Patient c.o chest cramps, pain/swelling in legs.     History of Present Illness:    Jennifer Miller is a 54 y.o. female with a hx of hypertension, diabetes, heart failure who presents for follow-up.  She was last seen to establish care due to history of CHF.  She describes history of CHF after having a baby years ago.  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  Echocardiogram was ordered to evaluate any structural abnormalities.  Patient also complains of lower extremity edema and some leg pains which sometimes feels like the skin of her legs are inflamed.  Historical notes Patient was diagnosed with congestive heart failure about 19 years ago after having a baby.  She was fine before.  She recently moved to the area from [place].  She was placed on medications including Metroprolol tartrate, lisinopril, Lasix 40 mg every day.  She denies edema, orthopnea.  About 6 years ago, she states eating 3 hot dogs and drinking some soda while at her mother's home.  She later on noticed her heart was beating fast and irregular.  This prompted her to go to the ED.  She was evaluated and told she was in atrial fibrillation.  She was started on anticoagulation which she has taken since, currently takes Pradaxa twice daily.  She is a former smoker, denies any history of MI.   Past Medical History:  Diagnosis Date  . Congestive heart failure (CHF) ([place])   . Diabetes mellitus without complication ([place])   . HLD (hyperlipidemia)   . Hypertension   . Paroxysmal A-fib [facility])     Past Surgical History:  Procedure Laterality Date  . DILATION AND CURETTAGE OF UTERUS    . KNEE ARTHROSCOPY AND  ARTHROTOMY      Current Medications: Current Meds  Medication Sig  . albuterol (PROVENTIL HFA;VENTOLIN HFA) 108 (90 Base) MCG/ACT inhaler Inhale 1-2 puffs into the lungs every 6 (six) hours as needed for wheezing or shortness of breath.  Marland Kitchen atorvastatin (LIPITOR) 20 MG tablet Take 1 tablet (20 mg total) by mouth daily.  . Blood Glucose Monitoring Suppl (TRUE METRIX METER) w/Device KIT Use as directed  . fluticasone (FLONASE) 50 MCG/ACT nasal spray Place 1 spray into both nostrils daily.  . furosemide (LASIX) 40 MG tablet Take 1 tablet (40 mg total) by mouth daily as needed.  Marland Kitchen glucose blood (TRUE METRIX BLOOD GLUCOSE TEST) test strip Check blood sugar fasting and before meals and again if pt feels bad (symptoms of hypo).  Marland Kitchen levothyroxine (SYNTHROID) 100 MCG tablet TAKE 1/2 TABLET BY MOUTH DAILY BEFORE BREAKFAST  . lisinopril (ZESTRIL) 20 MG tablet Take 1 tablet (20 mg total) by mouth daily.  Marland Kitchen loratadine (CLARITIN) 10 MG tablet Take 1 tablet (10 mg total) by mouth daily as needed for allergies.  . metFORMIN (GLUCOPHAGE) 500 MG tablet Take 1 tablet (500 mg total) by mouth daily with breakfast. E11.9 Diabetes type 2 controlled  . methocarbamol (ROBAXIN) 500 MG tablet Take 2 tablets (1,000 mg total) by mouth every 8 (eight) hours as needed for muscle spasms.  . metoprolol tartrate (LOPRESSOR) 50 MG tablet Take 1  tablet (50 mg total) by mouth 2 (two) times daily.  Marland Kitchen PAZEO 0.7 % SOLN as needed.   Marland Kitchen PRADAXA 150 MG CAPS capsule TAKE 1 CAPSULE (150 MG TOTAL) BY MOUTH 2 (TWO) TIMES DAILY.  Marland Kitchen TRUEPLUS LANCETS 28G MISC Check blood sugar fasting and before meals and again if pt feels bad (symptoms of hypo).  . [DISCONTINUED] amLODipine (NORVASC) 10 MG tablet Take 1 tablet (10 mg total) by mouth daily.  . [DISCONTINUED] furosemide (LASIX) 40 MG tablet TAKE 1 TABLET (40 MG TOTAL) BY MOUTH DAILY.  . [DISCONTINUED] lisinopril (ZESTRIL) 10 MG tablet Take 1 tablet (10 mg total) by mouth daily.     Allergies:    Patient has no known allergies.   Social History   Socioeconomic History  . Marital status: Single    Spouse name: Not on file  . Number of children: 3  . Years of education: Not on file  . Highest education level: Not on file  Occupational History  . Occupation: unemployed  Tobacco Use  . Smoking status: Former Research scientist (life sciences)  . Smokeless tobacco: Never Used  Substance and Sexual Activity  . Alcohol use: Yes    Comment: rarely  . Drug use: Never  . Sexual activity: Yes    Birth control/protection: None  Other Topics Concern  . Not on file  Social History Narrative  . Not on file   Social Determinants of Health   Financial Resource Strain:   . Difficulty of Paying Living Expenses:   Food Insecurity:   . Worried About Charity fundraiser in the Last Year:   . Arboriculturist in the Last Year:   Transportation Needs:   . Film/video editor (Medical):   Marland Kitchen Lack of Transportation (Non-Medical):   Physical Activity:   . Days of Exercise per Week:   . Minutes of Exercise per Session:   Stress:   . Feeling of Stress :   Social Connections:   . Frequency of Communication with Friends and Family:   . Frequency of Social Gatherings with Friends and Family:   . Attends Religious Services:   . Active Member of Clubs or Organizations:   . Attends Archivist Meetings:   Marland Kitchen Marital Status:      Family History: The patient's family history includes Dementia in her mother; Diabetes in her father, maternal grandmother, mother, and paternal grandmother; Heart disease in her paternal grandmother; Hypertension in her father and mother.  ROS:   Please see the history of present illness.     All other systems reviewed and are negative.  EKGs/Labs/Other Studies Reviewed:    The following studies were reviewed today:   EKG:  EKG is  ordered today.  The ekg ordered today demonstrates normal sinus rhythm, low voltage QRS   Recent Labs: 03/23/2019: ALT 26 07/25/2019: BNP  4.6; BUN 12; Creatinine, Ser 0.76; Hemoglobin 13.4; Platelets 324; Potassium 4.7; Sodium 138; TSH 2.330  Recent Lipid Panel    Component Value Date/Time   CHOL 134 03/23/2019 1643   TRIG 72 03/23/2019 1643   HDL 43 03/23/2019 1643   CHOLHDL 3.1 03/23/2019 1643   LDLCALC 76 03/23/2019 1643    Physical Exam:    VS:  BP 122/88 (BP Location: Left Arm, Patient Position: Sitting, Cuff Size: Normal)   Pulse 82   Ht 5' 6.5" (1.689 m)   Wt 256 lb (116.1 kg)   SpO2 96%   BMI 40.70 kg/m     Wt  Readings from Last 3 Encounters:  08/14/19 256 lb (116.1 kg)  03/23/19 254 lb 12.8 oz (115.6 kg)  01/22/19 253 lb (114.8 kg)     GEN:  Well nourished, well developed in no acute distress HEENT: Normal NECK: No JVD; No carotid bruits LYMPHATICS: No lymphadenopathy CARDIAC: RRR, no murmurs, rubs, gallops RESPIRATORY:  Clear to auscultation without rales, wheezing or rhonchi  ABDOMEN: Soft, non-tender, non-distended, obese MUSCULOSKELETAL: Trace edema; No deformity  SKIN: Warm and dry NEUROLOGIC:  Alert and oriented x 3 PSYCHIATRIC:  Normal affect   ASSESSMENT:    1. Chronic congestive heart failure, unspecified heart failure type ([place])   2. Edema leg   3. PAF (paroxysmal atrial fibrillation) ([place])   4. Essential hypertension    PLAN:    In order of problems listed above;  1. Patient with documented history of CHF.  Echocardiogram on 03/2019 showed normal ejection fraction with EF 55 to 60%, mild LVH, impaired relaxation.  Continue management for blood pressure as noted below. 2. Lower extremity edema noted on physical exam.  This could be secondary to amlodipine.  We will stop amlodipine.  Patient advised to use Lasix as needed.  Advised patient to follow-up with PCP regarding leg pain/burning sensation.  This could be neuropathy secondary to diabetes. 3. History of paroxysmal A. fib, currently in sinus rhythm.  Continue Lopressor 50 mg twice daily, continue Pradaxa twice  daily. 4. History of hypertension.  Blood pressure well controlled.  Increase lisinopril to 20 mg daily.  Continue Lopressor 50 twice daily.  Stop amlodipine due to edema as above.  Follow-up in 3 weeks.   Medication Adjustments/Labs and Tests Ordered: Current medicines are reviewed at length with the patient today.  Concerns regarding medicines are outlined above.  Orders Placed This Encounter  Procedures  . EKG 12-Lead   Meds ordered this encounter  Medications  . furosemide (LASIX) 40 MG tablet    Sig: Take 1 tablet (40 mg total) by mouth daily as needed.  Marland Kitchen lisinopril (ZESTRIL) 20 MG tablet    Sig: Take 1 tablet (20 mg total) by mouth daily.    Dispense:  30 tablet    Refill:  5    Dosage Increase    Patient Instructions  Medication Instructions:  Your physician has recommended you make the following change in your medication:   1) STOP Amlodipine 2) CHANGE Lasix to 40 mg daily as needed. 3) INCREASE Lisinopril to 20 mg daily. An Rx has been sent to your pharmacy.  *If you need a refill on your cardiac medications before your next appointment, please call your pharmacy*   Lab Work: None ordered If you have labs (blood work) drawn today and your tests are completely normal, you will receive your results only by: Marland Kitchen MyChart Message (if you have MyChart) OR . A paper copy in the mail If you have any lab test that is abnormal or we need to change your treatment, we will call you to review the results.   Testing/Procedures: None ordered   Follow-Up: At [facility], you and your health needs are our priority.  As part of our continuing mission to provide you with exceptional heart care, we have created designated Provider Care Teams.  These Care Teams include your primary Cardiologist (physician) and Advanced Practice Providers (APPs -  Physician Assistants and Nurse Practitioners) who all work together to provide you with the care you need, when you need it.  We  recommend signing up for the  patient portal called "MyChart".  Sign up information is provided on this After Visit Summary.  MyChart is used to connect with patients for Virtual Visits (Telemedicine).  Patients are able to view lab/test results, encounter notes, upcoming appointments, etc.  Non-urgent messages can be sent to your provider as well.   To learn more about what you can do with MyChart, go to [email or web address].    Your next appointment:   3 week(s)  The format for your next appointment:   In Person  Provider:   Kate Sable, MD   Other Instructions N/A     Signed, Kate Sable, MD  08/14/2019 10:27 AM    [place]

## 2019-08-14 NOTE — Patient Instructions (Signed)
Medication Instructions:  Your physician has recommended you make the following change in your medication:   1) STOP Amlodipine 2) CHANGE Lasix to 40 mg daily as needed. 3) INCREASE Lisinopril to 20 mg daily. An Rx has been sent to your pharmacy.  *If you need a refill on your cardiac medications before your next appointment, please call your pharmacy*   Lab Work: None ordered If you have labs (blood work) drawn today and your tests are completely normal, you will receive your results only by: Marland Kitchen MyChart Message (if you have MyChart) OR . A paper copy in the mail If you have any lab test that is abnormal or we need to change your treatment, we will call you to review the results.   Testing/Procedures: None ordered   Follow-Up: At [facility], you and your health needs are our priority.  As part of our continuing mission to provide you with exceptional heart care, we have created designated Provider Care Teams.  These Care Teams include your primary Cardiologist (physician) and Advanced Practice Providers (APPs -  Physician Assistants and Nurse Practitioners) who all work together to provide you with the care you need, when you need it.  We recommend signing up for the patient portal called "MyChart".  Sign up information is provided on this After Visit Summary.  MyChart is used to connect with patients for Virtual Visits (Telemedicine).  Patients are able to view lab/test results, encounter notes, upcoming appointments, etc.  Non-urgent messages can be sent to your provider as well.   To learn more about what you can do with MyChart, go to [email or web address].    Your next appointment:   3 week(s)  The format for your next appointment:   In Person  Provider:   Debbe Odea, MD   Other Instructions N/A

## 2019-08-24 DIAGNOSIS — Z1211 Encounter for screening for malignant neoplasm of colon: Secondary | ICD-10-CM | POA: Diagnosis not present

## 2019-08-29 LAB — COLOGUARD
COLOGUARD: NEGATIVE
Cologuard: NEGATIVE

## 2019-08-29 LAB — EXTERNAL GENERIC LAB PROCEDURE: COLOGUARD: NEGATIVE

## 2019-09-04 ENCOUNTER — Ambulatory Visit (INDEPENDENT_AMBULATORY_CARE_PROVIDER_SITE_OTHER): Payer: Medicaid Other | Admitting: Cardiology

## 2019-09-04 ENCOUNTER — Encounter: Payer: Self-pay | Admitting: Cardiology

## 2019-09-04 ENCOUNTER — Other Ambulatory Visit: Payer: Self-pay

## 2019-09-04 VITALS — BP 150/90 | HR 78 | Ht 67.0 in | Wt 258.5 lb

## 2019-09-04 DIAGNOSIS — I480 Paroxysmal atrial fibrillation: Secondary | ICD-10-CM | POA: Diagnosis not present

## 2019-09-04 DIAGNOSIS — I10 Essential (primary) hypertension: Secondary | ICD-10-CM

## 2019-09-04 DIAGNOSIS — I509 Heart failure, unspecified: Secondary | ICD-10-CM | POA: Diagnosis not present

## 2019-09-04 DIAGNOSIS — R600 Localized edema: Secondary | ICD-10-CM

## 2019-09-04 MED ORDER — FUROSEMIDE 40 MG PO TABS: 40.0000 mg | ORAL_TABLET | Freq: Every day | ORAL | 4 refills | Status: DC | PRN

## 2019-09-04 MED ORDER — LISINOPRIL 20 MG PO TABS: 20.0000 mg | ORAL_TABLET | Freq: Two times a day (BID) | ORAL | 3 refills | Status: DC

## 2019-09-04 MED FILL — LISINOPRIL 20 MG TABLET: 20 | 90 days supply | Qty: 180 | Fill #0

## 2019-09-04 MED FILL — FUROSEMIDE 40 MG TAB: 40 | 90 days supply | Qty: 90 | Fill #0

## 2019-09-04 NOTE — Progress Notes (Signed)
Cardiology Office Note:    Date:  09/04/2019   ID:  Jennifer Miller, DOB 23-Jun-1965, MRN [id number]  PCP:  Ladell Pier, MD  Cardiologist:  Kate Sable, MD  Electrophysiologist:  None   Referring MD: Ladell Pier, MD   Chief Complaint  Patient presents with  . office visit    3 week F/U; Meds verbally reviewed with patient.    History of Present Illness:    Jennifer Miller is a 54 y.o. female with a hx of hypertension, paroxysmal atrial fibrillation on Pradaxa, diabetes,who presents for follow-up.  She was last seen due to edema and blood pressure control.  Amlodipine was stopped as this might be contributing to edema.  Lisinopril was increased to 20 mg daily.  Echocardiogram on 03/2019 showed normal systolic function, EF 55 to 60%, mild LVH, impaired relaxation.  Patient states feeling okay, her edema has improved.  She does not check her blood pressure at home frequently and cannot report blood pressure readings at home. She still has some burning sensation in her leg.  Historical notes Patient was diagnosed with congestive heart failure about 19 years ago after having a baby.  She was fine before.  She recently moved to the area from [place].  She was placed on medications including Metroprolol tartrate, lisinopril, Lasix 40 mg every day.  She denies edema, orthopnea.  About 6 years ago, she states eating 3 hot dogs and drinking some soda while at her mother's home.  She later on noticed her heart was beating fast and irregular.  This prompted her to go to the ED.  She was evaluated and told she was in atrial fibrillation.  She was started on anticoagulation which she has taken since, currently takes Pradaxa twice daily.  She is a former smoker, denies any history of MI.   Past Medical History:  Diagnosis Date  . Congestive heart failure (CHF) ([place])   . Diabetes mellitus without complication ([place])   . HLD (hyperlipidemia)   . Hypertension   . Paroxysmal A-fib [facility])      Past Surgical History:  Procedure Laterality Date  . DILATION AND CURETTAGE OF UTERUS    . KNEE ARTHROSCOPY AND ARTHROTOMY      Current Medications: Current Meds  Medication Sig  . albuterol (PROVENTIL HFA;VENTOLIN HFA) 108 (90 Base) MCG/ACT inhaler Inhale 1-2 puffs into the lungs every 6 (six) hours as needed for wheezing or shortness of breath.  Marland Kitchen atorvastatin (LIPITOR) 20 MG tablet Take 1 tablet (20 mg total) by mouth daily.  . Blood Glucose Monitoring Suppl (TRUE METRIX METER) w/Device KIT Use as directed  . fluticasone (FLONASE) 50 MCG/ACT nasal spray Place 1 spray into both nostrils daily.  . furosemide (LASIX) 40 MG tablet Take 1 tablet (40 mg total) by mouth daily as needed.  Marland Kitchen glucose blood (TRUE METRIX BLOOD GLUCOSE TEST) test strip Check blood sugar fasting and before meals and again if pt feels bad (symptoms of hypo).  Marland Kitchen levothyroxine (SYNTHROID) 100 MCG tablet TAKE 1/2 TABLET BY MOUTH DAILY BEFORE BREAKFAST  . loratadine (CLARITIN) 10 MG tablet Take 1 tablet (10 mg total) by mouth daily as needed for allergies.  . metFORMIN (GLUCOPHAGE) 500 MG tablet Take 1 tablet (500 mg total) by mouth daily with breakfast. E11.9 Diabetes type 2 controlled  . methocarbamol (ROBAXIN) 500 MG tablet Take 2 tablets (1,000 mg total) by mouth every 8 (eight) hours as needed for muscle spasms.  . metoprolol tartrate (LOPRESSOR) 50 MG tablet  Take 1 tablet (50 mg total) by mouth 2 (two) times daily.  Marland Kitchen PAZEO 0.7 % SOLN as needed.   Marland Kitchen PRADAXA 150 MG CAPS capsule TAKE 1 CAPSULE (150 MG TOTAL) BY MOUTH 2 (TWO) TIMES DAILY.  Marland Kitchen TRUEPLUS LANCETS 28G MISC Check blood sugar fasting and before meals and again if pt feels bad (symptoms of hypo).  . [DISCONTINUED] furosemide (LASIX) 40 MG tablet Take 1 tablet (40 mg total) by mouth daily as needed.  . [DISCONTINUED] lisinopril (ZESTRIL) 20 MG tablet Take 1 tablet (20 mg total) by mouth daily.     Allergies:   Patient has no known allergies.   Social  History   Socioeconomic History  . Marital status: Single    Spouse name: Not on file  . Number of children: 3  . Years of education: Not on file  . Highest education level: Not on file  Occupational History  . Occupation: unemployed  Tobacco Use  . Smoking status: Former Research scientist (life sciences)  . Smokeless tobacco: Never Used  Substance and Sexual Activity  . Alcohol use: Yes    Comment: rarely  . Drug use: Never  . Sexual activity: Yes    Birth control/protection: None  Other Topics Concern  . Not on file  Social History Narrative  . Not on file   Social Determinants of Health   Financial Resource Strain:   . Difficulty of Paying Living Expenses:   Food Insecurity:   . Worried About Charity fundraiser in the Last Year:   . Arboriculturist in the Last Year:   Transportation Needs:   . Film/video editor (Medical):   Marland Kitchen Lack of Transportation (Non-Medical):   Physical Activity:   . Days of Exercise per Week:   . Minutes of Exercise per Session:   Stress:   . Feeling of Stress :   Social Connections:   . Frequency of Communication with Friends and Family:   . Frequency of Social Gatherings with Friends and Family:   . Attends Religious Services:   . Active Member of Clubs or Organizations:   . Attends Archivist Meetings:   Marland Kitchen Marital Status:      Family History: The patient's family history includes Dementia in her mother; Diabetes in her father, maternal grandmother, mother, and paternal grandmother; Heart disease in her paternal grandmother; Hypertension in her father and mother.  ROS:   Please see the history of present illness.     All other systems reviewed and are negative.  EKGs/Labs/Other Studies Reviewed:    The following studies were reviewed today:   EKG:  EKG is  ordered today.  The ekg ordered today demonstrates normal sinus rhythm, low voltage QRS   Recent Labs: 03/23/2019: ALT 26 07/25/2019: BNP 4.6; BUN 12; Creatinine, Ser 0.76; Hemoglobin  13.4; Platelets 324; Potassium 4.7; Sodium 138; TSH 2.330  Recent Lipid Panel    Component Value Date/Time   CHOL 134 03/23/2019 1643   TRIG 72 03/23/2019 1643   HDL 43 03/23/2019 1643   CHOLHDL 3.1 03/23/2019 1643   LDLCALC 76 03/23/2019 1643    Physical Exam:    VS:  BP (!) 150/90 (BP Location: Left Arm, Patient Position: Sitting, Cuff Size: Large) Comment: Has not taken meds yet today  Pulse 78   Ht 5' 7" (1.702 m)   Wt 258 lb 8 oz (117.3 kg)   SpO2 96%   BMI 40.49 kg/m     Wt Readings from Last 3  Encounters:  09/04/19 258 lb 8 oz (117.3 kg)  08/14/19 256 lb (116.1 kg)  03/23/19 254 lb 12.8 oz (115.6 kg)     GEN:  Well nourished, well developed in no acute distress HEENT: Normal NECK: No JVD; No carotid bruits LYMPHATICS: No lymphadenopathy CARDIAC: RRR, no murmurs, rubs, gallops RESPIRATORY:  Clear to auscultation without rales, wheezing or rhonchi  ABDOMEN: Soft, non-tender, non-distended, obese MUSCULOSKELETAL: no edema; No deformity  SKIN: Warm and dry NEUROLOGIC:  Alert and oriented x 3 PSYCHIATRIC:  Normal affect   ASSESSMENT:    1. Edema leg   2. PAF (paroxysmal atrial fibrillation) ([place])   3. Essential hypertension   4. Chronic congestive heart failure, unspecified heart failure type ([place])    PLAN:    In order of problems listed above;  1. Lower extremity edema appears resolved after stopping amlodipine.  Patient advised to use Lasix as needed  Advised patient to follow-up with PCP regarding leg pain/burning sensation.  This could be neuropathy secondary to diabetes. 2. History of paroxysmal A. fib, currently in sinus rhythm.  Continue Lopressor and Pradaxa. 3. History of hypertension.  Blood pressure elevated.  Increase lisinopril to 20 mg twice daily.  Continue Lopressor 50 twice daily.  Stop amlodipine due to edema as above. 4. Prior hx of chf, last echo showed preserved ef with grade 1 diastolic dysfunction. Ok for lasix as needed. Patient's obesity  likely contributing to diastolic dysfunction.    Follow-up in 4 weeks.   Medication Adjustments/Labs and Tests Ordered: Current medicines are reviewed at length with the patient today.  Concerns regarding medicines are outlined above.  Orders Placed This Encounter  Procedures  . EKG 12-Lead   Meds ordered this encounter  Medications  . furosemide (LASIX) 40 MG tablet    Sig: Take 1 tablet (40 mg total) by mouth daily as needed.    Dispense:  30 tablet    Refill:  4  . lisinopril (ZESTRIL) 20 MG tablet    Sig: Take 1 tablet (20 mg total) by mouth in the morning and at bedtime.    Dispense:  60 tablet    Refill:  3    Patient Instructions  Medication Instructions:  Your physician has recommended you make the following change in your medication:  1- INCREASE Lisinopril to 20 mg (1 tablet) by mouth two times a day.  *If you need a refill on your cardiac medications before your next appointment, please call your pharmacy*  Follow-Up: At [facility] you and your health needs are our priority.  As part of our continuing mission to provide you with exceptional heart care, we have created designated Provider Care Teams.  These Care Teams include your primary Cardiologist (physician) and Advanced Practice Providers (APPs -  Physician Assistants and Nurse Practitioners) who all work together to provide you with the care you need, when you need it.  We recommend signing up for the patient portal called "MyChart".  Sign up information is provided on this After Visit Summary.  MyChart is used to connect with patients for Virtual Visits (Telemedicine).  Patients are able to view lab/test results, encounter notes, upcoming appointments, etc.  Non-urgent messages can be sent to your provider as well.   To learn more about what you can do with MyChart, go to [email or web address].    Your next appointment:   1 month(s)  The format for your next appointment:   In Person  Provider:     Kate Sable, MD  Signed, Kate Sable, MD  09/04/2019 9:56 AM    [place]

## 2019-09-04 NOTE — Patient Instructions (Signed)
Medication Instructions:  Your physician has recommended you make the following change in your medication:  1- INCREASE Lisinopril to 20 mg (1 tablet) by mouth two times a day.  *If you need a refill on your cardiac medications before your next appointment, please call your pharmacy*  Follow-Up: At [facility], you and your health needs are our priority.  As part of our continuing mission to provide you with exceptional heart care, we have created designated Provider Care Teams.  These Care Teams include your primary Cardiologist (physician) and Advanced Practice Providers (APPs -  Physician Assistants and Nurse Practitioners) who all work together to provide you with the care you need, when you need it.  We recommend signing up for the patient portal called "MyChart".  Sign up information is provided on this After Visit Summary.  MyChart is used to connect with patients for Virtual Visits (Telemedicine).  Patients are able to view lab/test results, encounter notes, upcoming appointments, etc.  Non-urgent messages can be sent to your provider as well.   To learn more about what you can do with MyChart, go to [email or web address].    Your next appointment:   1 month(s)  The format for your next appointment:   In Person  Provider:   Debbe Odea, MD

## 2019-09-05 ENCOUNTER — Telehealth: Payer: Self-pay | Admitting: Internal Medicine

## 2019-09-05 DIAGNOSIS — M79671 Pain in right foot: Secondary | ICD-10-CM

## 2019-09-06 ENCOUNTER — Other Ambulatory Visit: Payer: Self-pay

## 2019-09-06 NOTE — Telephone Encounter (Signed)
Contacted pt to go over Cologuard results pt is aware   Pt is requesting a referral to podiatry. Pt sates she is having b/l foot pain. Pt states she hasn't seen a podiatrist in years. Pt states the pain is mostly in her heel on the right side

## 2019-09-09 ENCOUNTER — Other Ambulatory Visit: Payer: Self-pay

## 2019-09-09 ENCOUNTER — Emergency Department
Admission: EM | Admit: 2019-09-09 | Discharge: 2019-09-09 | Disposition: A | Discharge status: Home / Self Care | Payer: Medicaid Other | Attending: Student | Admitting: Student

## 2019-09-09 ENCOUNTER — Emergency Department: Payer: Medicaid Other

## 2019-09-09 DIAGNOSIS — Y92009 Unspecified place in unspecified non-institutional (private) residence as the place of occurrence of the external cause: Secondary | ICD-10-CM | POA: Diagnosis not present

## 2019-09-09 DIAGNOSIS — I480 Paroxysmal atrial fibrillation: Secondary | ICD-10-CM | POA: Insufficient documentation

## 2019-09-09 DIAGNOSIS — I509 Heart failure, unspecified: Secondary | ICD-10-CM | POA: Insufficient documentation

## 2019-09-09 DIAGNOSIS — E119 Type 2 diabetes mellitus without complications: Secondary | ICD-10-CM | POA: Insufficient documentation

## 2019-09-09 DIAGNOSIS — I10 Essential (primary) hypertension: Secondary | ICD-10-CM | POA: Diagnosis not present

## 2019-09-09 DIAGNOSIS — Z79899 Other long term (current) drug therapy: Secondary | ICD-10-CM | POA: Insufficient documentation

## 2019-09-09 DIAGNOSIS — E039 Hypothyroidism, unspecified: Secondary | ICD-10-CM | POA: Diagnosis not present

## 2019-09-09 DIAGNOSIS — R519 Headache, unspecified: Secondary | ICD-10-CM | POA: Diagnosis not present

## 2019-09-09 DIAGNOSIS — Z87891 Personal history of nicotine dependence: Secondary | ICD-10-CM | POA: Insufficient documentation

## 2019-09-09 DIAGNOSIS — I110 Hypertensive heart disease with heart failure: Secondary | ICD-10-CM | POA: Insufficient documentation

## 2019-09-09 LAB — CBC WITH DIFFERENTIAL/PLATELET
Abs Immature Granulocytes: 0.02 10*3/uL (ref 0.00–0.07)
Basophils Absolute: 0.1 10*3/uL (ref 0.0–0.1)
Basophils Relative: 1 %
Eosinophils Absolute: 0.4 10*3/uL (ref 0.0–0.5)
Eosinophils Relative: 4 %
HCT: 43.8 % (ref 36.0–46.0)
Hemoglobin: 14.3 g/dL (ref 12.0–15.0)
Immature Granulocytes: 0 %
Lymphocytes Relative: 48 %
Lymphs Abs: 4.2 10*3/uL — ABNORMAL HIGH (ref 0.7–4.0)
MCH: 26.4 pg (ref 26.0–34.0)
MCHC: 32.6 g/dL (ref 30.0–36.0)
MCV: 80.8 fL (ref 80.0–100.0)
Monocytes Absolute: 1 10*3/uL (ref 0.1–1.0)
Monocytes Relative: 12 %
Neutro Abs: 3.1 10*3/uL (ref 1.7–7.7)
Neutrophils Relative %: 35 %
Platelets: 325 10*3/uL (ref 150–400)
RBC: 5.42 MIL/uL — ABNORMAL HIGH (ref 3.87–5.11)
RDW: 15.7 % — ABNORMAL HIGH (ref 11.5–15.5)
WBC: 8.7 10*3/uL (ref 4.0–10.5)
nRBC: 0 % (ref 0.0–0.2)

## 2019-09-09 LAB — COMPREHENSIVE METABOLIC PANEL
ALT: 43 U/L (ref 0–44)
AST: 32 U/L (ref 15–41)
Albumin: 4.4 g/dL (ref 3.5–5.0)
Alkaline Phosphatase: 68 U/L (ref 38–126)
Anion gap: 7 (ref 5–15)
BUN: 13 mg/dL (ref 6–20)
CO2: 31 mmol/L (ref 22–32)
Calcium: 10 mg/dL (ref 8.9–10.3)
Chloride: 101 mmol/L (ref 98–111)
Creatinine, Ser: 0.71 mg/dL (ref 0.44–1.00)
GFR calc Af Amer: >60 mL/min (ref >60)
GFR calc non Af Amer: >60 mL/min (ref >60)
Glucose, Bld: 115 mg/dL — ABNORMAL HIGH (ref 70–99)
Potassium: 3.6 mmol/L (ref 3.5–5.1)
Sodium: 139 mmol/L (ref 135–145)
Total Bilirubin: 0.7 mg/dL (ref 0.3–1.2)
Total Protein: 8.8 g/dL — ABNORMAL HIGH (ref 6.5–8.1)

## 2019-09-09 LAB — URINALYSIS, COMPLETE (UACMP) WITH MICROSCOPIC
Bilirubin Urine: NEGATIVE
Glucose, UA: NEGATIVE mg/dL
Hgb urine dipstick: NEGATIVE
Ketones, ur: NEGATIVE mg/dL
Nitrite: NEGATIVE
Protein, ur: 30 mg/dL — AB
Specific Gravity, Urine: 1.01 (ref 1.005–1.030)
pH: 8 (ref 5.0–8.0)

## 2019-09-09 LAB — PROTIME-INR
INR: 1.1 (ref 0.8–1.2)
Prothrombin Time: 14.2 seconds (ref 11.4–15.2)

## 2019-09-09 LAB — TROPONIN I (HIGH SENSITIVITY)
Troponin I (High Sensitivity): 3 ng/L (ref <18)
Troponin I (High Sensitivity): 3 ng/L (ref <18)

## 2019-09-09 MED ORDER — HYDRALAZINE HCL 50 MG PO TABS
50.0000 mg | ORAL_TABLET | Freq: Three times a day (TID) | ORAL | 0 refills | Status: DC
Start: 2019-09-09 — End: 2019-09-09

## 2019-09-09 MED ORDER — ACETAMINOPHEN 500 MG PO TABS
1000.0000 mg | ORAL_TABLET | Freq: Once | ORAL | Status: AC
  Administered 2019-09-09: 1000 mg via ORAL
  Filled 2019-09-09: qty 2

## 2019-09-09 MED ORDER — HYDRALAZINE HCL 25 MG PO TABS
50.0000 mg | ORAL_TABLET | Freq: Three times a day (TID) | ORAL | 0 refills | Status: DC
Start: 2019-09-09 — End: 2019-09-09

## 2019-09-09 MED ORDER — HYDRALAZINE HCL 50 MG PO TABS
50.0000 mg | ORAL_TABLET | Freq: Once | ORAL | Status: AC
  Administered 2019-09-09: 50 mg via ORAL
  Filled 2019-09-09: qty 1

## 2019-09-09 MED ORDER — HYDRALAZINE HCL 20 MG/ML IJ SOLN
10.0000 mg | Freq: Once | INTRAMUSCULAR | Status: AC
  Administered 2019-09-09: 10 mg via INTRAVENOUS
  Filled 2019-09-09: qty 1

## 2019-09-09 MED ORDER — HYDRALAZINE HCL 50 MG PO TABS
50.0000 mg | ORAL_TABLET | Freq: Three times a day (TID) | ORAL | 0 refills | Status: DC
Start: 2019-09-09 — End: 2019-10-11

## 2019-09-09 MED ORDER — KETOROLAC TROMETHAMINE 30 MG/ML IJ SOLN
15.0000 mg | Freq: Once | INTRAMUSCULAR | Status: AC
  Administered 2019-09-09: 15 mg via INTRAVENOUS
  Filled 2019-09-09: qty 1

## 2019-09-09 NOTE — ED Provider Notes (Addendum)
[facility] Emergency Department Provider Note  ____________________________________________   First MD Initiated Contact with Patient 09/09/19 [phone]     (approximate)  I have reviewed the triage vital signs and the nursing notes.  History  Chief Complaint Hypertension    HPI Jennifer Miller is a 54 y.o. female past medical history consistent with HTN, paroxysmal atrial fibrillation, DM, diastolic dysfunction who presents to the emergency department for elevated blood pressure as well as headache.  Patient states several weeks ago her cardiologist took her off her amlodipine due to side effects of leg swelling.  Since then her blood pressure has been running high.  She had a follow-up visit on 5/4, and in the clinic her blood pressure was noted to be 150/90.  She states she was counseled to check her blood pressure at home.  Over the last several days she has noticed it continued to be elevated despite compliance with her medications (including an increase of her lisinopril from 10 to 20 mg).  She reports seeing systolics as high as 960A.  Typically when she is well controlled her systolic is in the 540J.  This morning she woke from sleep with a headache.  Describes it as if a vice is wrapped on her head, feels like her head is going to explode.  Currently 8/10 in severity.  No radiation.  No alleviating/aggravating components.  No inciting event.  Reports some mild associated blurred vision.  No speech difficulties, no lateralizing or focal weakness, no numbness or tingling.  She denies any chest pain or shortness of breath.  No abdominal pain.  No vomiting or diarrhea.  Took all of her evening medications last night.   Past Medical Hx Past Medical History:  Diagnosis Date  . Congestive heart failure (CHF) ([place])   . Diabetes mellitus without complication ([place])   . HLD (hyperlipidemia)   . Hypertension   . Paroxysmal A-fib [facility])     Problem List Patient Active  Problem List   Diagnosis Date Noted  . Type 2 diabetes mellitus with obesity ([place]) 07/24/2019  . Generalized anxiety disorder 08/28/2018  . Controlled type 2 diabetes mellitus without complication, without long-term current use of insulin ([place]) 12/30/2017  . Essential hypertension 12/30/2017  . Iron deficiency anemia 12/30/2017  . PAF (paroxysmal atrial fibrillation) ([place]) 12/30/2017  . Acquired hypothyroidism 12/30/2017  . History of colon polyps 12/30/2017  . Hyperlipidemia 12/30/2017  . Congestive heart failure ([place]) 12/30/2017    Past Surgical Hx Past Surgical History:  Procedure Laterality Date  . DILATION AND CURETTAGE OF UTERUS    . KNEE ARTHROSCOPY AND ARTHROTOMY      Medications Prior to Admission medications   Medication Sig Start Date End Date Taking? Authorizing Provider  albuterol (PROVENTIL HFA;VENTOLIN HFA) 108 (90 Base) MCG/ACT inhaler Inhale 1-2 puffs into the lungs every 6 (six) hours as needed for wheezing or shortness of breath. 08/15/18   Ladell Pier, MD  atorvastatin (LIPITOR) 20 MG tablet Take 1 tablet (20 mg total) by mouth daily. 12/29/17   Ladell Pier, MD  Blood Glucose Monitoring Suppl (TRUE METRIX METER) w/Device KIT Use as directed 05/12/18   Ladell Pier, MD  fluticasone [facility]) 50 MCG/ACT nasal spray Place 1 spray into both nostrils daily. 03/23/19   Ladell Pier, MD  furosemide (LASIX) 40 MG tablet Take 1 tablet (40 mg total) by mouth daily as needed. 09/04/19   Kate Sable, MD  glucose blood (TRUE METRIX BLOOD GLUCOSE TEST)  test strip Check blood sugar fasting and before meals and again if pt feels bad (symptoms of hypo). 05/12/18   Ladell Pier, MD  levothyroxine (SYNTHROID) 100 MCG tablet TAKE 1/2 TABLET BY MOUTH DAILY BEFORE BREAKFAST 03/23/19   Ladell Pier, MD  lisinopril (ZESTRIL) 20 MG tablet Take 1 tablet (20 mg total) by mouth in the morning and at bedtime. 09/04/19 12/03/19  Kate Sable, MD    loratadine (CLARITIN) 10 MG tablet Take 1 tablet (10 mg total) by mouth daily as needed for allergies. 03/23/19   Ladell Pier, MD  metFORMIN (GLUCOPHAGE) 500 MG tablet Take 1 tablet (500 mg total) by mouth daily with breakfast. E11.9 Diabetes type 2 controlled 03/30/19   Ladell Pier, MD  methocarbamol (ROBAXIN) 500 MG tablet Take 2 tablets (1,000 mg total) by mouth every 8 (eight) hours as needed for muscle spasms. 02/01/19   Argentina Donovan, PA-C  metoprolol tartrate (LOPRESSOR) 50 MG tablet Take 1 tablet (50 mg total) by mouth 2 (two) times daily. 03/23/19   Ladell Pier, MD  PAZEO 0.7 % SOLN as needed.  12/15/18   [provider]  PRADAXA 150 MG CAPS capsule TAKE 1 CAPSULE (150 MG TOTAL) BY MOUTH 2 (TWO) TIMES DAILY. 05/28/19   Ladell Pier, MD  TRUEPLUS LANCETS 28G MISC Check blood sugar fasting and before meals and again if pt feels bad (symptoms of hypo). 05/12/18   Ladell Pier, MD    Allergies Patient has no known allergies.  Family Hx Family History  Problem Relation Age of Onset  . Diabetes Mother   . Hypertension Mother   . Dementia Mother   . Diabetes Father   . Hypertension Father   . Diabetes Maternal Grandmother   . Diabetes Paternal Grandmother   . Heart disease Paternal Grandmother     Social Hx Social History   Tobacco Use  . Smoking status: Former Research scientist (life sciences)  . Smokeless tobacco: Never Used  Substance Use Topics  . Alcohol use: Yes    Comment: rarely  . Drug use: Never     Review of Systems  Constitutional: Negative for fever. Negative for chills. + elevated blood pressure Eyes: Negative for visual changes. ENT: Negative for sore throat. Cardiovascular: Negative for chest pain. Respiratory: Negative for shortness of breath. Gastrointestinal: Negative for nausea. Negative for vomiting.  Genitourinary: Negative for dysuria. Musculoskeletal: Negative for leg swelling. Skin: Negative for rash. Neurological: + for  headaches.   Physical Exam  Vital Signs: ED Triage Vitals  Enc Vitals Group     BP 09/09/19 0222 (!) 263/144     Pulse Rate 09/09/19 0222 79     Resp 09/09/19 0222 (!) 22     Temp --      Temp src --      SpO2 09/09/19 0222 96 %     Weight 09/09/19 0223 260 lb (117.9 kg)     Height 09/09/19 0223 '5\' 7"'  (1.702 m)     Head Circumference --      Peak Flow --      Pain Score 09/09/19 0223 8     Pain Loc --      Pain Edu? --      Excl. in [place]? --     Constitutional: Alert and oriented. Well appearing. NAD. Markedly hypertensive on arrival.  Head: Normocephalic. Atraumatic. Eyes: Conjunctivae clear. Sclera anicteric. Pupils equal and symmetric. Nose: No masses or lesions. No congestion or rhinorrhea. Mouth/Throat: Wearing mask.  Neck: No stridor. Trachea midline.  Cardiovascular: Normal rate, regular rhythm. Extremities well perfused. Respiratory: Normal respiratory effort.  Lungs CTAB. Gastrointestinal: Soft. Non-distended. Non-tender.  Genitourinary: Deferred. Musculoskeletal: No lower extremity edema. No deformities. Neurologic:  Normal speech and language. No gross focal or lateralizing neurologic deficits are appreciated. Alert and oriented.  Face symmetric.  Tongue midline.  Cranial nerves II through XII intact. UE and LE strength 5/5 and symmetric. UE and LE SILT.  Skin: Skin is warm, dry and intact. No rash noted. Psychiatric: Mood and affect are appropriate for situation.  EKG  Personally reviewed and interpreted by myself.   Date: 09/09/19 Time: 0232 Rate: 73 Rhythm: sinus Axis: normal Intervals: normal QRS, normal QTc No acute ischemic changes No STEMI    Radiology  Personally reviewed available imaging myself.   CT head - IMPRESSION:  1. No acute intracranial process.    Procedures  Procedure(s) performed (including critical care):  .Critical Care Performed by: Lilia Pro., MD Authorized by: Lilia Pro., MD   Critical care provider  statement:    Critical care time (minutes):  35   Critical care was necessary to treat or prevent imminent or life-threatening deterioration of the following conditions: HTN requiring IV antihypertensives and repeat evaluation.   Critical care was time spent personally by me on the following activities:  Evaluation of patient's response to treatment, examination of patient, ordering and performing treatments and interventions, ordering and review of laboratory studies, ordering and review of radiographic studies, pulse oximetry, re-evaluation of patient's condition, obtaining history from patient or surrogate and review of old charts     Initial Impression / Assessment and Plan / MDM / ED Course  54 y.o. female who presents to the ED for headache and elevated blood pressure  Ddx: HTN emergency, hypertensive bleed, uncontrolled BP due to recent medication changes, elevated BP 2/2 pain, tension headache.   Notably HTN on arrival. Will plan for labs, imaging, IV antihypertensives and reassess. In setting of her hx of HF will trial IV hydralazine first.  Clinical Course as of Sep 08 957  Sun Sep 09, 2019  0601 Patient's work-up largely unremarkable.  Normal creatinine, no blood in the urine, no evidence of endorgan damage.  CT head negative.  Blood pressure improved with an IV dose of hydralazine and then converted to oral.  Patient also reports improvement in her headache.  Blood pressure down trended appropriately closer to her normal 350K to 938 systolic, 18E diastolic.  As such, feel patient is stable for discharge with outpatient follow-up.  Discussed starting her on hydralazine, she is in agreement with this.  Advised to touch base with her PCP/cardiologist regarding this new medication to ensure they agree with this plan as well.  She voices understanding of this.  Given return precautions.   [SM]    Clinical Course User Index [SM] Lilia Pro., MD      _______________________________   As part of my medical decision making I have reviewed available labs, radiology tests, reviewed old records/performed chart review.    Final Clinical Impression(s) / ED Diagnosis  Final diagnoses:  Hypertension, unspecified type  Complaint of headache       Note:  This document was prepared using Dragon voice recognition software and may include unintentional dictation errors.   Lilia Pro., MD 09/09/19 1002    Lilia Pro., MD 09/25/19 [phone]

## 2019-09-09 NOTE — ED Notes (Signed)
Pt transported to CT ?

## 2019-09-09 NOTE — ED Triage Notes (Signed)
Pt states htn at home. Pt states has a headache. Pt states recently had a change in htn medications from cardiology with poor control of htn. Pt taken to room 19 after blood pressure checked in left arm with 263/144 while sitting, pt with blood pressure in right arm while lying of 193/112.

## 2019-09-09 NOTE — Discharge Instructions (Signed)
Thank you for letting us take care of you in the emergency department today.   Please continue to take any regular, prescribed medications.   New medications we have prescribed:  Hydralazine, for blood pressure  Please follow up with: Your primary care doctor and/or your cardiology doctor to discuss starting this new blood pressure medication and review your ER visit and follow up on your symptoms.   Please return to the ER for any new or worsening symptoms.

## 2019-09-10 ENCOUNTER — Telehealth: Payer: Self-pay | Admitting: Cardiology

## 2019-09-10 ENCOUNTER — Encounter: Payer: Self-pay | Admitting: *Deleted

## 2019-09-10 ENCOUNTER — Encounter: Payer: Self-pay | Admitting: Cardiology

## 2019-09-10 ENCOUNTER — Ambulatory Visit (INDEPENDENT_AMBULATORY_CARE_PROVIDER_SITE_OTHER): Payer: Medicaid Other | Admitting: Cardiology

## 2019-09-10 VITALS — BP 152/90 | HR 93 | Ht 67.0 in | Wt 254.0 lb

## 2019-09-10 DIAGNOSIS — I10 Essential (primary) hypertension: Secondary | ICD-10-CM

## 2019-09-10 DIAGNOSIS — I480 Paroxysmal atrial fibrillation: Secondary | ICD-10-CM

## 2019-09-10 MED ORDER — LISINOPRIL 40 MG PO TABS: 40.0000 mg | ORAL_TABLET | Freq: Every day | ORAL | 90 refills | Status: DC

## 2019-09-10 MED ORDER — CARVEDILOL 25 MG PO TABS
25.0000 mg | ORAL_TABLET | Freq: Two times a day (BID) | ORAL | 5 refills | Status: DC
Start: 2019-09-10 — End: 2019-11-09

## 2019-09-10 NOTE — Progress Notes (Signed)
Cardiology Office Note:    Date:  09/10/2019   ID:  Jennifer Miller, DOB 03-13-1966, MRN [id number]  PCP:  Ladell Pier, MD  Cardiologist:  Kate Sable, MD  Electrophysiologist:  None   Referring MD: Ladell Pier, MD   Chief Complaint  Patient presents with  . Other    ED follow up - Elevated BP . meds reviewed verbally with patient.     History of Present Illness:    Jennifer Miller is a 54 y.o. female with a hx of hypertension, paroxysmal atrial fibrillation on Pradaxa, diabetes,who presents due to elevated blood pressures.  After last visit, amlodipine was stopped due to edema.  Lisinopril was increased to 20 mg d twice daily.  She noticed worsening blood pressures at home with systolic up to 782.  She presented to the ED with headaches and elevated blood pressures.  Head CT was unrevealing, creatinine was normal.  She was given IV hydralazine and started on p.o. hydralazine 50 mg 3 times daily with close follow-up recommended.  Patient endorses eating salty seafood about twice prior to ED visit.  She has a history of going to the emergency room due to elevated blood pressures after eating hot dogs.  Historical notes Patient was diagnosed with congestive heart failure about 19 years ago after having a baby.  She was fine before.  She recently moved to the area from [place].  She was placed on medications including Metroprolol tartrate, lisinopril, Lasix 40 mg every day.  She denies edema, orthopnea.  About 6 years ago, she states eating 3 hot dogs and drinking some soda while at her mother's home.  She later on noticed her heart was beating fast and irregular.  This prompted her to go to the ED.  She was evaluated and told she was in atrial fibrillation.  She was started on anticoagulation which she has taken since, currently takes Pradaxa twice daily.  She is a former smoker, denies any history of MI.   Past Medical History:  Diagnosis Date  . Congestive heart failure  (CHF) ([place])   . Diabetes mellitus without complication ([place])   . HLD (hyperlipidemia)   . Hypertension   . Paroxysmal A-fib [facility])     Past Surgical History:  Procedure Laterality Date  . DILATION AND CURETTAGE OF UTERUS    . KNEE ARTHROSCOPY AND ARTHROTOMY      Current Medications: Current Meds  Medication Sig  . albuterol (PROVENTIL HFA;VENTOLIN HFA) 108 (90 Base) MCG/ACT inhaler Inhale 1-2 puffs into the lungs every 6 (six) hours as needed for wheezing or shortness of breath.  Marland Kitchen atorvastatin (LIPITOR) 20 MG tablet Take 1 tablet (20 mg total) by mouth daily.  . Blood Glucose Monitoring Suppl (TRUE METRIX METER) w/Device KIT Use as directed  . fluticasone (FLONASE) 50 MCG/ACT nasal spray Place 1 spray into both nostrils daily.  . furosemide (LASIX) 40 MG tablet Take 1 tablet (40 mg total) by mouth daily as needed.  Marland Kitchen glucose blood (TRUE METRIX BLOOD GLUCOSE TEST) test strip Check blood sugar fasting and before meals and again if pt feels bad (symptoms of hypo).  . hydrALAZINE (APRESOLINE) 50 MG tablet Take 1 tablet (50 mg total) by mouth 3 (three) times daily.  Marland Kitchen levothyroxine (SYNTHROID) 100 MCG tablet TAKE 1/2 TABLET BY MOUTH DAILY BEFORE BREAKFAST (Patient taking differently: Take 50 mcg by mouth daily before breakfast. TAKE 1/2 TABLET BY MOUTH DAILY BEFORE BREAKFAST)  . lisinopril (ZESTRIL) 40 MG tablet Take 1  tablet (40 mg total) by mouth daily.  Marland Kitchen loratadine (CLARITIN) 10 MG tablet Take 1 tablet (10 mg total) by mouth daily as needed for allergies.  . metFORMIN (GLUCOPHAGE) 500 MG tablet Take 1 tablet (500 mg total) by mouth daily with breakfast. E11.9 Diabetes type 2 controlled  . methocarbamol (ROBAXIN) 500 MG tablet Take 2 tablets (1,000 mg total) by mouth every 8 (eight) hours as needed for muscle spasms.  . Multiple Vitamins-Minerals (MULTIVITAMIN WITH MINERALS) tablet Take 1 tablet by mouth daily.  Marland Kitchen PAZEO 0.7 % SOLN as needed.   Marland Kitchen PRADAXA 150 MG CAPS capsule TAKE 1 CAPSULE  (150 MG TOTAL) BY MOUTH 2 (TWO) TIMES DAILY.  Marland Kitchen TRUEPLUS LANCETS 28G MISC Check blood sugar fasting and before meals and again if pt feels bad (symptoms of hypo).  . [DISCONTINUED] lisinopril (ZESTRIL) 20 MG tablet Take 1 tablet (20 mg total) by mouth in the morning and at bedtime.  . [DISCONTINUED] metoprolol tartrate (LOPRESSOR) 50 MG tablet Take 1 tablet (50 mg total) by mouth 2 (two) times daily.     Allergies:   Patient has no known allergies.   Social History   Socioeconomic History  . Marital status: Single    Spouse name: Not on file  . Number of children: 3  . Years of education: Not on file  . Highest education level: Not on file  Occupational History  . Occupation: unemployed  Tobacco Use  . Smoking status: Former Research scientist (life sciences)  . Smokeless tobacco: Never Used  Substance and Sexual Activity  . Alcohol use: Yes    Comment: rarely  . Drug use: Never  . Sexual activity: Yes    Birth control/protection: None  Other Topics Concern  . Not on file  Social History Narrative  . Not on file   Social Determinants of Health   Financial Resource Strain:   . Difficulty of Paying Living Expenses:   Food Insecurity:   . Worried About Charity fundraiser in the Last Year:   . Arboriculturist in the Last Year:   Transportation Needs:   . Film/video editor (Medical):   Marland Kitchen Lack of Transportation (Non-Medical):   Physical Activity:   . Days of Exercise per Week:   . Minutes of Exercise per Session:   Stress:   . Feeling of Stress :   Social Connections:   . Frequency of Communication with Friends and Family:   . Frequency of Social Gatherings with Friends and Family:   . Attends Religious Services:   . Active Member of Clubs or Organizations:   . Attends Archivist Meetings:   Marland Kitchen Marital Status:      Family History: The patient's family history includes Dementia in her mother; Diabetes in her father, maternal grandmother, mother, and paternal grandmother; Heart  disease in her paternal grandmother; Hypertension in her father and mother.  ROS:   Please see the history of present illness.     All other systems reviewed and are negative.  EKGs/Labs/Other Studies Reviewed:    The following studies were reviewed today:   EKG:  EKG is  ordered today.  The ekg ordered today demonstrates normal sinus rhythm,    Recent Labs: 07/25/2019: BNP 4.6; TSH 2.330 09/09/2019: ALT 43; BUN 13; Creatinine, Ser 0.71; Hemoglobin 14.3; Platelets 325; Potassium 3.6; Sodium 139  Recent Lipid Panel    Component Value Date/Time   CHOL 134 03/23/2019 1643   TRIG 72 03/23/2019 1643   HDL 43  03/23/2019 1643   CHOLHDL 3.1 03/23/2019 1643   LDLCALC 76 03/23/2019 1643    Physical Exam:    VS:  BP (!) 152/90 (BP Location: Left Arm, Patient Position: Sitting, Cuff Size: Large)   Pulse 93   Ht '5\' 7"'  (1.702 m)   Wt 254 lb (115.2 kg)   SpO2 98%   BMI 39.78 kg/m     Wt Readings from Last 3 Encounters:  09/10/19 254 lb (115.2 kg)  09/09/19 260 lb (117.9 kg)  09/04/19 258 lb 8 oz (117.3 kg)     GEN:  Well nourished, well developed in no acute distress HEENT: Normal NECK: No JVD; No carotid bruits LYMPHATICS: No lymphadenopathy CARDIAC: RRR, no murmurs, rubs, gallops RESPIRATORY:  Clear to auscultation without rales, wheezing or rhonchi  ABDOMEN: Soft, non-tender, non-distended, obese MUSCULOSKELETAL: no edema; No deformity  SKIN: Warm and dry NEUROLOGIC:  Alert and oriented x 3 PSYCHIATRIC:  Normal affect   ASSESSMENT:    1. Essential hypertension   2. PAF (paroxysmal atrial fibrillation) (HCC)    PLAN:    In order of problems listed above;  1. Patient with history of uncontrolled hypertension.  Blood pressure still elevated.  Stop metoprolol, start Coreg 25 mg twice daily, lisinopril 40 mg daily, hydralazine 50 mg 3 times daily.  And advised to check blood pressures frequently at home and keep a log.  Low-salt diet advised.  We will consider starting  Aldactone if blood pressure still elevated on follow-up visit. 2. History of paroxysmal A. fib, currently in sinus rhythm.  Start Coreg 25 mg, continue Pradaxa.  Follow-up in 2 weeks.   Medication Adjustments/Labs and Tests Ordered: Current medicines are reviewed at length with the patient today.  Concerns regarding medicines are outlined above.  Orders Placed This Encounter  Procedures  . EKG 12-Lead   Meds ordered this encounter  Medications  . lisinopril (ZESTRIL) 40 MG tablet    Sig: Take 1 tablet (40 mg total) by mouth daily.    Dispense:  30 tablet    Refill:  90  . carvedilol (COREG) 25 MG tablet    Sig: Take 1 tablet (25 mg total) by mouth 2 (two) times daily.    Dispense:  60 tablet    Refill:  5    Patient Instructions  Medication Instructions:  Your physician has recommended you make the following change in your medication:  1- STOP Metoprolol. 2- CHANGE Lisinopril to 40 mg by mouth once a day. 3- START Carvedilol 25 mg by mouth two times a day. *If you need a refill on your cardiac medications before your next appointment, please call your pharmacy*   Follow-Up: At [facility], you and your health needs are our priority.  As part of our continuing mission to provide you with exceptional heart care, we have created designated Provider Care Teams.  These Care Teams include your primary Cardiologist (physician) and Advanced Practice Providers (APPs -  Physician Assistants and Nurse Practitioners) who all work together to provide you with the care you need, when you need it.  We recommend signing up for the patient portal called "MyChart".  Sign up information is provided on this After Visit Summary.  MyChart is used to connect with patients for Virtual Visits (Telemedicine).  Patients are able to view lab/test results, encounter notes, upcoming appointments, etc.  Non-urgent messages can be sent to your provider as well.   To learn more about what you can do with  MyChart, go to  [email or web address].    Your next appointment:   2 week(s)  The format for your next appointment:   In Person  Provider:   Kate Sable, MD     Signed, Kate Sable, MD  09/10/2019 2:39 PM    [place]

## 2019-09-10 NOTE — Telephone Encounter (Signed)
Patient seen in ED for htn .  Scheduled same day .

## 2019-09-10 NOTE — Patient Instructions (Addendum)
Medication Instructions:  Your physician has recommended you make the following change in your medication:  1- STOP Metoprolol. 2- CHANGE Lisinopril to 40 mg by mouth once a day. 3- START Carvedilol 25 mg by mouth two times a day. *If you need a refill on your cardiac medications before your next appointment, please call your pharmacy*   Follow-Up: At [facility], you and your health needs are our priority.  As part of our continuing mission to provide you with exceptional heart care, we have created designated Provider Care Teams.  These Care Teams include your primary Cardiologist (physician) and Advanced Practice Providers (APPs -  Physician Assistants and Nurse Practitioners) who all work together to provide you with the care you need, when you need it.  We recommend signing up for the patient portal called "MyChart".  Sign up information is provided on this After Visit Summary.  MyChart is used to connect with patients for Virtual Visits (Telemedicine).  Patients are able to view lab/test results, encounter notes, upcoming appointments, etc.  Non-urgent messages can be sent to your provider as well.   To learn more about what you can do with MyChart, go to [email or web address].    Your next appointment:   2 week(s)  The format for your next appointment:   In Person  Provider:   Debbe Odea, MD

## 2019-09-10 NOTE — Telephone Encounter (Signed)
Noted. Secure chat sent to Dr. Azucena Cecil and Blenda Bridegroom, RN that the patient has been added on to Dr. Merita Norton schedule today.

## 2019-09-11 ENCOUNTER — Telehealth: Payer: Self-pay | Admitting: Cardiology

## 2019-09-11 ENCOUNTER — Other Ambulatory Visit: Payer: Self-pay | Admitting: Internal Medicine

## 2019-09-11 DIAGNOSIS — I480 Paroxysmal atrial fibrillation: Secondary | ICD-10-CM

## 2019-09-11 NOTE — Telephone Encounter (Signed)
Patient calling  Patient was in office yesterday and started medication for BP BP is very low today 86/62 Please call to discuss

## 2019-09-11 NOTE — Telephone Encounter (Addendum)
Spoke with the patient. Patient did not take any of her medications until early this afternoon. Patient reports taking: Lisinopril 40mg  Lasix 40 mg Carvedilol 25 mg She did not take Hydralazine at all today.  Patient sts that with in the hour of taking her medications she began to feel dizzy and lightheaded which prompted her to ck her BP. Pt BP was 86/62 no HR provided. Patient sts that she was on the line with her pharmacy at the time and was instructed by the pharmacist to liberalize her fluids today.  Patient sts that she feels better. I asked her to recheck her BP while I held the line. Pt reports BP 120/75 88bpm. Advised the patient to continue with her afternoon medications as prescribed.  Patient sts that her BP fluctuates through out the day.Advised the patient to monitor her BP/HR 3 times daily morning, mid day, and evening. Adv her to record her readings and bring them with her to her next appt. Adv her to contact the office sooner if her BP is consistently low or high.  Advised the patient that I will fwd the message to Dr. and we will call back if he has additional recommendations. Patient agreeable with the plan and voiced appreciation for the call back.

## 2019-09-13 ENCOUNTER — Other Ambulatory Visit: Payer: Self-pay

## 2019-09-13 ENCOUNTER — Encounter: Payer: Self-pay | Admitting: Critical Care Medicine

## 2019-09-13 ENCOUNTER — Ambulatory Visit: Payer: Medicaid Other | Attending: Critical Care Medicine | Admitting: Critical Care Medicine

## 2019-09-13 VITALS — BP 135/88 | HR 84 | Temp 97.9°F | Resp 16 | Ht 67.0 in | Wt 255.0 lb

## 2019-09-13 DIAGNOSIS — E1169 Type 2 diabetes mellitus with other specified complication: Secondary | ICD-10-CM | POA: Diagnosis not present

## 2019-09-13 DIAGNOSIS — F411 Generalized anxiety disorder: Secondary | ICD-10-CM | POA: Diagnosis not present

## 2019-09-13 DIAGNOSIS — I110 Hypertensive heart disease with heart failure: Secondary | ICD-10-CM | POA: Insufficient documentation

## 2019-09-13 DIAGNOSIS — I509 Heart failure, unspecified: Secondary | ICD-10-CM

## 2019-09-13 DIAGNOSIS — I10 Essential (primary) hypertension: Secondary | ICD-10-CM

## 2019-09-13 DIAGNOSIS — E785 Hyperlipidemia, unspecified: Secondary | ICD-10-CM | POA: Insufficient documentation

## 2019-09-13 DIAGNOSIS — Z7984 Long term (current) use of oral hypoglycemic drugs: Secondary | ICD-10-CM | POA: Diagnosis not present

## 2019-09-13 DIAGNOSIS — Z7901 Long term (current) use of anticoagulants: Secondary | ICD-10-CM | POA: Diagnosis not present

## 2019-09-13 DIAGNOSIS — E039 Hypothyroidism, unspecified: Secondary | ICD-10-CM | POA: Diagnosis not present

## 2019-09-13 DIAGNOSIS — E669 Obesity, unspecified: Secondary | ICD-10-CM | POA: Insufficient documentation

## 2019-09-13 DIAGNOSIS — I5030 Unspecified diastolic (congestive) heart failure: Secondary | ICD-10-CM | POA: Insufficient documentation

## 2019-09-13 DIAGNOSIS — I480 Paroxysmal atrial fibrillation: Secondary | ICD-10-CM | POA: Diagnosis not present

## 2019-09-13 DIAGNOSIS — Z8249 Family history of ischemic heart disease and other diseases of the circulatory system: Secondary | ICD-10-CM | POA: Insufficient documentation

## 2019-09-13 DIAGNOSIS — Z87891 Personal history of nicotine dependence: Secondary | ICD-10-CM | POA: Insufficient documentation

## 2019-09-13 DIAGNOSIS — Z79890 Hormone replacement therapy (postmenopausal): Secondary | ICD-10-CM | POA: Diagnosis not present

## 2019-09-13 DIAGNOSIS — E119 Type 2 diabetes mellitus without complications: Secondary | ICD-10-CM | POA: Diagnosis not present

## 2019-09-13 DIAGNOSIS — Z833 Family history of diabetes mellitus: Secondary | ICD-10-CM | POA: Diagnosis not present

## 2019-09-13 DIAGNOSIS — Z79899 Other long term (current) drug therapy: Secondary | ICD-10-CM | POA: Insufficient documentation

## 2019-09-13 LAB — GLUCOSE, POCT (MANUAL RESULT ENTRY): POC Glucose: 135 mg/dl — AB (ref 70–99)

## 2019-09-13 MED ORDER — TRUE METRIX BLOOD GLUCOSE TEST VI STRP: ORAL_STRIP | 12 refills | Status: DC

## 2019-09-13 MED ORDER — TRUEPLUS LANCETS 28G MISC: 12 refills | Status: DC

## 2019-09-13 NOTE — Progress Notes (Signed)
Here for Hospital F /u / went to the Cardio / medications were changed

## 2019-09-13 NOTE — Patient Instructions (Addendum)
No change in medications  Watch salt intake,  Keep well hydrated when sugars are high.  See diet below for high blood pressure  Keep follow up with cardiology.  Continue hydralazine three times daily for now  Return Dr Laural Benes one month  Information on Covid vaccine:  COVID-19 Vaccine Information can be found at: [employer] For questions related to vaccine distribution or appointments, please email vaccine@ .com or call [phone])[phone].    Hypertension, Adult High blood pressure (hypertension) is when the force of blood pumping through the arteries is too strong. The arteries are the blood vessels that carry blood from the heart throughout the body. Hypertension forces the heart to work harder to pump blood and may cause arteries to become narrow or stiff. Untreated or uncontrolled hypertension can cause a heart attack, heart failure, a stroke, kidney disease, and other problems. A blood pressure reading consists of a higher number over a lower number. Ideally, your blood pressure should be below 120/80. The first ("top") number is called the systolic pressure. It is a measure of the pressure in your arteries as your heart beats. The second ("bottom") number is called the diastolic pressure. It is a measure of the pressure in your arteries as the heart relaxes. What are the causes? The exact cause of this condition is not known. There are some conditions that result in or are related to high blood pressure. What increases the risk? Some risk factors for high blood pressure are under your control. The following factors may make you more likely to develop this condition:  Smoking.  Having type 2 diabetes mellitus, high cholesterol, or both.  Not getting enough exercise or physical activity.  Being overweight.  Having too much fat, sugar, calories, or salt (sodium) in your diet.  Drinking too much alcohol. Some  risk factors for high blood pressure may be difficult or impossible to change. Some of these factors include:  Having chronic kidney disease.  Having a family history of high blood pressure.  Age. Risk increases with age.  Race. You may be at higher risk if you are African American.  Gender. Men are at higher risk than women before age 64. After age 56, women are at higher risk than men.  Having obstructive sleep apnea.  Stress. What are the signs or symptoms? High blood pressure may not cause symptoms. Very high blood pressure (hypertensive crisis) may cause:  Headache.  Anxiety.  Shortness of breath.  Nosebleed.  Nausea and vomiting.  Vision changes.  Severe chest pain.  Seizures. How is this diagnosed? This condition is diagnosed by measuring your blood pressure while you are seated, with your arm resting on a flat surface, your legs uncrossed, and your feet flat on the floor. The cuff of the blood pressure monitor will be placed directly against the skin of your upper arm at the level of your heart. It should be measured at least twice using the same arm. Certain conditions can cause a difference in blood pressure between your right and left arms. Certain factors can cause blood pressure readings to be lower or higher than normal for a short period of time:  When your blood pressure is higher when you are in a health care provider's office than when you are at home, this is called white coat hypertension. Most people with this condition do not need medicines.  When your blood pressure is higher at home than when you are in a health care provider's office, this is called masked hypertension.  Most people with this condition may need medicines to control blood pressure. If you have a high blood pressure reading during one visit or you have normal blood pressure with other risk factors, you may be asked to:  Return on a different day to have your blood pressure checked  again.  Monitor your blood pressure at home for 1 week or longer. If you are diagnosed with hypertension, you may have other blood or imaging tests to help your health care provider understand your overall risk for other conditions. How is this treated? This condition is treated by making healthy lifestyle changes, such as eating healthy foods, exercising more, and reducing your alcohol intake. Your health care provider may prescribe medicine if lifestyle changes are not enough to get your blood pressure under control, and if:  Your systolic blood pressure is above 130.  Your diastolic blood pressure is above 80. Your personal target blood pressure may vary depending on your medical conditions, your age, and other factors. Follow these instructions at home: Eating and drinking   Eat a diet that is high in fiber and potassium, and low in sodium, added sugar, and fat. An example eating plan is called the DASH (Dietary Approaches to Stop Hypertension) diet. To eat this way: ? Eat plenty of fresh fruits and vegetables. Try to fill one half of your plate at each meal with fruits and vegetables. ? Eat whole grains, such as whole-wheat pasta, brown rice, or whole-grain bread. Fill about one fourth of your plate with whole grains. ? Eat or drink low-fat dairy products, such as skim milk or low-fat yogurt. ? Avoid fatty cuts of meat, processed or cured meats, and poultry with skin. Fill about one fourth of your plate with lean proteins, such as fish, chicken without skin, beans, eggs, or tofu. ? Avoid pre-made and processed foods. These tend to be higher in sodium, added sugar, and fat.  Reduce your daily sodium intake. Most people with hypertension should eat less than 1,500 mg of sodium a day.  Do not drink alcohol if: ? Your health care provider tells you not to drink. ? You are pregnant, may be pregnant, or are planning to become pregnant.  If you drink alcohol: ? Limit how much you use  to:  0-1 drink a day for women.  0-2 drinks a day for men. ? Be aware of how much alcohol is in your drink. In the [place] one drink equals one 12 oz bottle of beer (355 mL), one 5 oz glass of wine (148 mL), or one 1 oz glass of hard liquor (44 mL). Lifestyle   Work with your health care provider to maintain a healthy body weight or to lose weight. Ask what an ideal weight is for you.  Get at least 30 minutes of exercise most days of the week. Activities may include walking, swimming, or biking.  Include exercise to strengthen your muscles (resistance exercise), such as Pilates or lifting weights, as part of your weekly exercise routine. Try to do these types of exercises for 30 minutes at least 3 days a week.  Do not use any products that contain nicotine or tobacco, such as cigarettes, e-cigarettes, and chewing tobacco. If you need help quitting, ask your health care provider.  Monitor your blood pressure at home as told by your health care provider.  Keep all follow-up visits as told by your health care provider. This is important. Medicines  Take over-the-counter and prescription medicines only as told by  your health care provider. Follow directions carefully. Blood pressure medicines must be taken as prescribed.  Do not skip doses of blood pressure medicine. Doing this puts you at risk for problems and can make the medicine less effective.  Ask your health care provider about side effects or reactions to medicines that you should watch for. Contact a health care provider if you:  Think you are having a reaction to a medicine you are taking.  Have headaches that keep coming back (recurring).  Feel dizzy.  Have swelling in your ankles.  Have trouble with your vision. Get help right away if you:  Develop a severe headache or confusion.  Have unusual weakness or numbness.  Feel faint.  Have severe pain in your chest or abdomen.  Vomit repeatedly.  Have trouble  breathing. Summary  Hypertension is when the force of blood pumping through your arteries is too strong. If this condition is not controlled, it may put you at risk for serious complications.  Your personal target blood pressure may vary depending on your medical conditions, your age, and other factors. For most people, a normal blood pressure is less than 120/80.  Hypertension is treated with lifestyle changes, medicines, or a combination of both. Lifestyle changes include losing weight, eating a healthy, low-sodium diet, exercising more, and limiting alcohol. This information is not intended to replace advice given to you by your health care provider. Make sure you discuss any questions you have with your health care provider. Document Revised: 12/28/2017 Document Reviewed: 12/28/2017 Elsevier Patient Education  2020 [employer].

## 2019-09-13 NOTE — Telephone Encounter (Signed)
  Called to give the patient Dr. Merita Norton response and recommendation. [email or web address].    Debbe Odea, MD  You 14 hours ago (5:58 PM)   Have patient continue taking Coreg, lisinopril, Lasix as prescribed. Check blood pressures as you recommended Misty Stanley. If blood pressure is elevated she can take a dose of hydralazine. Keep follow-up appointment

## 2019-09-13 NOTE — Progress Notes (Signed)
Subjective:    Patient ID: Jennifer Miller, female    DOB: 1965/10/11, 54 y.o.   MRN: [id number]  54 y.o.F PCP Wynetta Emery.     This patient comes in today for primary care follow-up and has history of hypertension and paroxysmal atrial fibrillation obesity and history of diastolic heart failure and controlled type 2 diabetes with hypothyroidism  This patient has had difficulty with management of her blood pressure.  Blood pressures have been very labile and she had been in the 190/100 range and had her blood pressure evaluated in the emergency room with headaches CT scan of the head was unrevealing creatinine normal.  She was given IV hydralazine and started on hydralazine 50 mg 3 times daily.  She states she has had initial adverse reactions to the hydralazine.  She states there is issues with lightheadedness at times and low blood pressure readings at home.  She is actually been reducing the hydralazine to twice daily instead of 3 times daily at times.  She was on amlodipine in the past but this caused too much pedal edema.  This patient is also now on an increased dose of lisinopril at 40 mg daily and has had Coreg started and metoprolol stopped by cardiology to where she is at 25 mg twice daily of Coreg.  Note this patient was diagnosed with heart failure 19 years ago after having a child.  She just moved here from [place] and in [place] she had been on metoprolol lisinopril and furosemide.  Note the patient does have difficulty with maintaining a low-salt diet and will occasionally eat hotdogs and go to fast food restaurants.  She has been advised to watch her salt intake previously.  Note this patient is also on Pradaxa and recent ECG showed sinus rhythm.  Note she is yet to achieve a Covid vaccine at this time..  Past Medical History:  Diagnosis Date  . Congestive heart failure (CHF) ([place])   . Diabetes mellitus without complication ([place])   . HLD (hyperlipidemia)   . Hypertension   .  Paroxysmal A-fib ([facility])      Family History  Problem Relation Age of Onset  . Diabetes Mother   . Hypertension Mother   . Dementia Mother   . Diabetes Father   . Hypertension Father   . Diabetes Maternal Grandmother   . Diabetes Paternal Grandmother   . Heart disease Paternal Grandmother      Social History   Socioeconomic History  . Marital status: Single    Spouse name: Not on file  . Number of children: 3  . Years of education: Not on file  . Highest education level: Not on file  Occupational History  . Occupation: unemployed  Tobacco Use  . Smoking status: Former Research scientist (life sciences)  . Smokeless tobacco: Never Used  Substance and Sexual Activity  . Alcohol use: Yes    Comment: rarely  . Drug use: Never  . Sexual activity: Yes    Birth control/protection: None  Other Topics Concern  . Not on file  Social History Narrative  . Not on file   Social Determinants of Health   Financial Resource Strain:   . Difficulty of Paying Living Expenses:   Food Insecurity:   . Worried About Charity fundraiser in the Last Year:   . Arboriculturist in the Last Year:   Transportation Needs:   . Film/video editor (Medical):   Marland Kitchen Lack of Transportation (Non-Medical):   Physical  Activity:   . Days of Exercise per Week:   . Minutes of Exercise per Session:   Stress:   . Feeling of Stress :   Social Connections:   . Frequency of Communication with Friends and Family:   . Frequency of Social Gatherings with Friends and Family:   . Attends Religious Services:   . Active Member of Clubs or Organizations:   . Attends Archivist Meetings:   Marland Kitchen Marital Status:   Intimate Partner Violence:   . Fear of Current or Ex-Partner:   . Emotionally Abused:   Marland Kitchen Physically Abused:   . Sexually Abused:      No Known Allergies   Outpatient Medications Prior to Visit  Medication Sig Dispense Refill  . albuterol (PROVENTIL HFA;VENTOLIN HFA) 108 (90 Base) MCG/ACT inhaler Inhale 1-2 puffs  into the lungs every 6 (six) hours as needed for wheezing or shortness of breath. 1 Inhaler 3  . atorvastatin (LIPITOR) 20 MG tablet Take 1 tablet (20 mg total) by mouth daily. 30 tablet 6  . Blood Glucose Monitoring Suppl (TRUE METRIX METER) w/Device KIT Use as directed 1 kit 0  . carvedilol (COREG) 25 MG tablet Take 1 tablet (25 mg total) by mouth 2 (two) times daily. 60 tablet 5  . fluticasone (FLONASE) 50 MCG/ACT nasal spray Place 1 spray into both nostrils daily. 15.8 g 6  . furosemide (LASIX) 40 MG tablet Take 1 tablet (40 mg total) by mouth daily as needed. 30 tablet 4  . hydrALAZINE (APRESOLINE) 50 MG tablet Take 1 tablet (50 mg total) by mouth 3 (three) times daily. 90 tablet 0  . levothyroxine (SYNTHROID) 100 MCG tablet TAKE 1/2 TABLET BY MOUTH DAILY BEFORE BREAKFAST (Patient taking differently: Take 50 mcg by mouth daily before breakfast. TAKE 1/2 TABLET BY MOUTH DAILY BEFORE BREAKFAST) 15 tablet 4  . lisinopril (ZESTRIL) 40 MG tablet Take 1 tablet (40 mg total) by mouth daily. 30 tablet 90  . loratadine (CLARITIN) 10 MG tablet Take 1 tablet (10 mg total) by mouth daily as needed for allergies. 30 tablet 2  . metFORMIN (GLUCOPHAGE) 500 MG tablet Take 1 tablet (500 mg total) by mouth daily with breakfast. E11.9 Diabetes type 2 controlled 90 tablet 3  . Multiple Vitamins-Minerals (MULTIVITAMIN WITH MINERALS) tablet Take 1 tablet by mouth daily.    Marland Kitchen PAZEO 0.7 % SOLN as needed.     Marland Kitchen PRADAXA 150 MG CAPS capsule TAKE 1 CAPSULE (150 MG TOTAL) BY MOUTH 2 (TWO) TIMES DAILY. 60 capsule 2  . glucose blood (TRUE METRIX BLOOD GLUCOSE TEST) test strip Check blood sugar fasting and before meals and again if pt feels bad (symptoms of hypo). 100 each 12  . TRUEPLUS LANCETS 28G MISC Check blood sugar fasting and before meals and again if pt feels bad (symptoms of hypo). 100 each 12  . methocarbamol (ROBAXIN) 500 MG tablet Take 2 tablets (1,000 mg total) by mouth every 8 (eight) hours as needed for muscle  spasms. (Patient not taking: Reported on 09/13/2019) 90 tablet 0   No facility-administered medications prior to visit.      Review of Systems Constitutional:   No  weight loss, night sweats,  Fevers, chills, fatigue, lassitude. HEENT:   No headaches,  Difficulty swallowing,  Tooth/dental problems,  Sore throat,                No sneezing, itching, ear ache, nasal congestion, post nasal drip,   CV:   chest pain,  Orthopnea, PND, swelling in lower extremities, anasarca, dizziness, palpitations  GI  No heartburn, indigestion, abdominal pain, nausea, vomiting, diarrhea, change in bowel habits, loss of appetite  Resp: No shortness of breath with exertion or at rest.  No excess mucus, no productive cough,  No non-productive cough,  No coughing up of blood.  No change in color of mucus.  No wheezing.  No chest wall deformity  Skin: no rash or lesions.  GU: no dysuria, change in color of urine, no urgency or frequency.  No flank pain.  MS:  No joint pain or swelling.  No decreased range of motion.  No back pain.  Psych:   change in mood or affect.  depression or anxiety.  No memory loss.     Objective:   Physical Exam Vitals:   09/13/19 0851  BP: 135/88  Pulse: 84  Resp: 16  Temp: 97.9 F (36.6 C)  SpO2: 97%  Weight: 255 lb (115.7 kg)  Height: _0  (1.702 m)    Gen: Pleasant, obese, in no distress,  normal affect  ENT: No lesions,  mouth clear,  oropharynx clear, no postnasal drip  Neck: No JVD, no TMG, no carotid bruits  Lungs: No use of accessory muscles, no dullness to percussion, clear without rales or rhonchi  Cardiovascular: RRR, heart sounds normal, no murmur or gallops, 2+ peripheral edema  Abdomen: soft and NT, no HSM,  BS normal  Musculoskeletal: No deformities, no cyanosis or clubbing  Neuro: alert, non focal  Skin: Warm, no lesions or rashes  CBG 135     Assessment & Plan:  I personally reviewed all images and lab data in the [facility] system as well as  any outside material available during this office visit and agree with the  radiology impressions.   No problem-specific Assessment & Plan notes found for this encounter.   Diagnoses and all orders for this visit:  Essential hypertension  Type 2 diabetes mellitus with obesity ([place]) -     Cancel: HgB A1c -     POCT glucose (manual entry)  PAF (paroxysmal atrial fibrillation) ([place])  Controlled type 2 diabetes mellitus without complication, without long-term current use of insulin ([facility])  Other orders -     glucose blood (TRUE METRIX BLOOD GLUCOSE TEST) test strip; Check blood sugar fasting and before meals and again if pt feels bad (symptoms of hypo). -     TRUEplus Lancets 28G MISC; Check blood sugar fasting and before meals and again if pt feels bad (symptoms of hypo).

## 2019-09-13 NOTE — Telephone Encounter (Signed)
Spoke with the patient. Patient made aware of Dr. Merita Norton recommendation. Advised the patient to bring her BP log with her to her upcoming appt.  Patient agreeable with the plan and verbalized understanding.

## 2019-09-14 ENCOUNTER — Encounter: Payer: Self-pay | Admitting: Critical Care Medicine

## 2019-09-14 NOTE — Assessment & Plan Note (Signed)
No evidence of active heart failure patient to maintain Coreg furosemide and hydralazine at current dose level

## 2019-09-14 NOTE — Assessment & Plan Note (Signed)
Ongoing generalized anxiety disorder I have asked the patient to connect with our licensed clinical social worker for further counseling as her PHQ 9 and gad 7 are elevated

## 2019-09-14 NOTE — Assessment & Plan Note (Signed)
Diabetes under adequate control we will continue current medication profile and I did give patient's refill for her testing supplies

## 2019-09-14 NOTE — Assessment & Plan Note (Signed)
Blood pressure today is under good control and there is been fluctuations in blood pressure.  I did advise the patient to continue the hydralazine 3 times daily and continue Coreg furosemide and lisinopril as prescribed  I recommended the patient follow a strict low-salt diet and I gave her the diet today

## 2019-09-27 ENCOUNTER — Ambulatory Visit: Payer: Medicaid Other | Admitting: Cardiology

## 2019-09-28 ENCOUNTER — Ambulatory Visit (INDEPENDENT_AMBULATORY_CARE_PROVIDER_SITE_OTHER): Payer: Medicaid Other

## 2019-09-28 ENCOUNTER — Other Ambulatory Visit: Payer: Self-pay

## 2019-09-28 ENCOUNTER — Ambulatory Visit: Payer: Medicaid Other | Admitting: Podiatry

## 2019-09-28 ENCOUNTER — Encounter: Payer: Self-pay | Admitting: Podiatry

## 2019-09-28 DIAGNOSIS — M722 Plantar fascial fibromatosis: Secondary | ICD-10-CM

## 2019-09-28 MED ORDER — MELOXICAM 15 MG PO TABS
15.0000 mg | ORAL_TABLET | Freq: Every day | ORAL | 1 refills | Status: DC
Start: 2019-09-28 — End: 2020-03-18

## 2019-09-28 NOTE — Progress Notes (Signed)
   Subjective: 54 y.o. female presenting today as a new patient with a chief complaint of soreness to the right heel that began 5-6 months ago. She states the pain is worse with standing and when she first gets out of bed in the morning. She states she stands all day at work which exacerbates her pain. She has been using insoles and taking Tylenol for treatment. Patient is here for further evaluation and treatment.   Past Medical History:  Diagnosis Date  . Congestive heart failure (CHF) ([facility])   . Diabetes mellitus without complication ([facility])   . HLD (hyperlipidemia)   . Hypertension   . Paroxysmal A-fib ([facility])      Objective: Physical Exam General: The patient is alert and oriented x3 in no acute distress.  Dermatology: Skin is warm, dry and supple bilateral lower extremities. Negative for open lesions or macerations bilateral.   Vascular: Dorsalis Pedis and Posterior Tibial pulses palpable bilateral.  Capillary fill time is immediate to all digits.  Neurological: Epicritic and protective threshold intact bilateral.   Musculoskeletal: Tenderness to palpation to the plantar aspect of the right heel along the plantar fascia. All other joints range of motion within normal limits bilateral. Strength 5/5 in all groups bilateral.   Radiographic exam: Normal osseous mineralization. Joint spaces preserved. No fracture/dislocation/boney destruction. No other soft tissue abnormalities or radiopaque foreign bodies.   Assessment: 1. Plantar fasciitis right  Plan of Care:  1. Patient evaluated. Xrays reviewed.   2. Patient could not tolerate injection. No injection provided.  3. Plantar fascial brace dispensed.  4. Prescription for Meloxicam provided to patient. 5. Return to clinic in 4 weeks.   Works at [employer] in [place].     Felecia Shelling, DPM [facility]  Dr. Felecia Shelling, DPM    [address] [place], [place] [address]                 Office [phone]  Fax [phone]

## 2019-09-29 ENCOUNTER — Emergency Department
Admission: EM | Admit: 2019-09-29 | Discharge: 2019-09-29 | Disposition: A | Discharge status: Home / Self Care | Payer: Medicaid Other | Attending: Emergency Medicine | Admitting: Emergency Medicine

## 2019-09-29 ENCOUNTER — Telehealth: Payer: Self-pay | Admitting: Infectious Diseases

## 2019-09-29 ENCOUNTER — Encounter: Payer: Self-pay | Admitting: Emergency Medicine

## 2019-09-29 ENCOUNTER — Other Ambulatory Visit: Payer: Self-pay

## 2019-09-29 ENCOUNTER — Emergency Department: Payer: Medicaid Other

## 2019-09-29 ENCOUNTER — Other Ambulatory Visit: Payer: Self-pay | Admitting: Infectious Diseases

## 2019-09-29 DIAGNOSIS — I110 Hypertensive heart disease with heart failure: Secondary | ICD-10-CM | POA: Insufficient documentation

## 2019-09-29 DIAGNOSIS — U071 COVID-19: Secondary | ICD-10-CM

## 2019-09-29 DIAGNOSIS — Z79899 Other long term (current) drug therapy: Secondary | ICD-10-CM | POA: Insufficient documentation

## 2019-09-29 DIAGNOSIS — R05 Cough: Secondary | ICD-10-CM | POA: Diagnosis not present

## 2019-09-29 DIAGNOSIS — Z7984 Long term (current) use of oral hypoglycemic drugs: Secondary | ICD-10-CM | POA: Diagnosis not present

## 2019-09-29 DIAGNOSIS — E669 Obesity, unspecified: Secondary | ICD-10-CM

## 2019-09-29 DIAGNOSIS — R0602 Shortness of breath: Secondary | ICD-10-CM | POA: Diagnosis not present

## 2019-09-29 DIAGNOSIS — E1169 Type 2 diabetes mellitus with other specified complication: Secondary | ICD-10-CM

## 2019-09-29 DIAGNOSIS — Z6839 Body mass index (BMI) 39.0-39.9, adult: Secondary | ICD-10-CM

## 2019-09-29 DIAGNOSIS — E039 Hypothyroidism, unspecified: Secondary | ICD-10-CM | POA: Diagnosis not present

## 2019-09-29 DIAGNOSIS — R0981 Nasal congestion: Secondary | ICD-10-CM | POA: Diagnosis not present

## 2019-09-29 DIAGNOSIS — Z87891 Personal history of nicotine dependence: Secondary | ICD-10-CM | POA: Diagnosis not present

## 2019-09-29 DIAGNOSIS — I509 Heart failure, unspecified: Secondary | ICD-10-CM | POA: Insufficient documentation

## 2019-09-29 DIAGNOSIS — E119 Type 2 diabetes mellitus without complications: Secondary | ICD-10-CM | POA: Diagnosis not present

## 2019-09-29 DIAGNOSIS — I10 Essential (primary) hypertension: Secondary | ICD-10-CM

## 2019-09-29 LAB — COMPREHENSIVE METABOLIC PANEL
ALT: 31 U/L (ref 0–44)
AST: 30 U/L (ref 15–41)
Albumin: 3.7 g/dL (ref 3.5–5.0)
Alkaline Phosphatase: 62 U/L (ref 38–126)
Anion gap: 9 (ref 5–15)
BUN: 7 mg/dL (ref 6–20)
CO2: 27 mmol/L (ref 22–32)
Calcium: 9 mg/dL (ref 8.9–10.3)
Chloride: 101 mmol/L (ref 98–111)
Creatinine, Ser: 0.78 mg/dL (ref 0.44–1.00)
GFR calc Af Amer: >60 mL/min (ref >60)
GFR calc non Af Amer: >60 mL/min (ref >60)
Glucose, Bld: 123 mg/dL — ABNORMAL HIGH (ref 70–99)
Potassium: 4 mmol/L (ref 3.5–5.1)
Sodium: 137 mmol/L (ref 135–145)
Total Bilirubin: 0.6 mg/dL (ref 0.3–1.2)
Total Protein: 8.2 g/dL — ABNORMAL HIGH (ref 6.5–8.1)

## 2019-09-29 LAB — CBC WITH DIFFERENTIAL/PLATELET
Abs Immature Granulocytes: 0 10*3/uL (ref 0.00–0.07)
Basophils Absolute: 0 10*3/uL (ref 0.0–0.1)
Basophils Relative: 0 %
Eosinophils Absolute: 0 10*3/uL (ref 0.0–0.5)
Eosinophils Relative: 1 %
HCT: 42.3 % (ref 36.0–46.0)
Hemoglobin: 13.8 g/dL (ref 12.0–15.0)
Immature Granulocytes: 0 %
Lymphocytes Relative: 38 %
Lymphs Abs: 1.4 10*3/uL (ref 0.7–4.0)
MCH: 25.7 pg — ABNORMAL LOW (ref 26.0–34.0)
MCHC: 32.6 g/dL (ref 30.0–36.0)
MCV: 78.9 fL — ABNORMAL LOW (ref 80.0–100.0)
Monocytes Absolute: 0.5 10*3/uL (ref 0.1–1.0)
Monocytes Relative: 14 %
Neutro Abs: 1.7 10*3/uL (ref 1.7–7.7)
Neutrophils Relative %: 47 %
Platelets: 280 10*3/uL (ref 150–400)
RBC: 5.36 MIL/uL — ABNORMAL HIGH (ref 3.87–5.11)
RDW: 15.5 % (ref 11.5–15.5)
WBC: 3.6 10*3/uL — ABNORMAL LOW (ref 4.0–10.5)
nRBC: 0 % (ref 0.0–0.2)

## 2019-09-29 LAB — URINALYSIS, COMPLETE (UACMP) WITH MICROSCOPIC
Bacteria, UA: NONE SEEN
Bilirubin Urine: NEGATIVE
Glucose, UA: NEGATIVE mg/dL
Hgb urine dipstick: NEGATIVE
Ketones, ur: NEGATIVE mg/dL
Leukocytes,Ua: NEGATIVE
Nitrite: NEGATIVE
Protein, ur: 100 mg/dL — AB
Specific Gravity, Urine: 1.014 (ref 1.005–1.030)
pH: 6 (ref 5.0–8.0)

## 2019-09-29 LAB — BRAIN NATRIURETIC PEPTIDE: B Natriuretic Peptide: 12.6 pg/mL (ref 0.0–100.0)

## 2019-09-29 LAB — SARS CORONAVIRUS 2 BY RT PCR (HOSPITAL ORDER, PERFORMED IN ~~LOC~~ HOSPITAL LAB): SARS Coronavirus 2: POSITIVE — AB

## 2019-09-29 LAB — TROPONIN I (HIGH SENSITIVITY): Troponin I (High Sensitivity): 5 ng/L (ref <18)

## 2019-09-29 MED ORDER — IPRATROPIUM-ALBUTEROL 0.5-2.5 (3) MG/3ML IN SOLN
3.0000 mL | Freq: Once | RESPIRATORY_TRACT | Status: AC
  Administered 2019-09-29: 3 mL via RESPIRATORY_TRACT
  Filled 2019-09-29: qty 3

## 2019-09-29 MED ORDER — BENZONATATE 100 MG PO CAPS
100.0000 mg | ORAL_CAPSULE | Freq: Three times a day (TID) | ORAL | 0 refills | Status: DC | PRN
Start: 2019-09-29 — End: 2020-08-11

## 2019-09-29 MED ORDER — SODIUM CHLORIDE 0.9 % IV SOLN
Freq: Once | INTRAVENOUS | Status: AC
  Filled 2019-09-29: qty 700

## 2019-09-29 MED ORDER — SODIUM CHLORIDE 0.9 % IV SOLN
1.0000 g | Freq: Once | INTRAVENOUS | Status: AC
  Administered 2019-09-29: 1 g via INTRAVENOUS
  Filled 2019-09-29: qty 10

## 2019-09-29 MED ORDER — ACETAMINOPHEN 325 MG PO TABS
650.0000 mg | ORAL_TABLET | Freq: Once | ORAL | Status: AC
  Administered 2019-09-29: 650 mg via ORAL
  Filled 2019-09-29: qty 2

## 2019-09-29 MED ORDER — BENZONATATE 100 MG PO CAPS
100.0000 mg | ORAL_CAPSULE | Freq: Once | ORAL | Status: AC
  Administered 2019-09-29: 100 mg via ORAL
  Filled 2019-09-29: qty 1

## 2019-09-29 MED ORDER — DEXAMETHASONE 10 MG/ML FOR PEDIATRIC ORAL USE
10.0000 mg | Freq: Once | INTRAMUSCULAR | Status: AC
  Administered 2019-09-29: 10 mg via ORAL
  Filled 2019-09-29: qty 1

## 2019-09-29 NOTE — ED Notes (Signed)
Patient was able to converse normally with ambulation.

## 2019-09-29 NOTE — Progress Notes (Signed)
  I connected by phone with [name] on 09/29/2019 at 11:51 AM to discuss the potential use of an new treatment for mild to moderate COVID-19 viral infection in non-hospitalized patients.  This patient is a 54 y.o. female that meets the FDA criteria for Emergency Use Authorization of bamlanivimab/etesevimab or casirivimab/imdevimab.  Has a (+) direct SARS-CoV-2 viral test result  Has mild or moderate COVID-19   Is NOT hospitalized due to COVID-19  Is within 10 days of symptom onset  Has at least one of the high risk factor(s) for progression to severe COVID-19 and/or hospitalization as defined in EUA.  Specific high risk criteria : BMI > 25, DM, HTN   I have spoken and communicated the following to the patient or parent/caregiver:  1. FDA has authorized the emergency use of bamlanivimab/etesevimab and casirivimab\imdevimab for the treatment of mild to moderate COVID-19 in adults and pediatric patients with positive results of direct SARS-CoV-2 viral testing who are 45 years of age and older weighing at least 40 kg, and who are at high risk for progressing to severe COVID-19 and/or hospitalization.  2. The significant known and potential risks and benefits of bamlanivimab/etesevimab and casirivimab\imdevimab, and the extent to which such potential risks and benefits are unknown.  3. Information on available alternative treatments and the risks and benefits of those alternatives, including clinical trials.  4. Patients treated with bamlanivimab/etesevimab and casirivimab\imdevimab should continue to self-isolate and use infection control measures (e.g., wear mask, isolate, social distance, avoid sharing personal items, clean and disinfect "high touch" surfaces, and frequent handwashing) according to CDC guidelines.   5. The patient or parent/caregiver has the option to accept or refuse bamlanivimab/etesevimab or casirivimab\imdevimab .  After reviewing this information with the patient,  The patient agreed to proceed with receiving the bamlanimivab infusion and will be provided a copy of the Fact sheet prior to receiving the infusion.Rexene Alberts 09/29/2019 11:51 AM

## 2019-09-29 NOTE — ED Notes (Signed)
Pt c/o productive cough x3 days. Pt denies any soreness in her throat or n/v and diarrhea.

## 2019-09-29 NOTE — Telephone Encounter (Signed)
Called to discuss with patient about Covid symptoms and the use of bamlanivimab, a monoclonal antibody infusion for those with mild to moderate Covid symptoms and at a high risk of hospitalization.  Pt is qualified for this infusion at the [facility] infusion center due to BMI>25, DM, HTN.   Seen in ER today 09/29/2019 with 4 days of cough symptoms. COVID testing returned (+).    She has been scheduled 10:30 am Sunday 5/30. I helped her activate her MyChart account so she can get information about appointment details.    Rexene Alberts, MSN, NP-C [facility] for Infectious Disease [facility]  [email or web address] Pager: [phone] Office: [phone] [facility] Main Line: [phone]

## 2019-09-29 NOTE — ED Provider Notes (Signed)
[facility] Emergency Department Provider Note  ____________________________________________  Time seen: Approximately 8:32 AM  I have reviewed the triage vital signs and the nursing notes.   HISTORY  Chief Complaint Cough    HPI Jennifer Miller is a 54 y.o. female that presents to emergency department for evaluation of nonproductive cough and shortness of breath for 3 to 4 days.  Patient has been using an inhaler to help with her shortness of breath and taking Tylenol.  She is unsure of fever.  She has had bronchitis before.  She did have some sinus congestion but this has improved.  Past Medical History:  Diagnosis Date  . Congestive heart failure (CHF) ([place])   . Diabetes mellitus without complication ([place])   . HLD (hyperlipidemia)   . Hypertension   . Paroxysmal A-fib [facility])     Patient Active Problem List   Diagnosis Date Noted  . Type 2 diabetes mellitus with obesity ([place]) 07/24/2019  . Generalized anxiety disorder 08/28/2018  . Controlled type 2 diabetes mellitus without complication, without long-term current use of insulin ([place]) 12/30/2017  . Essential hypertension 12/30/2017  . Iron deficiency anemia 12/30/2017  . PAF (paroxysmal atrial fibrillation) ([place]) 12/30/2017  . Acquired hypothyroidism 12/30/2017  . History of colon polyps 12/30/2017  . Hyperlipidemia 12/30/2017  . Congestive heart failure ([place]) 12/30/2017    Past Surgical History:  Procedure Laterality Date  . DILATION AND CURETTAGE OF UTERUS    . KNEE ARTHROSCOPY AND ARTHROTOMY      Prior to Admission medications   Medication Sig Start Date End Date Taking? Authorizing Provider  albuterol (PROVENTIL HFA;VENTOLIN HFA) 108 (90 Base) MCG/ACT inhaler Inhale 1-2 puffs into the lungs every 6 (six) hours as needed for wheezing or shortness of breath. 08/15/18   Ladell Pier, MD  atorvastatin (LIPITOR) 20 MG tablet Take 1 tablet (20 mg total) by mouth daily. 12/29/17   Ladell Pier, MD  benzonatate (TESSALON PERLES) 100 MG capsule Take 1 capsule (100 mg total) by mouth 3 (three) times daily as needed. 09/29/19 09/28/20  Laban Emperor, PA-C  Blood Glucose Monitoring Suppl (TRUE METRIX METER) w/Device KIT Use as directed 05/12/18   Ladell Pier, MD  carvedilol (COREG) 25 MG tablet Take 1 tablet (25 mg total) by mouth 2 (two) times daily. 09/10/19 12/09/19  Kate Sable, MD  fluticasone (FLONASE) 50 MCG/ACT nasal spray Place 1 spray into both nostrils daily. 03/23/19   Ladell Pier, MD  furosemide (LASIX) 40 MG tablet Take 1 tablet (40 mg total) by mouth daily as needed. 09/04/19   Kate Sable, MD  glucose blood (TRUE METRIX BLOOD GLUCOSE TEST) test strip Check blood sugar fasting and before meals and again if pt feels bad (symptoms of hypo). 09/13/19   Elsie Stain, MD  hydrALAZINE (APRESOLINE) 50 MG tablet Take 1 tablet (50 mg total) by mouth 3 (three) times daily. 09/09/19 10/09/19  Lilia Pro., MD  levothyroxine (SYNTHROID) 100 MCG tablet TAKE 1/2 TABLET BY MOUTH DAILY BEFORE BREAKFAST Patient taking differently: Take 50 mcg by mouth daily before breakfast. TAKE 1/2 TABLET BY MOUTH DAILY BEFORE BREAKFAST 03/23/19   Ladell Pier, MD  lisinopril (ZESTRIL) 40 MG tablet Take 1 tablet (40 mg total) by mouth daily. 09/10/19 12/09/19  Kate Sable, MD  loratadine (CLARITIN) 10 MG tablet Take 1 tablet (10 mg total) by mouth daily as needed for allergies. 03/23/19   Ladell Pier, MD  meloxicam (MOBIC) 15 MG tablet Take  1 tablet (15 mg total) by mouth daily. 09/28/19   Edrick Kins, DPM  metFORMIN (GLUCOPHAGE) 500 MG tablet Take 1 tablet (500 mg total) by mouth daily with breakfast. E11.9 Diabetes type 2 controlled 03/30/19   Ladell Pier, MD  Multiple Vitamins-Minerals (MULTIVITAMIN WITH MINERALS) tablet Take 1 tablet by mouth daily.    [provider]  PAZEO 0.7 % SOLN as needed.  12/15/18   [provider]   PRADAXA 150 MG CAPS capsule TAKE 1 CAPSULE (150 MG TOTAL) BY MOUTH 2 (TWO) TIMES DAILY. 09/12/19   Ladell Pier, MD  TRUEplus Lancets 28G MISC Check blood sugar fasting and before meals and again if pt feels bad (symptoms of hypo). 09/13/19   Elsie Stain, MD    Allergies Patient has no known allergies.  Family History  Problem Relation Age of Onset  . Diabetes Mother   . Hypertension Mother   . Dementia Mother   . Diabetes Father   . Hypertension Father   . Diabetes Maternal Grandmother   . Diabetes Paternal Grandmother   . Heart disease Paternal Grandmother     Social History Social History   Tobacco Use  . Smoking status: Former Research scientist (life sciences)  . Smokeless tobacco: Never Used  Substance Use Topics  . Alcohol use: Yes    Comment: rarely  . Drug use: Never     Review of Systems  Constitutional: No fever/chills Eyes: No visual changes. No discharge. ENT: Negative for congestion and rhinorrhea. Cardiovascular: No chest pain. Respiratory: Positive for cough and SOB. Gastrointestinal: No abdominal pain.  No nausea, no vomiting.  No diarrhea.  No constipation. Musculoskeletal: Negative for musculoskeletal pain. Skin: Negative for rash, abrasions, lacerations, ecchymosis. Neurological: Negative for headaches.   ____________________________________________   PHYSICAL EXAM:  VITAL SIGNS: ED Triage Vitals  Enc Vitals Group     BP 09/29/19 0743 (S) (!) 153/97     Pulse Rate 09/29/19 0743 (!) 115     Resp 09/29/19 0743 18     Temp 09/29/19 0743 99.2 F (37.3 C)     Temp Source 09/29/19 0743 Oral     SpO2 09/29/19 0743 94 %     Weight 09/29/19 0744 255 lb (115.7 kg)     Height 09/29/19 0744 '5\' 7"'  (1.702 m)     Head Circumference --      Peak Flow --      Pain Score 09/29/19 0744 7     Pain Loc --      Pain Edu? --      Excl. in [place]? --      Constitutional: Alert and oriented. Well appearing and in no acute distress. Eyes: Conjunctivae are normal. PERRL.  EOMI. No discharge. Head: Atraumatic. ENT: No frontal and maxillary sinus tenderness.      Ears: Tympanic membranes pearly gray with good landmarks. No discharge.      Nose: No congestion/rhinnorhea.      Mouth/Throat: Mucous membranes are moist.  Neck: No stridor.   Hematological/Lymphatic/Immunilogical: No cervical lymphadenopathy. Cardiovascular: Normal rate, regular rhythm.  Good peripheral circulation. Respiratory: Normal respiratory effort without tachypnea or retractions. Lungs CTAB. Good air entry to the bases with no decreased or absent breath sounds. Gastrointestinal: Bowel sounds 4 quadrants. Soft and nontender to palpation. No guarding or rigidity. No palpable masses. No distention. Musculoskeletal: Full range of motion to all extremities. No gross deformities appreciated. Neurologic:  Normal speech and language. No gross focal neurologic deficits are appreciated.  Skin:  Skin is warm, dry and intact. No rash noted. Psychiatric: Mood and affect are normal. Speech and behavior are normal. Patient exhibits appropriate insight and judgement.   ____________________________________________   LABS (all labs ordered are listed, but only abnormal results are displayed)  Labs Reviewed  SARS CORONAVIRUS 2 BY RT PCR ([facility]) - Abnormal; Notable for the following components:      Result Value   SARS Coronavirus 2 POSITIVE (*)    All other components within normal limits  COMPREHENSIVE METABOLIC PANEL - Abnormal; Notable for the following components:   Glucose, Bld 123 (*)    Total Protein 8.2 (*)    All other components within normal limits  CBC WITH DIFFERENTIAL/PLATELET - Abnormal; Notable for the following components:   WBC 3.6 (*)    RBC 5.36 (*)    MCV 78.9 (*)    MCH 25.7 (*)    All other components within normal limits  URINALYSIS, COMPLETE (UACMP) WITH MICROSCOPIC - Abnormal; Notable for the following components:   Color,  Urine YELLOW (*)    APPearance HAZY (*)    Protein, ur 100 (*)    All other components within normal limits  BRAIN NATRIURETIC PEPTIDE  TROPONIN I (HIGH SENSITIVITY)   ____________________________________________  EKG   ____________________________________________  RADIOLOGY Robinette Haines, personally viewed and evaluated these images (plain radiographs) as part of my medical decision making, as well as reviewing the written report by the radiologist.  DG Chest 1 View  Result Date: 09/29/2019 CLINICAL DATA:  Cough and chest congestion EXAM: CHEST  1 VIEW COMPARISON:  02/01/2019 chest radiograph. FINDINGS: Stable cardiomediastinal silhouette with mild cardiomegaly. No pneumothorax. No pleural effusion. Mild diffuse prominence of the parahilar interstitial markings with faint hazy opacities in mid to lower lungs. IMPRESSION: Mild cardiomegaly, mild diffuse prominence of the parahilar interstitial markings and faint hazy opacities in the mid to lower lungs, which may represent mild congestive heart failure versus atypical infection. Electronically Signed   By: Ilona Sorrel M.D.   On: 09/29/2019 08:34    ____________________________________________    PROCEDURES  Procedure(s) performed:    Procedures    Medications  acetaminophen (TYLENOL) tablet 650 mg (650 mg Oral Given 09/29/19 0901)  dexamethasone (DECADRON) 10 MG/ML injection for Pediatric ORAL use 10 mg (10 mg Oral Given 09/29/19 1011)  ipratropium-albuterol (DUONEB) 0.5-2.5 (3) MG/3ML nebulizer solution 3 mL (3 mLs Nebulization Given 09/29/19 1012)  cefTRIAXone (ROCEPHIN) 1 g in sodium chloride 0.9 % 100 mL IVPB (0 g Intravenous Stopped 09/29/19 1046)  benzonatate (TESSALON) capsule 100 mg (100 mg Oral Given 09/29/19 1044)     ____________________________________________   INITIAL IMPRESSION / ASSESSMENT AND PLAN / ED COURSE  Pertinent labs & imaging results that were available during my care of the patient were  reviewed by me and considered in my medical decision making (see chart for details).  Review of the [place] CSRS was performed in accordance of the [place] prior to dispensing any controlled drugs.   Patient's diagnosis is consistent with COVID-19. Vital signs and exam are reassuring.  Covid test is positive.  Chest x-ray shows mild diffuse prominence of the perihilar interstitial markings.  No leukocytosis.  Troponin and BNP within normal limits.  CMP largely unremarkable.  Oxygen stayed above 94% with ambulation and patient was able to hold a conversation.  Patient was given IV Decadron for inflammation.  Patient was given IV Rocephin prior to return of positive Covid test.  Patient was set up with the infusion clinic for an appointment tomorrow for monoclonal antibodies.  Patient is agreeable with this plan and her daughter will take her to the appointment tomorrow.  Patient appears well and is staying well hydrated.  Patient feels comfortable going home. Patient will be discharged home with prescriptions for tessalon perles. Patient is to follow up with PCP as needed or otherwise directed. Patient is given ED precautions to return to the ED for any worsening or new symptoms.   Jennifer Miller was evaluated in Emergency Department on 09/29/2019 for the symptoms described in the history of present illness. She was evaluated in the context of the global COVID-19 pandemic, which necessitated consideration that the patient might be at risk for infection with the SARS-CoV-2 virus that causes COVID-19. Institutional protocols and algorithms that pertain to the evaluation of patients at risk for COVID-19 are in a state of rapid change based on information released by regulatory bodies including the CDC and federal and state organizations. These policies and algorithms were followed during the patient's care in the ED.  ____________________________________________  FINAL CLINICAL IMPRESSION(S) / ED DIAGNOSES  Final  diagnoses:  COVID-19      NEW MEDICATIONS STARTED DURING THIS VISIT:  ED Discharge Orders         Ordered    benzonatate (TESSALON PERLES) 100 MG capsule  3 times daily PRN     09/29/19 1233              This chart was dictated using voice recognition software/Dragon. Despite best efforts to proofread, errors can occur which can change the meaning. Any change was purely unintentional.    Laban Emperor, PA-C 09/29/19 1542    Blake Divine, MD 09/29/19 [phone]

## 2019-09-29 NOTE — ED Triage Notes (Signed)
Pt arrived via POV with reports of cough x 3-4 days, pt states she is taking tylenol and using inhaler to help with sxs, states feels like bronchitis flare up which she has had before. Pt c/o some chest congestion.

## 2019-09-30 ENCOUNTER — Ambulatory Visit (HOSPITAL_COMMUNITY)
Admission: RE | Admit: 2019-09-30 | Discharge: 2019-09-30 | Disposition: A | Discharge status: Home / Self Care | Payer: Medicaid Other | Source: Ambulatory Visit | Attending: Pulmonary Disease | Admitting: Pulmonary Disease

## 2019-09-30 DIAGNOSIS — E669 Obesity, unspecified: Secondary | ICD-10-CM | POA: Diagnosis not present

## 2019-09-30 DIAGNOSIS — U071 COVID-19: Secondary | ICD-10-CM | POA: Diagnosis not present

## 2019-09-30 DIAGNOSIS — E1169 Type 2 diabetes mellitus with other specified complication: Secondary | ICD-10-CM | POA: Diagnosis not present

## 2019-09-30 DIAGNOSIS — Z6839 Body mass index (BMI) 39.0-39.9, adult: Secondary | ICD-10-CM

## 2019-09-30 DIAGNOSIS — I10 Essential (primary) hypertension: Secondary | ICD-10-CM | POA: Diagnosis not present

## 2019-09-30 MED ORDER — DIPHENHYDRAMINE HCL 50 MG/ML IJ SOLN: 50.0000 mg | Freq: Once | INTRAMUSCULAR | Status: DC | PRN

## 2019-09-30 MED ORDER — FAMOTIDINE IN NACL 20-0.9 MG/50ML-% IV SOLN: 20.0000 mg | Freq: Once | INTRAVENOUS | Status: DC | PRN

## 2019-09-30 MED ORDER — EPINEPHRINE 0.3 MG/0.3ML IJ SOAJ: 0.3000 mg | Freq: Once | INTRAMUSCULAR | Status: DC | PRN

## 2019-09-30 MED ORDER — METHYLPREDNISOLONE SODIUM SUCC 125 MG IJ SOLR: 125.0000 mg | Freq: Once | INTRAMUSCULAR | Status: DC | PRN

## 2019-09-30 MED ORDER — SODIUM CHLORIDE 0.9 % IV SOLN: INTRAVENOUS | Status: DC | PRN

## 2019-09-30 MED ORDER — ALBUTEROL SULFATE HFA 108 (90 BASE) MCG/ACT IN AERS: 2.0000 | INHALATION_SPRAY | Freq: Once | RESPIRATORY_TRACT | Status: DC | PRN

## 2019-09-30 NOTE — Discharge Instructions (Signed)

## 2019-09-30 NOTE — Progress Notes (Addendum)
  Diagnosis: COVID-19  Physician:Dr Delford Field  Procedure: Covid Infusion Clinic Med: bamlanivimab\etesevimab infusion - Provided patient with bamlanimivab\etesevimab fact sheet for patients, parents and caregivers prior to infusion.  Complications: No immediate complications noted.  Discharge: Discharged home   Jennifer Miller 09/30/2019

## 2019-10-10 ENCOUNTER — Ambulatory Visit: Payer: Medicaid Other | Attending: Internal Medicine | Admitting: Physician Assistant

## 2019-10-10 ENCOUNTER — Other Ambulatory Visit: Payer: Self-pay

## 2019-10-10 ENCOUNTER — Telehealth: Payer: Self-pay | Admitting: Cardiology

## 2019-10-10 DIAGNOSIS — E1169 Type 2 diabetes mellitus with other specified complication: Secondary | ICD-10-CM | POA: Diagnosis not present

## 2019-10-10 DIAGNOSIS — R0981 Nasal congestion: Secondary | ICD-10-CM | POA: Diagnosis not present

## 2019-10-10 DIAGNOSIS — I480 Paroxysmal atrial fibrillation: Secondary | ICD-10-CM

## 2019-10-10 DIAGNOSIS — U071 COVID-19: Secondary | ICD-10-CM | POA: Diagnosis not present

## 2019-10-10 DIAGNOSIS — E669 Obesity, unspecified: Secondary | ICD-10-CM

## 2019-10-10 MED ORDER — FLUTICASONE PROPIONATE 50 MCG/ACT NA SUSP: 1.0000 | Freq: Every day | NASAL | 6 refills | Status: DC

## 2019-10-10 MED ORDER — ALBUTEROL SULFATE HFA 108 (90 BASE) MCG/ACT IN AERS: 1.0000 | INHALATION_SPRAY | Freq: Four times a day (QID) | RESPIRATORY_TRACT | 1 refills | Status: DC | PRN

## 2019-10-10 MED ORDER — DABIGATRAN ETEXILATE MESYLATE 150 MG PO CAPS: ORAL_CAPSULE | ORAL | 2 refills | Status: DC

## 2019-10-10 NOTE — Progress Notes (Signed)
Patient ID: Jennifer Miller, female   DOB: Sep 15, 1965, 54 y.o.   MRN: [id number]     Virtual Visit via Telephone Note  I connected with [name] on 10/10/19 at 11:10 AM EDT by telephone and verified that I am speaking with the correct person using two identifiers.   I discussed the limitations, risks, security and privacy concerns of performing an evaluation and management service by telephone and the availability of in person appointments. I also discussed with the patient that there may be a patient responsible charge related to this service. The patient expressed understanding and agreed to proceed.  PATIENT visit by telephone virtually in the context of Covid-19 pandemic. Patient location:  home My Location:  [facility] office Persons on the call: me and the patient  History of Present Illness: After being seen in the ED 09/29/2019 and diagnosed with Covid 19.  Glucose 123.  Glucose has been good.  She has gone back to work.  Has minimal cough.  No GI s/sx.  No fever.  Feeling good overall.  She did have antibody infusion.  No SOB.  Energy and appetite are good.     Observations/Objective: NAD.  A&Ox3   Assessment and Plan: 1. Type 2 diabetes mellitus with obesity (HCC) Controlled currently.  Continue current regimen  2. Sinus congestion - fluticasone (FLONASE) 50 MCG/ACT nasal spray; Place 1 spray into both nostrils daily.  Dispense: 15.8 g; Refill: 6  3. PAF (paroxysmal atrial fibrillation) (HCC) - dabigatran (PRADAXA) 150 MG CAPS capsule; TAKE 1 CAPSULE (150 MG TOTAL) BY MOUTH 2 (TWO) TIMES DAILY.  Dispense: 60 capsule; Refill: 2  4. COVID-19 - albuterol (VENTOLIN HFA) 108 (90 Base) MCG/ACT inhaler; Inhale 1-2 puffs into the lungs every 6 (six) hours as needed for wheezing or shortness of breath.  Dispense: 18 g; Refill: 1   Follow Up Instructions: See PCP in 6-8 weeks   I discussed the assessment and treatment plan with the patient. The patient was provided an opportunity to  ask questions and all were answered. The patient agreed with the plan and demonstrated an understanding of the instructions.   The patient was advised to call back or seek an in-person evaluation if the symptoms worsen or if the condition fails to improve as anticipated.  I provided 11 minutes of non-face-to-face time during this encounter.   Georgian Co, PA-C

## 2019-10-10 NOTE — Telephone Encounter (Signed)
    COVID-19 Pre-Screening Questions:  . In the past 7 to 10 days have you had a cough,  shortness of breath, headache, congestion, fever (100 or greater) body aches, chills, sore throat, or sudden loss of taste or sense of smell? NO . Have you been around anyone with known Covid 19. self . Have you been around anyone who is awaiting Covid 19 test results in the past 7 to 10 days? no . Have you been around anyone who has been exposed to Covid 19, or has mentioned symptoms of Covid 19 within the past 7 to 10 days? NO   PATIENT CALLING TO CONFIRM APPT. NO SYMPTOMS CONFIRMED AND LAST COVID TEST 5/29 POSITIVE.  THIS IS > 10 DAY PER POLICY PATIENT CAN KEEP VISIT .   If you have any concerns/questions about symptoms patients report during screening (either on the phone or at threshold). Contact the provider seeing the patient or DOD for further guidance.  If neither are available contact a member of the leadership team.

## 2019-10-11 ENCOUNTER — Encounter: Payer: Self-pay | Admitting: Cardiology

## 2019-10-11 ENCOUNTER — Other Ambulatory Visit: Payer: Self-pay

## 2019-10-11 ENCOUNTER — Ambulatory Visit (INDEPENDENT_AMBULATORY_CARE_PROVIDER_SITE_OTHER): Payer: Medicaid Other | Admitting: Cardiology

## 2019-10-11 VITALS — BP 144/102 | HR 80 | Ht 67.0 in | Wt 249.0 lb

## 2019-10-11 DIAGNOSIS — I10 Essential (primary) hypertension: Secondary | ICD-10-CM | POA: Diagnosis not present

## 2019-10-11 DIAGNOSIS — I480 Paroxysmal atrial fibrillation: Secondary | ICD-10-CM

## 2019-10-11 MED ORDER — HYDROCHLOROTHIAZIDE 25 MG PO TABS
25.0000 mg | ORAL_TABLET | Freq: Every day | ORAL | 3 refills | Status: DC
Start: 2019-10-11 — End: 2020-02-19

## 2019-10-11 NOTE — Progress Notes (Signed)
Cardiology Office Note:    Date:  10/11/2019   ID:  Jennifer Miller, DOB [birth year], MRN [id number]  PCP:  Ladell Pier, MD  Cardiologist:  Kate Sable, MD  Electrophysiologist:  None   Referring MD: Ladell Pier, MD   Chief Complaint  Patient presents with  . Other    2 week follow up for medication. Meds reviewed verbally with patient     History of Present Illness:    Jennifer Miller is a 54 y.o. female with a hx of hypertension, paroxysmal atrial fibrillation on Pradaxa, diabetes,who presents for follow-up.  She is being seen due to elevated blood pressures and paroxysmal atrial fibrillation.  She was started on Coreg after last visit help with both blood pressure and heart rates.  She endorses eating salty foods (fried wings, pizza).  She has not been taking her medication as prescribed.  Has not taking hydralazine because it made her blood pressure too low.  Takes lisinopril twice daily instead of once a day.   Historical notes Patient was diagnosed with congestive heart failure about 19 years ago after having a baby.  She was fine before.  She recently moved to the area from [place].  She was placed on medications including Metroprolol tartrate, lisinopril, Lasix 40 mg every day.  She denies edema, orthopnea.  About 6 years ago, she states eating 3 hot dogs and drinking some soda while at her mother's home.  She later on noticed her heart was beating fast and irregular.  This prompted her to go to the ED.  She was evaluated and told she was in atrial fibrillation.  She was started on anticoagulation which she has taken since, currently takes Pradaxa twice daily.  She is a former smoker, denies any history of MI.  Amlodipine was stopped due to edema.  Past Medical History:  Diagnosis Date  . Congestive heart failure (CHF) ([place])   . Diabetes mellitus without complication ([place])   . HLD (hyperlipidemia)   . Hypertension   . Paroxysmal A-fib [facility])     Past Surgical  History:  Procedure Laterality Date  . DILATION AND CURETTAGE OF UTERUS    . KNEE ARTHROSCOPY AND ARTHROTOMY      Current Medications: Current Meds  Medication Sig  . albuterol (VENTOLIN HFA) 108 (90 Base) MCG/ACT inhaler Inhale 1-2 puffs into the lungs every 6 (six) hours as needed for wheezing or shortness of breath.  Marland Kitchen atorvastatin (LIPITOR) 20 MG tablet Take 1 tablet (20 mg total) by mouth daily.  . benzonatate (TESSALON PERLES) 100 MG capsule Take 1 capsule (100 mg total) by mouth 3 (three) times daily as needed.  . Blood Glucose Monitoring Suppl (TRUE METRIX METER) w/Device KIT Use as directed  . carvedilol (COREG) 25 MG tablet Take 1 tablet (25 mg total) by mouth 2 (two) times daily.  . dabigatran (PRADAXA) 150 MG CAPS capsule TAKE 1 CAPSULE (150 MG TOTAL) BY MOUTH 2 (TWO) TIMES DAILY.  . fluticasone (FLONASE) 50 MCG/ACT nasal spray Place 1 spray into both nostrils daily.  . furosemide (LASIX) 40 MG tablet Take 1 tablet (40 mg total) by mouth daily as needed.  Marland Kitchen glucose blood (TRUE METRIX BLOOD GLUCOSE TEST) test strip Check blood sugar fasting and before meals and again if pt feels bad (symptoms of hypo).  Marland Kitchen levothyroxine (SYNTHROID) 100 MCG tablet TAKE 1/2 TABLET BY MOUTH DAILY BEFORE BREAKFAST (Patient taking differently: Take 50 mcg by mouth daily before breakfast. TAKE 1/2 TABLET BY MOUTH  DAILY BEFORE BREAKFAST)  . lisinopril (ZESTRIL) 40 MG tablet Take 1 tablet (40 mg total) by mouth daily.  Marland Kitchen loratadine (CLARITIN) 10 MG tablet Take 1 tablet (10 mg total) by mouth daily as needed for allergies.  . meloxicam (MOBIC) 15 MG tablet Take 1 tablet (15 mg total) by mouth daily.  . metFORMIN (GLUCOPHAGE) 500 MG tablet Take 1 tablet (500 mg total) by mouth daily with breakfast. E11.9 Diabetes type 2 controlled  . Multiple Vitamins-Minerals (MULTIVITAMIN WITH MINERALS) tablet Take 1 tablet by mouth daily.  Marland Kitchen PAZEO 0.7 % SOLN as needed.   . TRUEplus Lancets 28G MISC Check blood sugar  fasting and before meals and again if pt feels bad (symptoms of hypo).     Allergies:   Patient has no known allergies.   Social History   Socioeconomic History  . Marital status: Single    Spouse name: Not on file  . Number of children: 3  . Years of education: Not on file  . Highest education level: Not on file  Occupational History  . Occupation: unemployed  Tobacco Use  . Smoking status: Former Research scientist (life sciences)  . Smokeless tobacco: Never Used  Vaping Use  . Vaping Use: Never used  Substance and Sexual Activity  . Alcohol use: Yes    Comment: rarely  . Drug use: Never  . Sexual activity: Yes    Birth control/protection: None  Other Topics Concern  . Not on file  Social History Narrative  . Not on file   Social Determinants of Health   Financial Resource Strain:   . Difficulty of Paying Living Expenses:   Food Insecurity:   . Worried About Charity fundraiser in the Last Year:   . Arboriculturist in the Last Year:   Transportation Needs:   . Film/video editor (Medical):   Marland Kitchen Lack of Transportation (Non-Medical):   Physical Activity:   . Days of Exercise per Week:   . Minutes of Exercise per Session:   Stress:   . Feeling of Stress :   Social Connections:   . Frequency of Communication with Friends and Family:   . Frequency of Social Gatherings with Friends and Family:   . Attends Religious Services:   . Active Member of Clubs or Organizations:   . Attends Archivist Meetings:   Marland Kitchen Marital Status:      Family History: The patient's family history includes Dementia in her mother; Diabetes in her father, maternal grandmother, mother, and paternal grandmother; Heart disease in her paternal grandmother; Hypertension in her father and mother.  ROS:   Please see the history of present illness.     All other systems reviewed and are negative.  EKGs/Labs/Other Studies Reviewed:    The following studies were reviewed today:   EKG:  EKG not ordered today.    Recent Labs: 07/25/2019: TSH 2.330 09/29/2019: ALT 31; B Natriuretic Peptide 12.6; BUN 7; Creatinine, Ser 0.78; Hemoglobin 13.8; Platelets 280; Potassium 4.0; Sodium 137  Recent Lipid Panel    Component Value Date/Time   CHOL 134 03/23/2019 1643   TRIG 72 03/23/2019 1643   HDL 43 03/23/2019 1643   CHOLHDL 3.1 03/23/2019 1643   LDLCALC 76 03/23/2019 1643    Physical Exam:    VS:  BP (!) 144/102 (BP Location: Left Arm, Patient Position: Sitting, Cuff Size: Normal)   Pulse 80   Ht '5\' 7"'  (1.702 m)   Wt 249 lb (112.9 kg)   SpO2  98%   BMI 39.00 kg/m     Wt Readings from Last 3 Encounters:  10/11/19 249 lb (112.9 kg)  09/29/19 255 lb (115.7 kg)  09/13/19 255 lb (115.7 kg)     GEN:  Well nourished, well developed in no acute distress HEENT: Normal NECK: No JVD; No carotid bruits LYMPHATICS: No lymphadenopathy CARDIAC: RRR, no murmurs, rubs, gallops RESPIRATORY:  Clear to auscultation without rales, wheezing or rhonchi  ABDOMEN: Soft, non-tender, non-distended, obese MUSCULOSKELETAL: no edema; No deformity  SKIN: Warm and dry NEUROLOGIC:  Alert and oriented x 3 PSYCHIATRIC:  Normal affect   ASSESSMENT:    1. Essential hypertension   2. PAF (paroxysmal atrial fibrillation) (HCC)    PLAN:    In order of problems listed above;  1. Patient with history of uncontrolled hypertension.  Stop hydralazine, start HCTZ 25 mg daily, continue Coreg, continue lisinopril 40 mg daily.  Patient advised to cut back on salt intake.  Monitor blood pressures at home and keep BP log. 2. History of paroxysmal A. fib, currently in sinus rhythm.  Start Coreg 25 mg, continue Pradaxa.  Follow-up in 4 weeks.   Medication Adjustments/Labs and Tests Ordered: Current medicines are reviewed at length with the patient today.  Concerns regarding medicines are outlined above.  No orders of the defined types were placed in this encounter.  Meds ordered this encounter  Medications  .  hydrochlorothiazide (HYDRODIURIL) 25 MG tablet    Sig: Take 1 tablet (25 mg total) by mouth daily.    Dispense:  30 tablet    Refill:  3    There are no Patient Instructions on file for this visit.   Signed, Kate Sable, MD  10/11/2019 9:55 AM    [facility]

## 2019-10-11 NOTE — Patient Instructions (Signed)
Medication Instructions:   Your physician has recommended you make the following change in your medication:   1. Start Hydrochlorothiazide:  Take 1 tablet (25 mg total) by mouth daily 2. Make sure you are taking your Lisinopril 40 mg as only 1 tablet by mouth once a day.  *If you need a refill on your cardiac medications before your next appointment, please call your pharmacy*   Lab Work: None Ordered. If you have labs (blood work) drawn today and your tests are completely normal, you will receive your results only by: Marland Kitchen MyChart Message (if you have MyChart) OR . A paper copy in the mail If you have any lab test that is abnormal or we need to change your treatment, we will call you to review the results.   Testing/Procedures: None Ordered.   Follow-Up: At [facility], you and your health needs are our priority.  As part of our continuing mission to provide you with exceptional heart care, we have created designated Provider Care Teams.  These Care Teams include your primary Cardiologist (physician) and Advanced Practice Providers (APPs -  Physician Assistants and Nurse Practitioners) who all work together to provide you with the care you need, when you need it.  We recommend signing up for the patient portal called "MyChart".  Sign up information is provided on this After Visit Summary.  MyChart is used to connect with patients for Virtual Visits (Telemedicine).  Patients are able to view lab/test results, encounter notes, upcoming appointments, etc.  Non-urgent messages can be sent to your provider as well.   To learn more about what you can do with MyChart, go to [email or web address].    Your next appointment:   1 month(s)  The format for your next appointment:   In Person  Provider:   Debbe Odea, MD   Other Instructions N/A

## 2019-10-18 ENCOUNTER — Other Ambulatory Visit: Payer: Self-pay

## 2019-10-18 ENCOUNTER — Ambulatory Visit (INDEPENDENT_AMBULATORY_CARE_PROVIDER_SITE_OTHER): Payer: Medicaid Other | Admitting: Podiatry

## 2019-10-18 ENCOUNTER — Encounter: Payer: Self-pay | Admitting: Podiatry

## 2019-10-18 DIAGNOSIS — M79675 Pain in left toe(s): Secondary | ICD-10-CM

## 2019-10-18 DIAGNOSIS — E119 Type 2 diabetes mellitus without complications: Secondary | ICD-10-CM | POA: Diagnosis not present

## 2019-10-18 DIAGNOSIS — B351 Tinea unguium: Secondary | ICD-10-CM | POA: Diagnosis not present

## 2019-10-18 DIAGNOSIS — M79674 Pain in right toe(s): Secondary | ICD-10-CM | POA: Diagnosis not present

## 2019-10-18 NOTE — Progress Notes (Signed)
This patient returns to my office for at risk foot care.  This patient requires this care by a professional since this patient will be at risk due to having  diabetes.    This patient is unable to cut nails herself since the patient cannot reach her nails.These nails are painful walking and wearing shoes.  This patient presents for at risk foot care today.  General Appearance  Alert, conversant and in no acute stress.  Vascular  Dorsalis pedis and posterior tibial  pulses are palpable  bilaterally.  Capillary return is within normal limits  bilaterally. Temperature is within normal limits  bilaterally.  Neurologic  Senn-Weinstein monofilament wire test within normal limits  bilaterally. Muscle power within normal limits bilaterally.  Nails Thick disfigured discolored nails with subungual debris  from hallux to fifth toes bilaterally. No evidence of bacterial infection or drainage bilaterally.  Orthopedic  No limitations of motion  feet .  No crepitus or effusions noted.  No bony pathology or digital deformities noted.  Skin  normotropic skin with no porokeratosis noted bilaterally.  No signs of infections or ulcers noted.   Right heel callus medially.  Onychomycosis  Pain in right toes  Pain in left toes  Consent was obtained for treatment procedures.   Mechanical debridement of nails 1-5  bilaterally performed with a nail nipper.  No dremel  usage since there was nail polish on her nails. Patient is under treatment of plantar fasciitis by Dr.  Logan Bores.   Return office visit   prn                  Told patient to return for periodic foot care and evaluation due to potential at risk complications.   Helane Gunther DPM

## 2019-10-26 ENCOUNTER — Telehealth: Payer: Self-pay | Admitting: Cardiology

## 2019-10-26 ENCOUNTER — Ambulatory Visit: Payer: Medicaid Other | Admitting: Internal Medicine

## 2019-10-26 ENCOUNTER — Telehealth: Payer: Self-pay | Admitting: Internal Medicine

## 2019-10-26 DIAGNOSIS — E039 Hypothyroidism, unspecified: Secondary | ICD-10-CM

## 2019-10-26 MED ORDER — LEVOTHYROXINE SODIUM 100 MCG PO TABS: ORAL_TABLET | ORAL | 4 refills | Status: DC

## 2019-10-26 NOTE — Telephone Encounter (Signed)
No sooner visit available with [facility]   Adding to waitlist

## 2019-10-26 NOTE — Addendum Note (Signed)
Addended by: Jonah Blue B on: 10/26/2019 01:26 PM   Modules accepted: Orders

## 2019-10-26 NOTE — Telephone Encounter (Signed)
1) Medication(s) Requested (by name): levothyroxine (SYNTHROID) 100 MCG tablet   2) Pharmacy of Choice: [facility] on [address].   3) Special Requests:   Approved medications will be sent to the pharmacy, we will reach out if there is an issue.  Requests made after 3pm may not be addressed until the following business day!  If a patient is unsure of the name of the medication(s) please note and ask patient to call back when they are able to provide all info, do not send to responsible party until all information is available!

## 2019-10-26 NOTE — Telephone Encounter (Signed)
Left a VM with patient that she has been put on waitlist and that we would be in touch if Dr. Azucena Cecil had any further recommendations.

## 2019-10-26 NOTE — Telephone Encounter (Signed)
Patient states feeling flutters and palpitations for the last 1.5 weeks. She thinks it is due to the Coreg she started 6 weeks ago, so she did not take it this morning. 5/28-6/6 had been on quarantine for COVID. Patient was given antibodies on 5/29. Patient reports she is feeling like she did when she was in Afib approximately 20 years ago.    BP this morning was 155/90, was not sure of her pulse rate. She reports BP has been normally in the 130's, and that she checks it every morning before she takes her medications.  Dr. Azucena Cecil saw the patient in office on 6/10. Patient had seemed to be tolerating the Coreg well. I advised her to go ahead and take her AM dose. She states that she is drinking at minimum 4, 8oz water bottles daily. I advised to increase that amount if possible and to continue to monitor BP/HR once daily and if symptomatic.   I informed her I would forward our discussion to Dr. Azucena Cecil for further recommendations and also see if we could get her an appointment sooner than her scheduled 11/09/19.   Patient also agreed that if she were to become SOB or develop any persistent chest pain, that she would go to the ER for evaluation.

## 2019-10-26 NOTE — Telephone Encounter (Signed)
Pt c/o medication issue:  1. Name of Medication: coreg  2. How are you currently taking this medication (dosage and times per day)? 25 mg morning and evening (has not taken this morning)  3. Are you having a reaction (difficulty breathing--STAT)?  4. What is your medication issue?  palpitations and fluttering  Patient states she doesn't like how she feels now, she states this goes on everyday almost all day. Has a history of A fib Please advise

## 2019-10-30 ENCOUNTER — Ambulatory Visit: Payer: Medicaid Other | Admitting: Podiatry

## 2019-11-06 ENCOUNTER — Ambulatory Visit: Payer: Medicaid Other | Admitting: Podiatry

## 2019-11-06 ENCOUNTER — Encounter: Payer: Self-pay | Admitting: Podiatry

## 2019-11-06 ENCOUNTER — Other Ambulatory Visit: Payer: Self-pay

## 2019-11-06 DIAGNOSIS — E119 Type 2 diabetes mellitus without complications: Secondary | ICD-10-CM

## 2019-11-06 DIAGNOSIS — M722 Plantar fascial fibromatosis: Secondary | ICD-10-CM

## 2019-11-06 NOTE — Progress Notes (Signed)
   Subjective: 54 y.o. female presenting today for follow-up treatment and evaluation regarding plantar fasciitis to the right foot.  Patient states she is feeling much better.  The meloxicam and plantar fascial brace helps.  She wears the brace daily.  No new complaints at this time   Past Medical History:  Diagnosis Date  . Congestive heart failure (CHF) ([facility])   . Diabetes mellitus without complication ([facility])   . HLD (hyperlipidemia)   . Hypertension   . Paroxysmal A-fib ([facility])      Objective: Physical Exam General: The patient is alert and oriented x3 in no acute distress.  Dermatology: Skin is warm, dry and supple bilateral lower extremities. Negative for open lesions or macerations bilateral.   Vascular: Dorsalis Pedis and Posterior Tibial pulses palpable bilateral.  Capillary fill time is immediate to all digits.  Neurological: Epicritic and protective threshold intact bilateral.   Musculoskeletal: Improved tenderness to palpation to the plantar aspect of the right heel along the plantar fascia. All other joints range of motion within normal limits bilateral. Strength 5/5 in all groups bilateral.   Radiographic exam: Normal osseous mineralization. Joint spaces preserved. No fracture/dislocation/boney destruction. No other soft tissue abnormalities or radiopaque foreign bodies.   Assessment: 1. Plantar fasciitis right  Plan of Care:  1. Patient evaluated.   2.  Continue plantar fascial brace 3.  Patient may now discontinue meloxicam.  Recommend OTC Tylenol as needed 4.  Recommend good supportive tennis shoes 5.  Return to clinic as needed  Works at [employer] in [place].     Felecia Shelling, DPM [facility]  Dr. Felecia Shelling, DPM    [address] [place], [place] [address]                Office [phone]  Fax [phone]

## 2019-11-09 ENCOUNTER — Other Ambulatory Visit: Payer: Self-pay

## 2019-11-09 ENCOUNTER — Encounter: Payer: Self-pay | Admitting: Cardiology

## 2019-11-09 ENCOUNTER — Ambulatory Visit (INDEPENDENT_AMBULATORY_CARE_PROVIDER_SITE_OTHER): Payer: Medicaid Other | Admitting: Cardiology

## 2019-11-09 VITALS — BP 122/80 | HR 90 | Ht 67.0 in | Wt 250.2 lb

## 2019-11-09 DIAGNOSIS — I10 Essential (primary) hypertension: Secondary | ICD-10-CM

## 2019-11-09 DIAGNOSIS — I480 Paroxysmal atrial fibrillation: Secondary | ICD-10-CM | POA: Diagnosis not present

## 2019-11-09 MED ORDER — METOPROLOL TARTRATE 50 MG PO TABS
50.0000 mg | ORAL_TABLET | Freq: Two times a day (BID) | ORAL | 6 refills | Status: DC
Start: 2019-11-09 — End: 2020-07-14

## 2019-11-09 NOTE — Patient Instructions (Signed)
Medication Instructions:   Your physician has recommended you make the following change in your medication:   1.  STOP taking your Coreg (Carvedilol). 2.  START taking Lopressor (Metoprolol Tartrate): Take 1 tablet (50 mg total) by mouth 2 (two) times daily.  *If you need a refill on your cardiac medications before your next appointment, please call your pharmacy*   Lab Work: None Ordered If you have labs (blood work) drawn today and your tests are completely normal, you will receive your results only by: Marland Kitchen MyChart Message (if you have MyChart) OR . A paper copy in the mail If you have any lab test that is abnormal or we need to change your treatment, we will call you to review the results.   Testing/Procedures: None Ordered   Follow-Up: At [facility], you and your health needs are our priority.  As part of our continuing mission to provide you with exceptional heart care, we have created designated Provider Care Teams.  These Care Teams include your primary Cardiologist (physician) and Advanced Practice Providers (APPs -  Physician Assistants and Nurse Practitioners) who all work together to provide you with the care you need, when you need it.  We recommend signing up for the patient portal called "MyChart".  Sign up information is provided on this After Visit Summary.  MyChart is used to connect with patients for Virtual Visits (Telemedicine).  Patients are able to view lab/test results, encounter notes, upcoming appointments, etc.  Non-urgent messages can be sent to your provider as well.   To learn more about what you can do with MyChart, go to [email or web address].    Your next appointment:   4 week(s)  The format for your next appointment:   In Person  Provider:   Debbe Odea, MD   Other Instructions N/A

## 2019-11-09 NOTE — Progress Notes (Signed)
Cardiology Office Note:    Date:  11/09/2019   ID:  Jennifer Miller, DOB 05/26/1965, MRN [id number]  PCP:  Ladell Pier, MD  Cardiologist:  Kate Sable, MD  Electrophysiologist:  None   Referring MD: Ladell Pier, MD   Chief Complaint  Patient presents with  . other    1 month follow up. Meds reviewed by the pt.'s bottles. Pt. c/o chest pain and palpitations with a cough.     History of Present Illness:    Jennifer Miller is a 54 y.o. female with a hx of hypertension, paroxysmal atrial fibrillation on Pradaxa, diabetes,who presents for follow-up.  She is being seen due to elevated blood pressures and paroxysmal atrial fibrillation.  Her medications were adjusted after last visit.  Hydralazine was stopped, HCTZ was started.  Low-salt diet encouraged as patient was not adherent to a low-salt diet.  Over the past several weeks, she has noticed frequent palpitations.  Symptoms were similar to when she was initially diagnosed with A. fib.  Symptoms were better controlled when she was on metoprolol tartrate.   Historical notes Patient was diagnosed with congestive heart failure about 19 years ago after having a baby.  She was fine before.  She recently moved to the area from [place].  She was placed on medications including Metroprolol tartrate, lisinopril, Lasix 40 mg every day.  She denies edema, orthopnea.  About 6 years ago, she states eating 3 hot dogs and drinking some soda while at her mother's home.  She later on noticed her heart was beating fast and irregular.  This prompted her to go to the ED.  She was evaluated and told she was in atrial fibrillation.  She was started on anticoagulation which she has taken since, currently takes Pradaxa twice daily.  She is a former smoker, denies any history of MI.  Amlodipine was stopped due to edema.  Past Medical History:  Diagnosis Date  . Congestive heart failure (CHF) ([place])   . Diabetes mellitus without complication ([place])   .  HLD (hyperlipidemia)   . Hypertension   . Paroxysmal A-fib [facility])     Past Surgical History:  Procedure Laterality Date  . DILATION AND CURETTAGE OF UTERUS    . KNEE ARTHROSCOPY AND ARTHROTOMY      Current Medications: Current Meds  Medication Sig  . albuterol (VENTOLIN HFA) 108 (90 Base) MCG/ACT inhaler Inhale 1-2 puffs into the lungs every 6 (six) hours as needed for wheezing or shortness of breath.  Marland Kitchen atorvastatin (LIPITOR) 20 MG tablet Take 1 tablet (20 mg total) by mouth daily.  . benzonatate (TESSALON PERLES) 100 MG capsule Take 1 capsule (100 mg total) by mouth 3 (three) times daily as needed.  . Blood Glucose Monitoring Suppl (TRUE METRIX METER) w/Device KIT Use as directed  . dabigatran (PRADAXA) 150 MG CAPS capsule TAKE 1 CAPSULE (150 MG TOTAL) BY MOUTH 2 (TWO) TIMES DAILY.  . fluticasone (FLONASE) 50 MCG/ACT nasal spray Place 1 spray into both nostrils daily.  . furosemide (LASIX) 40 MG tablet Take 1 tablet (40 mg total) by mouth daily as needed.  Marland Kitchen glucose blood (TRUE METRIX BLOOD GLUCOSE TEST) test strip Check blood sugar fasting and before meals and again if pt feels bad (symptoms of hypo).  . hydrochlorothiazide (HYDRODIURIL) 25 MG tablet Take 1 tablet (25 mg total) by mouth daily.  Marland Kitchen levothyroxine (SYNTHROID) 100 MCG tablet TAKE 1/2 TABLET BY MOUTH DAILY BEFORE BREAKFAST  . lisinopril (ZESTRIL) 40 MG tablet  Take 1 tablet (40 mg total) by mouth daily.  Marland Kitchen loratadine (CLARITIN) 10 MG tablet Take 1 tablet (10 mg total) by mouth daily as needed for allergies.  . meloxicam (MOBIC) 15 MG tablet Take 1 tablet (15 mg total) by mouth daily.  . metFORMIN (GLUCOPHAGE) 500 MG tablet Take 1 tablet (500 mg total) by mouth daily with breakfast. E11.9 Diabetes type 2 controlled  . Multiple Vitamins-Minerals (MULTIVITAMIN WITH MINERALS) tablet Take 1 tablet by mouth daily.  Marland Kitchen PAZEO 0.7 % SOLN as needed.   . TRUEplus Lancets 28G MISC Check blood sugar fasting and before meals and again if  pt feels bad (symptoms of hypo).  . [DISCONTINUED] carvedilol (COREG) 25 MG tablet Take 1 tablet (25 mg total) by mouth 2 (two) times daily.     Allergies:   Patient has no known allergies.   Social History   Socioeconomic History  . Marital status: Single    Spouse name: Not on file  . Number of children: 3  . Years of education: Not on file  . Highest education level: Not on file  Occupational History  . Occupation: unemployed  Tobacco Use  . Smoking status: Former Research scientist (life sciences)  . Smokeless tobacco: Never Used  Vaping Use  . Vaping Use: Never used  Substance and Sexual Activity  . Alcohol use: Yes    Comment: rarely  . Drug use: Never  . Sexual activity: Yes    Birth control/protection: None  Other Topics Concern  . Not on file  Social History Narrative  . Not on file   Social Determinants of Health   Financial Resource Strain:   . Difficulty of Paying Living Expenses:   Food Insecurity:   . Worried About Charity fundraiser in the Last Year:   . Arboriculturist in the Last Year:   Transportation Needs:   . Film/video editor (Medical):   Marland Kitchen Lack of Transportation (Non-Medical):   Physical Activity:   . Days of Exercise per Week:   . Minutes of Exercise per Session:   Stress:   . Feeling of Stress :   Social Connections:   . Frequency of Communication with Friends and Family:   . Frequency of Social Gatherings with Friends and Family:   . Attends Religious Services:   . Active Member of Clubs or Organizations:   . Attends Archivist Meetings:   Marland Kitchen Marital Status:      Family History: The patient's family history includes Dementia in her mother; Diabetes in her father, maternal grandmother, mother, and paternal grandmother; Heart disease in her paternal grandmother; Hypertension in her father and mother.  ROS:   Please see the history of present illness.     All other systems reviewed and are negative.  EKGs/Labs/Other Studies Reviewed:    The  following studies were reviewed today:   EKG:  EKG ordered today.  Normal sinus rhythm, normal ECG. Recent Labs: 07/25/2019: TSH 2.330 09/29/2019: ALT 31; B Natriuretic Peptide 12.6; BUN 7; Creatinine, Ser 0.78; Hemoglobin 13.8; Platelets 280; Potassium 4.0; Sodium 137  Recent Lipid Panel    Component Value Date/Time   CHOL 134 03/23/2019 1643   TRIG 72 03/23/2019 1643   HDL 43 03/23/2019 1643   CHOLHDL 3.1 03/23/2019 1643   LDLCALC 76 03/23/2019 1643    Physical Exam:    VS:  BP 122/80 (BP Location: Left Arm, Patient Position: Sitting, Cuff Size: Normal)   Pulse 90   Ht '5\' 7"'  (  1.702 m)   Wt 250 lb 4 oz (113.5 kg)   SpO2 98%   BMI 39.19 kg/m     Wt Readings from Last 3 Encounters:  11/09/19 250 lb 4 oz (113.5 kg)  10/11/19 249 lb (112.9 kg)  09/29/19 255 lb (115.7 kg)     GEN:  Well nourished, well developed in no acute distress HEENT: Normal NECK: No JVD; No carotid bruits LYMPHATICS: No lymphadenopathy CARDIAC: RRR, no murmurs, rubs, gallops RESPIRATORY:  Clear to auscultation without rales, wheezing or rhonchi  ABDOMEN: Soft, non-tender, non-distended, obese MUSCULOSKELETAL: no edema; No deformity  SKIN: Warm and dry NEUROLOGIC:  Alert and oriented x 3 PSYCHIATRIC:  Normal affect   ASSESSMENT:    1. Essential hypertension   2. PAF (paroxysmal atrial fibrillation) (HCC)    PLAN:    In order of problems listed above;  1. Patient with history of uncontrolled hypertension.  Blood pressure controlled today.  Continue HCTZ 25 mg daily,  lisinopril 40 mg daily.  Stop Coreg and start Lopressor.  Continued adherence to low-salt diet encouraged. 2. History of paroxysmal A. fib, currently in sinus rhythm.  Frequent symptoms of palpitations noted likely A. fib due to history.  EKG today shows sinus rhythm.  Start Lopressor 50 mg twice daily, stop Coreg 25 mg, continue Pradaxa.  Increase Lopressor if needed to manage heart rate and patient symptoms.  Follow-up in 4  weeks  Medication Adjustments/Labs and Tests Ordered: Current medicines are reviewed at length with the patient today.  Concerns regarding medicines are outlined above.  Orders Placed This Encounter  Procedures  . EKG 12-Lead   Meds ordered this encounter  Medications  . metoprolol tartrate (LOPRESSOR) 50 MG tablet    Sig: Take 1 tablet (50 mg total) by mouth 2 (two) times daily.    Dispense:  60 tablet    Refill:  6    Patient Instructions  Medication Instructions:   Your physician has recommended you make the following change in your medication:   1.  STOP taking your Coreg (Carvedilol). 2.  START taking Lopressor (Metoprolol Tartrate): Take 1 tablet (50 mg total) by mouth 2 (two) times daily.  *If you need a refill on your cardiac medications before your next appointment, please call your pharmacy*   Lab Work: None Ordered If you have labs (blood work) drawn today and your tests are completely normal, you will receive your results only by: Marland Kitchen MyChart Message (if you have MyChart) OR . A paper copy in the mail If you have any lab test that is abnormal or we need to change your treatment, we will call you to review the results.   Testing/Procedures: None Ordered   Follow-Up: At [facility], you and your health needs are our priority.  As part of our continuing mission to provide you with exceptional heart care, we have created designated Provider Care Teams.  These Care Teams include your primary Cardiologist (physician) and Advanced Practice Providers (APPs -  Physician Assistants and Nurse Practitioners) who all work together to provide you with the care you need, when you need it.  We recommend signing up for the patient portal called "MyChart".  Sign up information is provided on this After Visit Summary.  MyChart is used to connect with patients for Virtual Visits (Telemedicine).  Patients are able to view lab/test results, encounter notes, upcoming appointments,  etc.  Non-urgent messages can be sent to your provider as well.   To learn more about what  you can do with MyChart, go to [email or web address].    Your next appointment:   4 week(s)  The format for your next appointment:   In Person  Provider:   Kate Sable, MD   Other Instructions N/A    Signed, Kate Sable, MD  11/09/2019 5:06 PM    [place]

## 2019-11-12 ENCOUNTER — Ambulatory Visit: Payer: Medicaid Other | Admitting: Cardiology

## 2019-12-13 ENCOUNTER — Ambulatory Visit: Payer: Medicaid Other | Admitting: Cardiology

## 2019-12-19 ENCOUNTER — Other Ambulatory Visit: Payer: Self-pay | Admitting: Physician Assistant

## 2019-12-19 DIAGNOSIS — I480 Paroxysmal atrial fibrillation: Secondary | ICD-10-CM

## 2020-01-14 DIAGNOSIS — R519 Headache, unspecified: Secondary | ICD-10-CM | POA: Diagnosis not present

## 2020-01-14 DIAGNOSIS — Z20822 Contact with and (suspected) exposure to covid-19: Secondary | ICD-10-CM | POA: Diagnosis not present

## 2020-01-14 DIAGNOSIS — Z03818 Encounter for observation for suspected exposure to other biological agents ruled out: Secondary | ICD-10-CM | POA: Diagnosis not present

## 2020-01-17 ENCOUNTER — Other Ambulatory Visit: Payer: Self-pay

## 2020-01-17 ENCOUNTER — Encounter: Payer: Self-pay | Admitting: Cardiology

## 2020-01-17 ENCOUNTER — Ambulatory Visit (INDEPENDENT_AMBULATORY_CARE_PROVIDER_SITE_OTHER): Payer: Medicaid Other | Admitting: Cardiology

## 2020-01-17 VITALS — BP 138/96 | HR 76 | Ht 67.0 in | Wt 259.6 lb

## 2020-01-17 DIAGNOSIS — I480 Paroxysmal atrial fibrillation: Secondary | ICD-10-CM | POA: Diagnosis not present

## 2020-01-17 DIAGNOSIS — E7800 Pure hypercholesterolemia, unspecified: Secondary | ICD-10-CM | POA: Diagnosis not present

## 2020-01-17 DIAGNOSIS — I10 Essential (primary) hypertension: Secondary | ICD-10-CM | POA: Diagnosis not present

## 2020-01-17 MED ORDER — DABIGATRAN ETEXILATE MESYLATE 150 MG PO CAPS: ORAL_CAPSULE | ORAL | 3 refills | Status: DC

## 2020-01-17 NOTE — Patient Instructions (Signed)

## 2020-01-17 NOTE — Progress Notes (Signed)
Cardiology Office Note:    Date:  01/17/2020   ID:  Jennifer Miller, DOB December 16, 1965, MRN [id number]  PCP:  Ladell Pier, MD  Cardiologist:  Kate Sable, MD  Electrophysiologist:  None   Referring MD: Ladell Pier, MD   Chief Complaint  Patient presents with  . Follow-up    4 Week follow up. Medications verbally reviewed with patient.     History of Present Illness:    Jennifer Miller is a 54 y.o. female with a hx of hypertension, paroxysmal atrial fibrillation on Pradaxa, diabetes,who presents for follow-up.  She is being seen due to elevated blood pressures and paroxysmal atrial fibrillation.  Coreg was stopped after last visit, Lopressor started.  She states her blood pressure is improved.  She has reduced the amount of salt in her diet to help with hypertension.  Still has occasional episodes of palpitations but this has significantly decreased and not bothersome.   Historical notes Patient was diagnosed with congestive heart failure about 19 years ago after having a baby.  She was fine before.  She recently moved to the area from [place].  She was placed on medications including Metroprolol tartrate, lisinopril, Lasix 40 mg every day.  She denies edema, orthopnea.  About 6 years ago, she states eating 3 hot dogs and drinking some soda while at her mother's home.  She later on noticed her heart was beating fast and irregular.  This prompted her to go to the ED.  She was evaluated and told she was in atrial fibrillation.  She was started on anticoagulation which she has taken since, currently takes Pradaxa twice daily.  She is a former smoker, denies any history of MI.  Amlodipine was stopped due to edema.  Past Medical History:  Diagnosis Date  . Congestive heart failure (CHF) ([place])   . Diabetes mellitus without complication ([place])   . HLD (hyperlipidemia)   . Hypertension   . Paroxysmal A-fib [facility])     Past Surgical History:  Procedure Laterality Date  . DILATION  AND CURETTAGE OF UTERUS    . KNEE ARTHROSCOPY AND ARTHROTOMY      Current Medications: Current Meds  Medication Sig  . albuterol (VENTOLIN HFA) 108 (90 Base) MCG/ACT inhaler Inhale 1-2 puffs into the lungs every 6 (six) hours as needed for wheezing or shortness of breath.  Marland Kitchen atorvastatin (LIPITOR) 20 MG tablet Take 1 tablet (20 mg total) by mouth daily.  . benzonatate (TESSALON PERLES) 100 MG capsule Take 1 capsule (100 mg total) by mouth 3 (three) times daily as needed.  . Blood Glucose Monitoring Suppl (TRUE METRIX METER) w/Device KIT Use as directed  . dabigatran (PRADAXA) 150 MG CAPS capsule TAKE 1 CAPSULE(150 MG) BY MOUTH TWICE DAILY  . fluticasone (FLONASE) 50 MCG/ACT nasal spray Place 1 spray into both nostrils daily.  Marland Kitchen glucose blood (TRUE METRIX BLOOD GLUCOSE TEST) test strip Check blood sugar fasting and before meals and again if pt feels bad (symptoms of hypo).  . hydrochlorothiazide (HYDRODIURIL) 25 MG tablet Take 1 tablet (25 mg total) by mouth daily.  Marland Kitchen levothyroxine (SYNTHROID) 100 MCG tablet TAKE 1/2 TABLET BY MOUTH DAILY BEFORE BREAKFAST  . lisinopril (ZESTRIL) 40 MG tablet Take 1 tablet (40 mg total) by mouth daily.  Marland Kitchen loratadine (CLARITIN) 10 MG tablet Take 1 tablet (10 mg total) by mouth daily as needed for allergies.  . meloxicam (MOBIC) 15 MG tablet Take 1 tablet (15 mg total) by mouth daily.  . metFORMIN (  GLUCOPHAGE) 500 MG tablet Take 1 tablet (500 mg total) by mouth daily with breakfast. E11.9 Diabetes type 2 controlled  . metoprolol tartrate (LOPRESSOR) 50 MG tablet Take 1 tablet (50 mg total) by mouth 2 (two) times daily.  . Multiple Vitamins-Minerals (MULTIVITAMIN WITH MINERALS) tablet Take 1 tablet by mouth daily.  Marland Kitchen PAZEO 0.7 % SOLN as needed.   . TRUEplus Lancets 28G MISC Check blood sugar fasting and before meals and again if pt feels bad (symptoms of hypo).  Marland Kitchen VITAMIN D, CHOLECALCIFEROL, PO Take by mouth.  . [DISCONTINUED] dabigatran (PRADAXA) 150 MG CAPS  capsule TAKE 1 CAPSULE(150 MG) BY MOUTH TWICE DAILY     Allergies:   Patient has no known allergies.   Social History   Socioeconomic History  . Marital status: Single    Spouse name: Not on file  . Number of children: 3  . Years of education: Not on file  . Highest education level: Not on file  Occupational History  . Occupation: unemployed  Tobacco Use  . Smoking status: Former Research scientist (life sciences)  . Smokeless tobacco: Never Used  Vaping Use  . Vaping Use: Never used  Substance and Sexual Activity  . Alcohol use: Yes    Comment: rarely  . Drug use: Never  . Sexual activity: Yes    Birth control/protection: None  Other Topics Concern  . Not on file  Social History Narrative  . Not on file   Social Determinants of Health   Financial Resource Strain:   . Difficulty of Paying Living Expenses: Not on file  Food Insecurity:   . Worried About Charity fundraiser in the Last Year: Not on file  . Ran Out of Food in the Last Year: Not on file  Transportation Needs:   . Lack of Transportation (Medical): Not on file  . Lack of Transportation (Non-Medical): Not on file  Physical Activity:   . Days of Exercise per Week: Not on file  . Minutes of Exercise per Session: Not on file  Stress:   . Feeling of Stress : Not on file  Social Connections:   . Frequency of Communication with Friends and Family: Not on file  . Frequency of Social Gatherings with Friends and Family: Not on file  . Attends Religious Services: Not on file  . Active Member of Clubs or Organizations: Not on file  . Attends Archivist Meetings: Not on file  . Marital Status: Not on file     Family History: The patient's family history includes Dementia in her mother; Diabetes in her father, maternal grandmother, mother, and paternal grandmother; Heart disease in her paternal grandmother; Hypertension in her father and mother.  ROS:   Please see the history of present illness.     All other systems reviewed  and are negative.  EKGs/Labs/Other Studies Reviewed:    The following studies were reviewed today:   EKG:  EKG not ordered today.  Recent Labs: 07/25/2019: TSH 2.330 09/29/2019: ALT 31; B Natriuretic Peptide 12.6; BUN 7; Creatinine, Ser 0.78; Hemoglobin 13.8; Platelets 280; Potassium 4.0; Sodium 137  Recent Lipid Panel    Component Value Date/Time   CHOL 134 03/23/2019 1643   TRIG 72 03/23/2019 1643   HDL 43 03/23/2019 1643   CHOLHDL 3.1 03/23/2019 1643   LDLCALC 76 03/23/2019 1643    Physical Exam:    VS:  BP (!) 138/96 (BP Location: Left Arm, Patient Position: Sitting, Cuff Size: Normal)   Pulse 76  Ht '5\' 7"'  (1.702 m)   Wt 259 lb 9.6 oz (117.8 kg)   SpO2 96%   BMI 40.66 kg/m     Wt Readings from Last 3 Encounters:  01/17/20 259 lb 9.6 oz (117.8 kg)  11/09/19 250 lb 4 oz (113.5 kg)  10/11/19 249 lb (112.9 kg)     GEN:  Well nourished, well developed in no acute distress HEENT: Normal NECK: No JVD; No carotid bruits LYMPHATICS: No lymphadenopathy CARDIAC: RRR, no murmurs, rubs, gallops RESPIRATORY:  Clear to auscultation without rales, wheezing or rhonchi  ABDOMEN: Soft, non-tender, non-distended, obese MUSCULOSKELETAL: no edema; No deformity  SKIN: Warm and dry NEUROLOGIC:  Alert and oriented x 3 PSYCHIATRIC:  Normal affect   ASSESSMENT:    1. Essential hypertension   2. PAF (paroxysmal atrial fibrillation) (HCC)   3. Pure hypercholesterolemia   4. PAF (paroxysmal atrial fibrillation) (HCC) Chronic   PLAN:    In order of problems listed above;  1. Patient with history of uncontrolled hypertension.  Blood pressure controlled today.  Continue HCTZ 25 mg daily,  lisinopril 40 mg daily, Lopressor 50 mg twice daily.  Patient encouraged to continue low-salt diet.  2. History of paroxysmal A. fib, currently in sinus rhythm.  Continue Lopressor 50 mg twice daily, Pradaxa.  90-day refills for Pradaxa given.  3. History of hyperlipidemia, continue  Lipitor.  Follow-up in 6 months  Total encounter time 35 minutes  Greater than 50% was spent in counseling and coordination of care with the patient  Medication Adjustments/Labs and Tests Ordered: Current medicines are reviewed at length with the patient today.  Concerns regarding medicines are outlined above.  No orders of the defined types were placed in this encounter.  Meds ordered this encounter  Medications  . dabigatran (PRADAXA) 150 MG CAPS capsule    Sig: TAKE 1 CAPSULE(150 MG) BY MOUTH TWICE DAILY    Dispense:  180 capsule    Refill:  3    ZERO refills remain on this prescription. Your patient is requesting advance approval of refills for this medication to [place]    Patient Instructions  Medication Instructions:  Your physician recommends that you continue on your current medications as directed. Please refer to the Current Medication list given to you today.  *If you need a refill on your cardiac medications before your next appointment, please call your pharmacy*   Lab Work: None Ordered If you have labs (blood work) drawn today and your tests are completely normal, you will receive your results only by: Marland Kitchen MyChart Message (if you have MyChart) OR . A paper copy in the mail If you have any lab test that is abnormal or we need to change your treatment, we will call you to review the results.   Testing/Procedures: None Ordered   Follow-Up: At [facility], you and your health needs are our priority.  As part of our continuing mission to provide you with exceptional heart care, we have created designated Provider Care Teams.  These Care Teams include your primary Cardiologist (physician) and Advanced Practice Providers (APPs -  Physician Assistants and Nurse Practitioners) who all work together to provide you with the care you need, when you need it.  We recommend signing up for the patient portal called "MyChart".  Sign up information is  provided on this After Visit Summary.  MyChart is used to connect with patients for Virtual Visits (Telemedicine).  Patients are able to view lab/test results, encounter notes, upcoming  appointments, etc.  Non-urgent messages can be sent to your provider as well.   To learn more about what you can do with MyChart, go to [email or web address].    Your next appointment:   6 month(s)  The format for your next appointment:   In Person  Provider:   Kate Sable, MD   Other Instructions      Signed, Kate Sable, MD  01/17/2020 1:02 PM    [place]

## 2020-02-15 ENCOUNTER — Telehealth: Payer: Self-pay | Admitting: Cardiology

## 2020-02-15 NOTE — Telephone Encounter (Signed)
*  STAT* If patient is at the pharmacy, call can be transferred to refill team.   1. Which medications need to be refilled? (please list name of each medication and dose if known) hydrocholoride 25 mg daily  2. Which pharmacy/location (including street and city if local pharmacy) is medication to be sent to? [facility] on [address]  3. Do they need a 30 day or 90 day supply? 90

## 2020-02-19 ENCOUNTER — Other Ambulatory Visit: Payer: Self-pay

## 2020-02-19 MED ORDER — HYDROCHLOROTHIAZIDE 25 MG PO TABS: 25.0000 mg | ORAL_TABLET | Freq: Every day | ORAL | 3 refills | Status: DC

## 2020-03-03 ENCOUNTER — Encounter: Payer: Self-pay | Admitting: Cardiology

## 2020-03-03 ENCOUNTER — Ambulatory Visit (INDEPENDENT_AMBULATORY_CARE_PROVIDER_SITE_OTHER): Payer: Medicaid Other | Admitting: Cardiology

## 2020-03-03 ENCOUNTER — Other Ambulatory Visit
Admission: RE | Admit: 2020-03-03 | Discharge: 2020-03-03 | Disposition: A | Discharge status: Home / Self Care | Payer: Medicaid Other | Attending: Cardiology | Admitting: Cardiology

## 2020-03-03 ENCOUNTER — Telehealth: Payer: Self-pay | Admitting: Cardiology

## 2020-03-03 ENCOUNTER — Other Ambulatory Visit: Payer: Self-pay

## 2020-03-03 VITALS — BP 120/72 | HR 72 | Ht 67.0 in | Wt 259.0 lb

## 2020-03-03 DIAGNOSIS — E7800 Pure hypercholesterolemia, unspecified: Secondary | ICD-10-CM | POA: Diagnosis not present

## 2020-03-03 DIAGNOSIS — I480 Paroxysmal atrial fibrillation: Secondary | ICD-10-CM

## 2020-03-03 DIAGNOSIS — I10 Essential (primary) hypertension: Secondary | ICD-10-CM

## 2020-03-03 DIAGNOSIS — R079 Chest pain, unspecified: Secondary | ICD-10-CM | POA: Diagnosis not present

## 2020-03-03 LAB — BASIC METABOLIC PANEL
Anion gap: 9 (ref 5–15)
BUN: 14 mg/dL (ref 6–20)
CO2: 30 mmol/L (ref 22–32)
Calcium: 10 mg/dL (ref 8.9–10.3)
Chloride: 98 mmol/L (ref 98–111)
Creatinine, Ser: 0.71 mg/dL (ref 0.44–1.00)
GFR, Estimated: >60 mL/min (ref >60)
Glucose, Bld: 109 mg/dL — ABNORMAL HIGH (ref 70–99)
Potassium: 3.7 mmol/L (ref 3.5–5.1)
Sodium: 137 mmol/L (ref 135–145)

## 2020-03-03 MED ORDER — HYDROCHLOROTHIAZIDE 50 MG PO TABS: 50.0000 mg | ORAL_TABLET | Freq: Every day | ORAL | 5 refills | Status: DC

## 2020-03-03 NOTE — Telephone Encounter (Signed)
Spoke with patient, she described some chest pain in the upper right side of her chest that felt like a muscle spasm lasting like 3 minutes while drinking her coffee this morning. She then took her morning medications and felt better but still sore. Yesterday she had felt some brief chest pain as well, with some pain in the upper left of her back.  Patient worried because last time she felt chest pain it was when she was diagnosed with CHF, and is feeling very anxious about it.  I scheduled patient in Dr. Merita Norton DOD slot for 3:20 this afternoon. She was very grateful for the accommodations, and call back.

## 2020-03-03 NOTE — Telephone Encounter (Signed)
Pt c/o of Chest Pain: STAT if CP now or developed within 24 hours  1. Are you having CP right now? Not like it was yesterday  2. Are you experiencing any other symptoms (ex. SOB, nausea, vomiting, sweating)?   3. How long have you been experiencing CP? Just yesterday  4. Is your CP continuous or coming and going? Continual, which caused patient to be anxious  5. Have you taken Nitroglycerin? no ?

## 2020-03-03 NOTE — Patient Instructions (Signed)
Medication Instructions:  Your physician recommends that you continue on your current medications as directed. Please refer to the Current Medication list given to you today.  *If you need a refill on your cardiac medications before your next appointment, please call your pharmacy*   Lab Work: BMP to be drawn today. If you have labs (blood work) drawn today and your tests are completely normal, you will receive your results only by: Marland Kitchen MyChart Message (if you have MyChart) OR . A paper copy in the mail If you have any lab test that is abnormal or we need to change your treatment, we will call you to review the results.   Testing/Procedures: None Ordered   Follow-Up: At [facility], you and your health needs are our priority.  As part of our continuing mission to provide you with exceptional heart care, we have created designated Provider Care Teams.  These Care Teams include your primary Cardiologist (physician) and Advanced Practice Providers (APPs -  Physician Assistants and Nurse Practitioners) who all work together to provide you with the care you need, when you need it.  We recommend signing up for the patient portal called "MyChart".  Sign up information is provided on this After Visit Summary.  MyChart is used to connect with patients for Virtual Visits (Telemedicine).  Patients are able to view lab/test results, encounter notes, upcoming appointments, etc.  Non-urgent messages can be sent to your provider as well.   To learn more about what you can do with MyChart, go to [email or web address].    Your next appointment:   As scheduled on 3/21  The format for your next appointment:   In Person  Provider:   Debbe Odea, MD   Other Instructions

## 2020-03-03 NOTE — Progress Notes (Signed)
Cardiology Office Note:    Date:  03/03/2020   ID:  [name], DOB Nov 12, 1965, MRN [id number]  PCP:  Ladell Pier, MD  Cardiologist:  Kate Sable, MD  Electrophysiologist:  None   Referring MD: Ladell Pier, MD   Chief Complaint  Patient presents with  . Other    Chest pain. Meds reviewed verbally with pt.    History of Present Illness:    Jennifer Miller is a 54 y.o. female with a hx of hypertension, paroxysmal atrial fibrillation on Pradaxa, diabetes,who presents due to chest pain. Patient states having an episode of right-sided chest pain yesterday while at home. Moving her right arm made pain worse. Her right chest was also slightly tender to palpation. She took her blood pressure medications and the pain eased off. She still has mild chest discomfort with palpation today.  She states taking 50 mg of HCTZ for the past month now. Her blood pressures have been well controlled on this dose. Denies any other symptoms.   Historical notes Patient was diagnosed with congestive heart failure about 19 years ago after having a baby.  She was fine before.  She recently moved to the area from [place].  She was placed on medications including Metroprolol tartrate, lisinopril, Lasix 40 mg every day.  She denies edema, orthopnea.  About 6 years ago, she states eating 3 hot dogs and drinking some soda while at her mother's home.  She later on noticed her heart was beating fast and irregular.  This prompted her to go to the ED.  She was evaluated and told she was in atrial fibrillation.  She was started on anticoagulation which she has taken since, currently takes Pradaxa twice daily.  She is a former smoker, denies any history of MI.  Amlodipine was stopped due to edema.  Past Medical History:  Diagnosis Date  . Congestive heart failure (CHF) ([place])   . Diabetes mellitus without complication ([place])   . HLD (hyperlipidemia)   . Hypertension   . Paroxysmal A-fib [facility])      Past Surgical History:  Procedure Laterality Date  . DILATION AND CURETTAGE OF UTERUS    . KNEE ARTHROSCOPY AND ARTHROTOMY      Current Medications: Current Meds  Medication Sig  . albuterol (VENTOLIN HFA) 108 (90 Base) MCG/ACT inhaler Inhale 1-2 puffs into the lungs every 6 (six) hours as needed for wheezing or shortness of breath.  Marland Kitchen atorvastatin (LIPITOR) 20 MG tablet Take 1 tablet (20 mg total) by mouth daily.  . benzonatate (TESSALON PERLES) 100 MG capsule Take 1 capsule (100 mg total) by mouth 3 (three) times daily as needed.  . Blood Glucose Monitoring Suppl (TRUE METRIX METER) w/Device KIT Use as directed  . dabigatran (PRADAXA) 150 MG CAPS capsule TAKE 1 CAPSULE(150 MG) BY MOUTH TWICE DAILY  . fluticasone (FLONASE) 50 MCG/ACT nasal spray Place 1 spray into both nostrils daily.  Marland Kitchen glucose blood (TRUE METRIX BLOOD GLUCOSE TEST) test strip Check blood sugar fasting and before meals and again if pt feels bad (symptoms of hypo).  . hydrochlorothiazide (HYDRODIURIL) 50 MG tablet Take 1 tablet (50 mg total) by mouth daily.  Marland Kitchen levothyroxine (SYNTHROID) 100 MCG tablet TAKE 1/2 TABLET BY MOUTH DAILY BEFORE BREAKFAST  . lisinopril (ZESTRIL) 40 MG tablet Take 1 tablet (40 mg total) by mouth daily.  Marland Kitchen loratadine (CLARITIN) 10 MG tablet Take 1 tablet (10 mg total) by mouth daily as needed for allergies.  . meloxicam (MOBIC) 15  MG tablet Take 1 tablet (15 mg total) by mouth daily.  . metFORMIN (GLUCOPHAGE) 500 MG tablet Take 1 tablet (500 mg total) by mouth daily with breakfast. E11.9 Diabetes type 2 controlled  . metoprolol tartrate (LOPRESSOR) 50 MG tablet Take 1 tablet (50 mg total) by mouth 2 (two) times daily.  . Multiple Vitamins-Minerals (MULTIVITAMIN WITH MINERALS) tablet Take 1 tablet by mouth daily.  Marland Kitchen PAZEO 0.7 % SOLN as needed.   . TRUEplus Lancets 28G MISC Check blood sugar fasting and before meals and again if pt feels bad (symptoms of hypo).  Marland Kitchen VITAMIN D, CHOLECALCIFEROL, PO  Take by mouth.  . [DISCONTINUED] hydrochlorothiazide (HYDRODIURIL) 50 MG tablet Take 50 mg by mouth daily.     Allergies:   Patient has no known allergies.   Social History   Socioeconomic History  . Marital status: Single    Spouse name: Not on file  . Number of children: 3  . Years of education: Not on file  . Highest education level: Not on file  Occupational History  . Occupation: unemployed  Tobacco Use  . Smoking status: Former Research scientist (life sciences)  . Smokeless tobacco: Never Used  Vaping Use  . Vaping Use: Never used  Substance and Sexual Activity  . Alcohol use: Yes    Comment: rarely  . Drug use: Never  . Sexual activity: Yes    Birth control/protection: None  Other Topics Concern  . Not on file  Social History Narrative  . Not on file   Social Determinants of Health   Financial Resource Strain:   . Difficulty of Paying Living Expenses: Not on file  Food Insecurity:   . Worried About Charity fundraiser in the Last Year: Not on file  . Ran Out of Food in the Last Year: Not on file  Transportation Needs:   . Lack of Transportation (Medical): Not on file  . Lack of Transportation (Non-Medical): Not on file  Physical Activity:   . Days of Exercise per Week: Not on file  . Minutes of Exercise per Session: Not on file  Stress:   . Feeling of Stress : Not on file  Social Connections:   . Frequency of Communication with Friends and Family: Not on file  . Frequency of Social Gatherings with Friends and Family: Not on file  . Attends Religious Services: Not on file  . Active Member of Clubs or Organizations: Not on file  . Attends Archivist Meetings: Not on file  . Marital Status: Not on file     Family History: The patient's family history includes Dementia in her mother; Diabetes in her father, maternal grandmother, mother, and paternal grandmother; Heart disease in her paternal grandmother; Hypertension in her father and mother.  ROS:   Please see the  history of present illness.     All other systems reviewed and are negative.  EKGs/Labs/Other Studies Reviewed:    The following studies were reviewed today:   EKG:  EKG is ordered today. EKG shows normal sinus rhythm.  Recent Labs: 07/25/2019: TSH 2.330 09/29/2019: ALT 31; B Natriuretic Peptide 12.6; BUN 7; Creatinine, Ser 0.78; Hemoglobin 13.8; Platelets 280; Potassium 4.0; Sodium 137  Recent Lipid Panel    Component Value Date/Time   CHOL 134 03/23/2019 1643   TRIG 72 03/23/2019 1643   HDL 43 03/23/2019 1643   CHOLHDL 3.1 03/23/2019 1643   LDLCALC 76 03/23/2019 1643    Physical Exam:    VS:  BP 120/72 (BP  Location: Left Arm, Patient Position: Sitting, Cuff Size: Large)   Pulse 72   Ht '5\' 7"'  (1.702 m)   Wt 259 lb (117.5 kg)   SpO2 98%   BMI 40.57 kg/m     Wt Readings from Last 3 Encounters:  03/03/20 259 lb (117.5 kg)  01/17/20 259 lb 9.6 oz (117.8 kg)  11/09/19 250 lb 4 oz (113.5 kg)     GEN:  Well nourished, well developed in no acute distress HEENT: Normal NECK: No JVD; No carotid bruits LYMPHATICS: No lymphadenopathy CARDIAC: RRR, no murmurs, rubs, gallops RESPIRATORY:  Clear to auscultation without rales, wheezing or rhonchi  ABDOMEN: Soft, non-tender, non-distended, obese MUSCULOSKELETAL: no edema; mild right-sided chest tenderness on palpation SKIN: Warm and dry NEUROLOGIC:  Alert and oriented x 3 PSYCHIATRIC:  Normal affect   ASSESSMENT:    1. Chest pain of uncertain etiology   2. Essential hypertension   3. PAF (paroxysmal atrial fibrillation) ([place])   4. Pure hypercholesterolemia    PLAN:    In order of problems listed above;  1. Patient with chest pain, symptoms appear noncardiac. Likely musculoskeletal with discomfort on palpation of right chest muscles. No additional cardiac testing indicated. Patient reassured. 2. Patient with history of uncontrolled hypertension.  Blood pressure controlled today.  Continue HCTZ 50 mg daily,  lisinopril 40  mg daily, Lopressor 50 mg twice daily. Will obtain BMP to evaluate potassium and creatinine due to taking HCTZ 50 mg daily.  3. History of paroxysmal A. fib, currently in sinus rhythm. Lopressor, Pradaxa. 4. History of hyperlipidemia, continue Lipitor.  Follow-up in 5-6 months  Total encounter time 40 minutes  Greater than 50% was spent in counseling and coordination of care with the patient  Medication Adjustments/Labs and Tests Ordered: Current medicines are reviewed at length with the patient today.  Concerns regarding medicines are outlined above.  Orders Placed This Encounter  Procedures  . Basic metabolic panel  . EKG 12-Lead   Meds ordered this encounter  Medications  . hydrochlorothiazide (HYDRODIURIL) 50 MG tablet    Sig: Take 1 tablet (50 mg total) by mouth daily.    Dispense:  30 tablet    Refill:  5    Patient Instructions  Medication Instructions:  Your physician recommends that you continue on your current medications as directed. Please refer to the Current Medication list given to you today.  *If you need a refill on your cardiac medications before your next appointment, please call your pharmacy*   Lab Work: BMP to be drawn today. If you have labs (blood work) drawn today and your tests are completely normal, you will receive your results only by: Marland Kitchen MyChart Message (if you have MyChart) OR . A paper copy in the mail If you have any lab test that is abnormal or we need to change your treatment, we will call you to review the results.   Testing/Procedures: None Ordered   Follow-Up: At [facility], you and your health needs are our priority.  As part of our continuing mission to provide you with exceptional heart care, we have created designated Provider Care Teams.  These Care Teams include your primary Cardiologist (physician) and Advanced Practice Providers (APPs -  Physician Assistants and Nurse Practitioners) who all work together to provide you with  the care you need, when you need it.  We recommend signing up for the patient portal called "MyChart".  Sign up information is provided on this After Visit Summary.  MyChart is used  to connect with patients for Virtual Visits (Telemedicine).  Patients are able to view lab/test results, encounter notes, upcoming appointments, etc.  Non-urgent messages can be sent to your provider as well.   To learn more about what you can do with MyChart, go to [email or web address].    Your next appointment:   As scheduled on 3/21  The format for your next appointment:   In Person  Provider:   Kate Sable, MD   Other Instructions      Signed, Kate Sable, MD  03/03/2020 5:06 PM    [place]

## 2020-03-14 ENCOUNTER — Other Ambulatory Visit: Payer: Self-pay | Admitting: Podiatry

## 2020-03-14 NOTE — Telephone Encounter (Signed)
Please advise 

## 2020-04-21 ENCOUNTER — Ambulatory Visit: Payer: Medicaid Other | Admitting: Podiatry

## 2020-04-22 ENCOUNTER — Telehealth: Payer: Self-pay | Admitting: Cardiology

## 2020-04-22 ENCOUNTER — Other Ambulatory Visit: Payer: Self-pay | Admitting: Physician Assistant

## 2020-04-22 DIAGNOSIS — I480 Paroxysmal atrial fibrillation: Secondary | ICD-10-CM

## 2020-04-22 MED ORDER — HYDROCHLOROTHIAZIDE 50 MG PO TABS: 50.0000 mg | ORAL_TABLET | Freq: Every day | ORAL | 1 refills | Status: DC

## 2020-04-22 MED ORDER — DABIGATRAN ETEXILATE MESYLATE 150 MG PO CAPS: ORAL_CAPSULE | ORAL | 1 refills | Status: DC

## 2020-04-22 NOTE — Telephone Encounter (Signed)
°*  STAT* If patient is at the pharmacy, call can be transferred to refill team.   1. Which medications need to be refilled? (please list name of each medication and dose if known)   pradaxa 150 mg po BID hctz 50 mg po q d    2. Which pharmacy/location (including street and city if local pharmacy) is medication to be sent to?  [facility] near [place]   3. Do they need a 30 day or 90 day supply?  90

## 2020-04-22 NOTE — Telephone Encounter (Signed)
Requested Prescriptions   Signed Prescriptions Disp Refills   dabigatran (PRADAXA) 150 MG CAPS capsule 180 capsule 1    Sig: TAKE 1 CAPSULE(150 MG) BY MOUTH TWICE DAILY    Authorizing Provider: Debbe Odea    Ordering User: Jemel Ono C   hydrochlorothiazide (HYDRODIURIL) 50 MG tablet 90 tablet 1    Sig: Take 1 tablet (50 mg total) by mouth daily.    Authorizing Provider: Debbe Odea    Ordering User: Kendrick Fries

## 2020-05-01 ENCOUNTER — Telehealth: Payer: Self-pay | Admitting: Cardiology

## 2020-05-01 ENCOUNTER — Ambulatory Visit: Payer: Medicaid Other | Admitting: Podiatry

## 2020-05-01 MED ORDER — AMLODIPINE BESYLATE 5 MG PO TABS
5.0000 mg | ORAL_TABLET | Freq: Every day | ORAL | 5 refills | Status: DC
Start: 2020-05-01 — End: 2020-08-11

## 2020-05-01 NOTE — Telephone Encounter (Signed)
Spoke with patient and gave Dr. Merita Norton recommendations as copied and pasted below:  Debbe Odea, MD  You 1 hour ago (1:48 PM)   Start amlodipine 5 mg daily for additional blood pressure benefit. Keep follow-up appointment as scheduled. Please advise patient on low-salt diet. Thank you    Patient verbalized understanding and agreed with plan.

## 2020-05-01 NOTE — Telephone Encounter (Signed)
Pt c/o BP issue: STAT if pt c/o blurred vision, one-sided weakness or slurred speech  1. What are your last 5 BP readings?  Yesterday 159/98 Today 160/99  2. Are you having any other symptoms (ex. Dizziness, headache, blurred vision, passed out)? Headache on the right side of her head  3. What is your BP issue? Elevated

## 2020-05-08 ENCOUNTER — Other Ambulatory Visit: Payer: Self-pay

## 2020-05-08 ENCOUNTER — Encounter: Payer: Self-pay | Admitting: Podiatry

## 2020-05-08 ENCOUNTER — Ambulatory Visit (INDEPENDENT_AMBULATORY_CARE_PROVIDER_SITE_OTHER): Payer: Medicaid Other | Admitting: Podiatry

## 2020-05-08 DIAGNOSIS — M79675 Pain in left toe(s): Secondary | ICD-10-CM | POA: Diagnosis not present

## 2020-05-08 DIAGNOSIS — M79674 Pain in right toe(s): Secondary | ICD-10-CM

## 2020-05-08 DIAGNOSIS — E119 Type 2 diabetes mellitus without complications: Secondary | ICD-10-CM | POA: Diagnosis not present

## 2020-05-08 DIAGNOSIS — B351 Tinea unguium: Secondary | ICD-10-CM | POA: Diagnosis not present

## 2020-05-08 NOTE — Progress Notes (Signed)
This patient returns to my office for at risk foot care.  This patient requires this care by a professional since this patient will be at risk due to having  diabetes.    This patient is unable to cut nails herself since the patient cannot reach her nails.These nails are painful walking and wearing shoes.  This patient presents for at risk foot care today.  General Appearance  Alert, conversant and in no acute stress.  Vascular  Dorsalis pedis and posterior tibial  pulses are palpable  bilaterally.  Capillary return is within normal limits  bilaterally. Temperature is within normal limits  bilaterally.  Neurologic  Senn-Weinstein monofilament wire test within normal limits  bilaterally. Muscle power within normal limits bilaterally.  Nails Thick disfigured discolored nails with subungual debris  from hallux to fifth toes bilaterally. No evidence of bacterial infection or drainage bilaterally.  Orthopedic  No limitations of motion  feet .  No crepitus or effusions noted.  No bony pathology or digital deformities noted.  Skin  normotropic skin with no porokeratosis noted bilaterally.  No signs of infections or ulcers noted.   Right heel callus medially.  Onychomycosis  Pain in right toes  Pain in left toes  Consent was obtained for treatment procedures.   Mechanical debridement of nails 1-5  bilaterally performed with a nail nipper.  No dremel  usage since there was nail polish on her nails. Patient is under treatment of plantar fasciitis by Dr.  Evans.   Return office visit   3 months             Told patient to return for periodic foot care and evaluation due to potential at risk complications.   Aysen Shieh DPM  

## 2020-06-27 IMAGING — MG MM DIGITAL SCREENING BILAT W/ TOMO W/ CAD
8 of 14 series · 8 of 40 positions shown · non-contrast
Comparison: Previous exam(s).

ACR Breast Density Category a: The breast tissue is almost entirely
fatty.

CLINICAL DATA: Screening.

EXAM:
DIGITAL SCREENING BILATERAL MAMMOGRAM WITH TOMO AND CAD

[L CC synth-2D (1 of 2)]
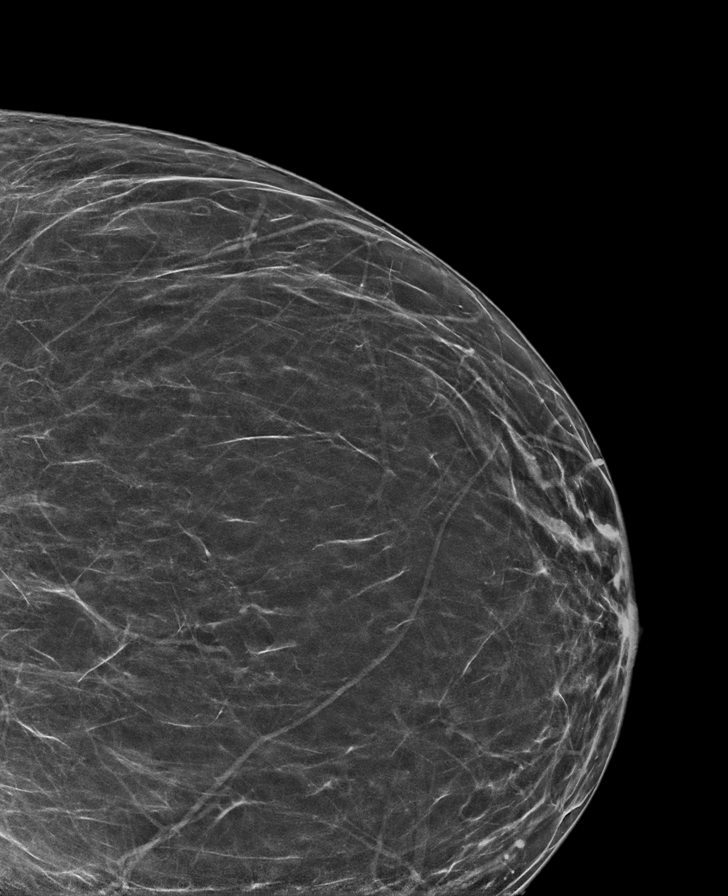

[R MLO synth-2D (1 of 2)]
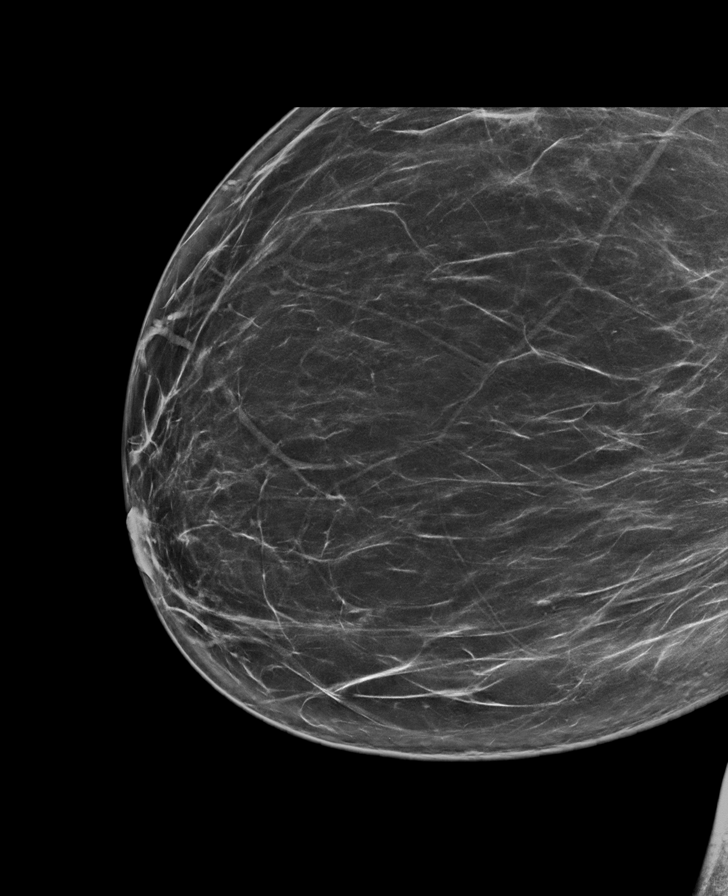

[L MLO synth-2D (1 of 2)]
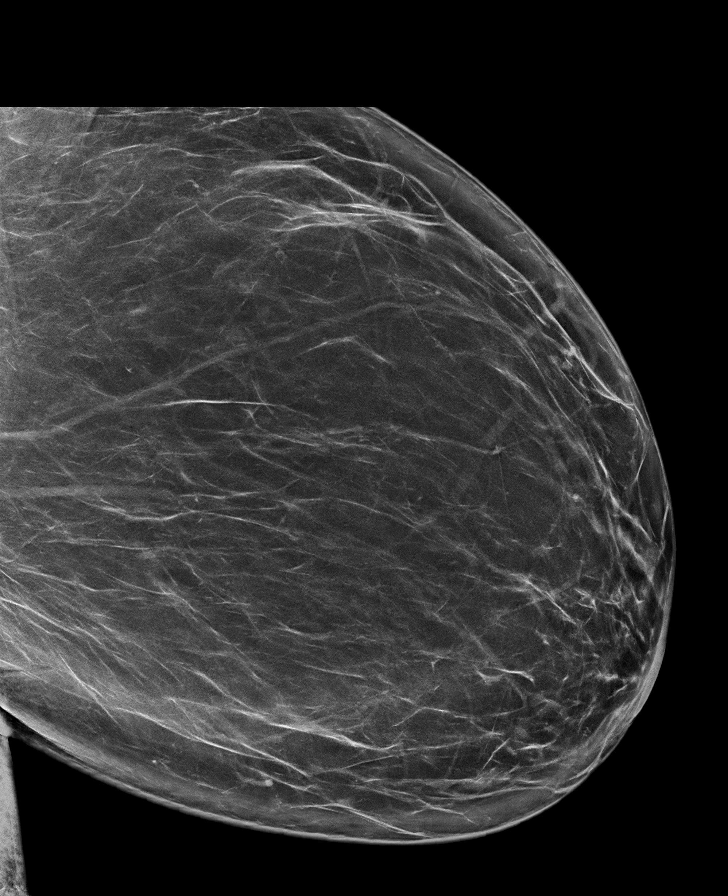

[R CC synth-2D]
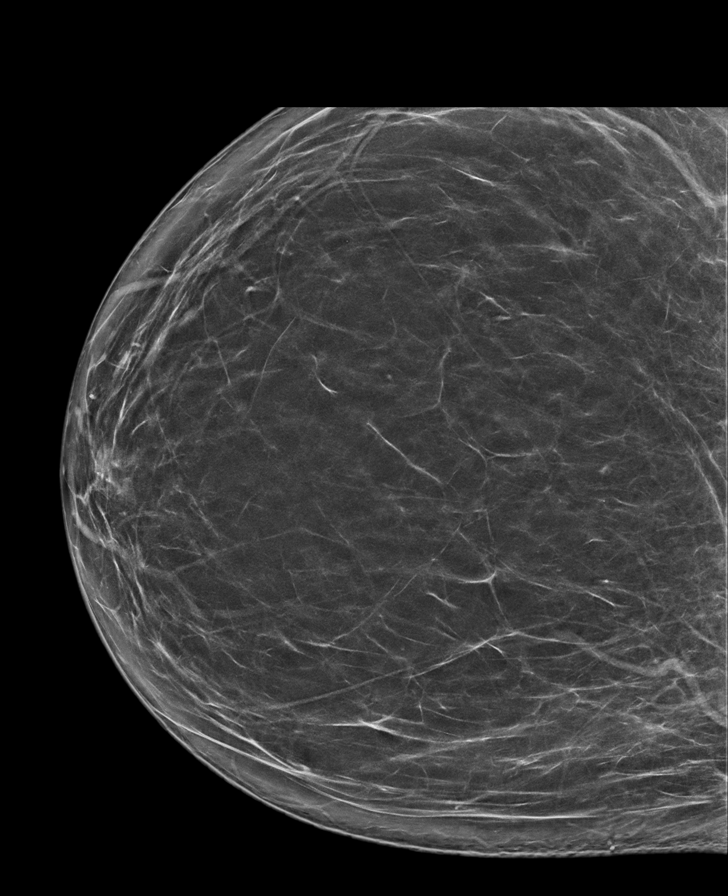

[L CC synth-2D (2 of 2)]
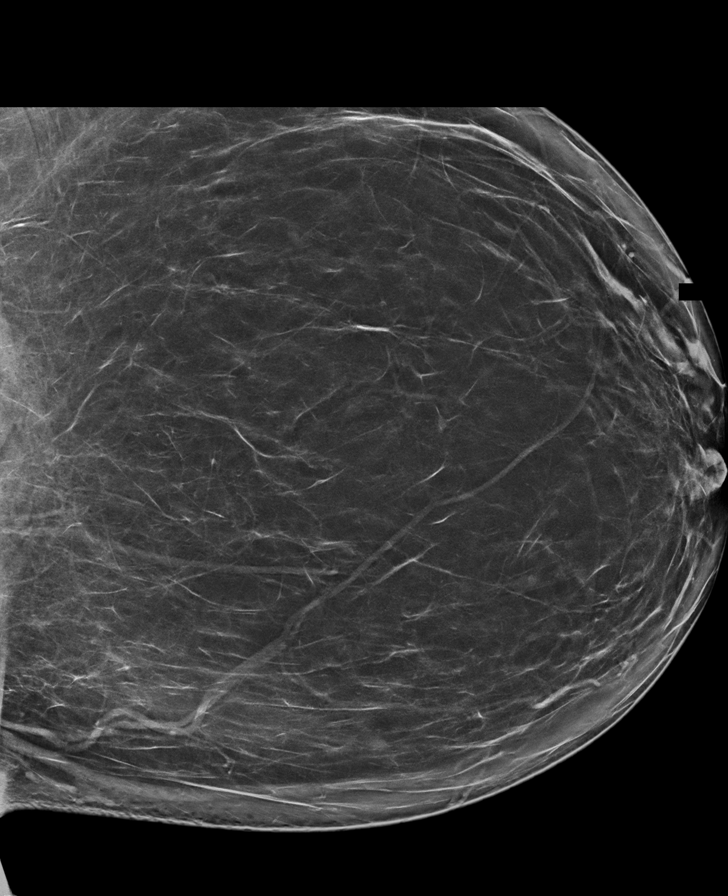

[L MLO synth-2D (2 of 2)]
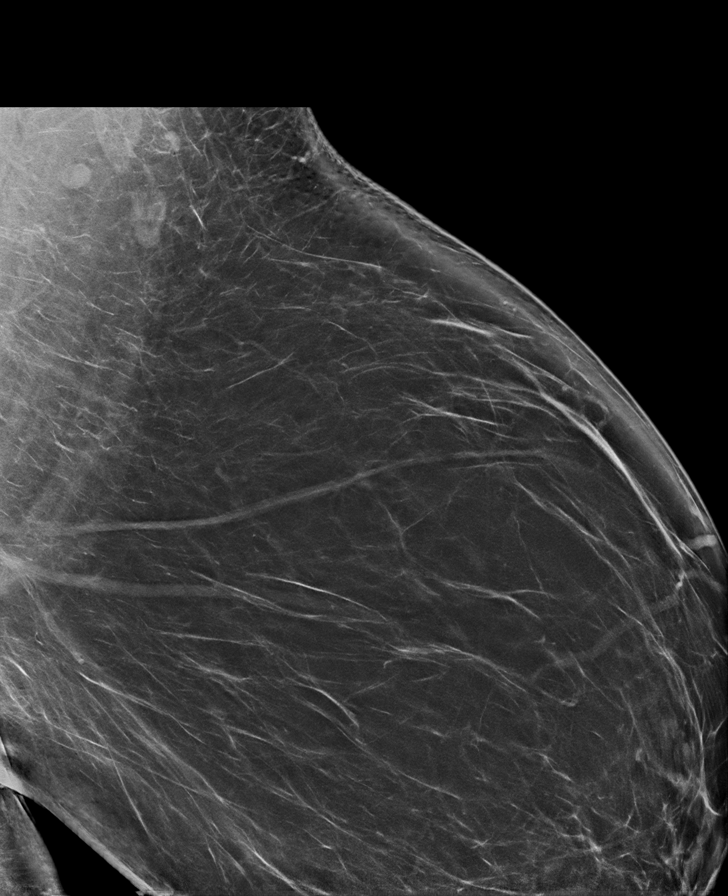

[R MLO synth-2D (2 of 2)]
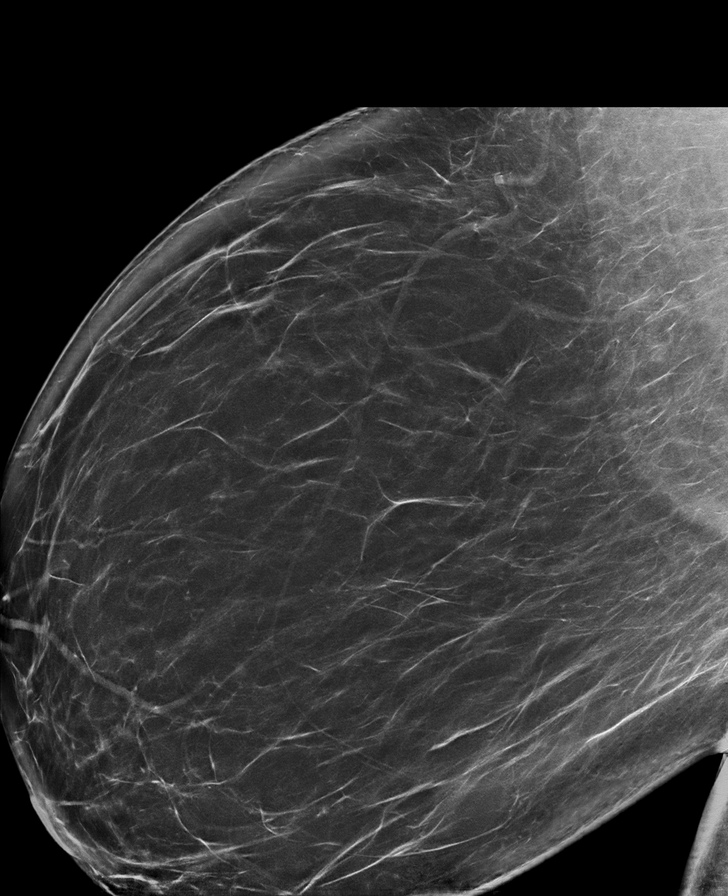

[L CC tomo · tomo slice 48/95.0]
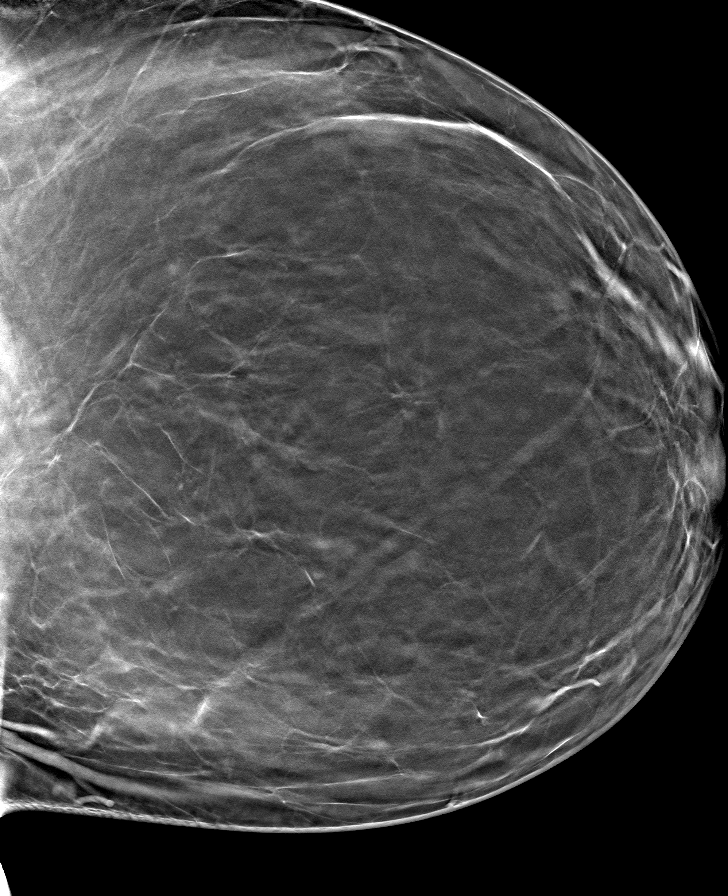

[8 of 40 positions shown; findings below may reference images not displayed]

FINDINGS: There are no findings suspicious for malignancy. Images were
processed with CAD.
IMPRESSION: No mammographic evidence of malignancy. A result letter of this
screening mammogram will be mailed directly to the patient.

RECOMMENDATION:
Screening mammogram in one year. (Code:8Y-[id number])

BI-RADS CATEGORY  1: Negative.

## 2020-07-14 ENCOUNTER — Other Ambulatory Visit: Payer: Self-pay | Admitting: *Deleted

## 2020-07-14 MED ORDER — METOPROLOL TARTRATE 50 MG PO TABS: 50.0000 mg | ORAL_TABLET | Freq: Two times a day (BID) | ORAL | 0 refills | Status: DC

## 2020-07-21 ENCOUNTER — Ambulatory Visit: Payer: Medicaid Other | Admitting: Cardiology

## 2020-07-26 IMAGING — CR DG RIBS W/ CHEST 3+V*L*
1 series · 3 of 3 positions shown · non-contrast
Comparison: Chest radiograph 07/03/2018

CLINICAL DATA: Left rib pain and cough, hypertension, HLD,
diabetes, CHF

EXAM:
LEFT RIBS AND CHEST - 3+ VIEW

[Series 1: dg ribs unilateral w/chest left · 0.14mm/px · 3 of 3 slices shown]
[im 1/3]
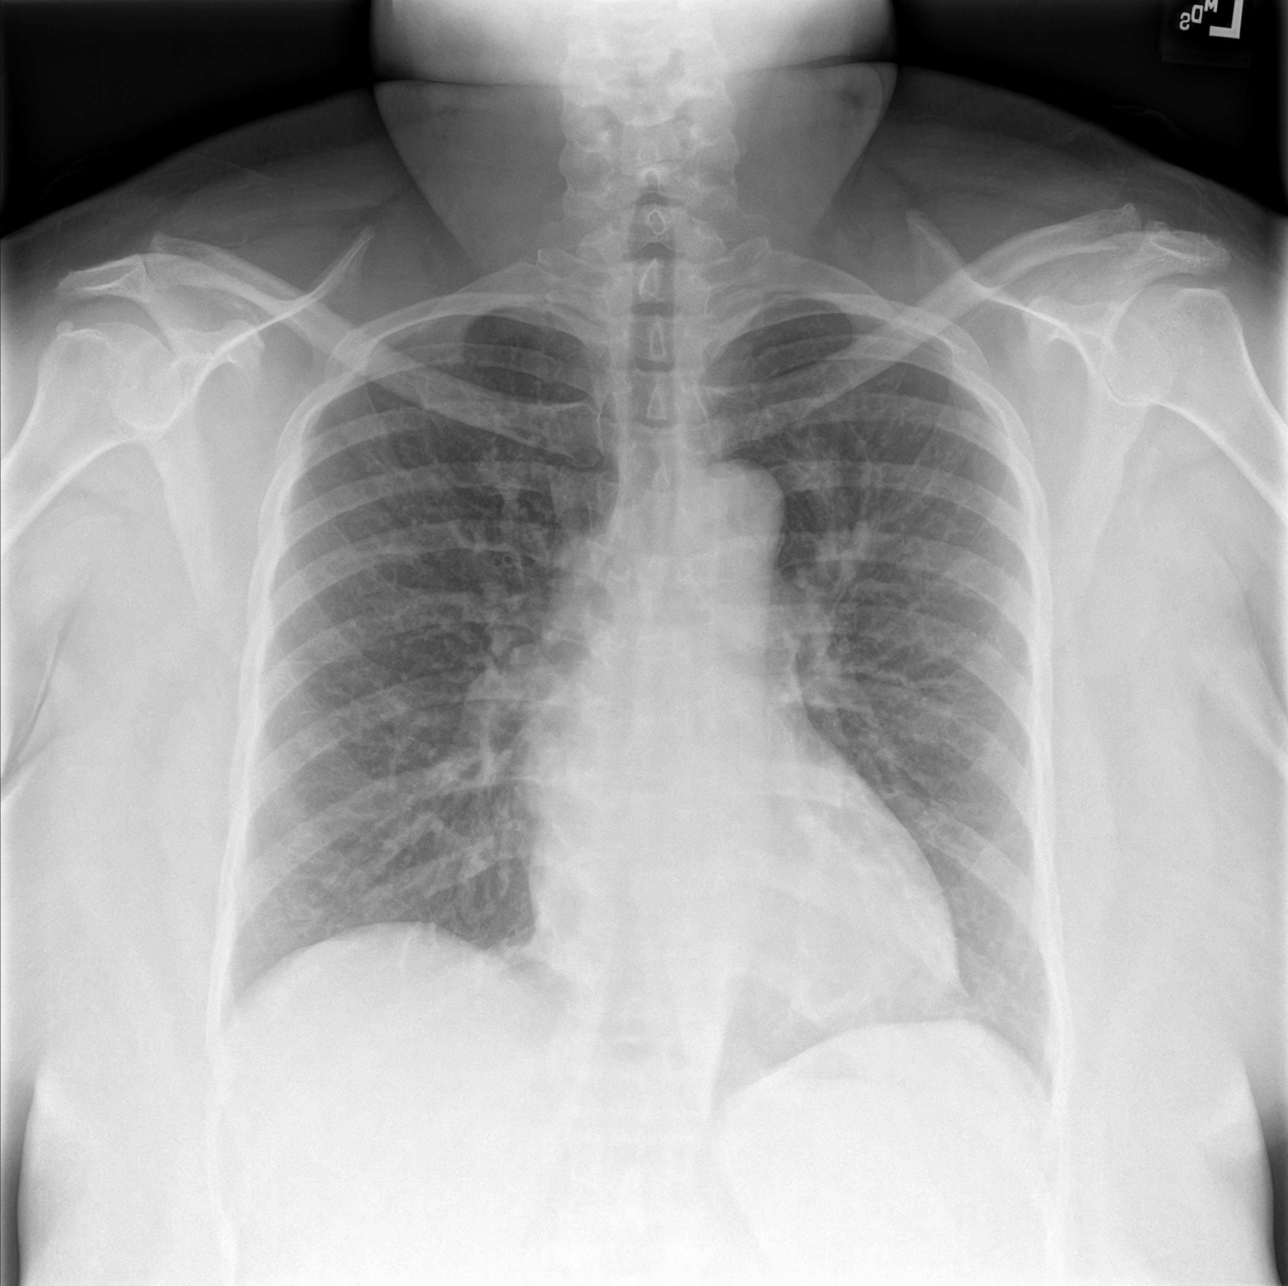
[im 2/3]
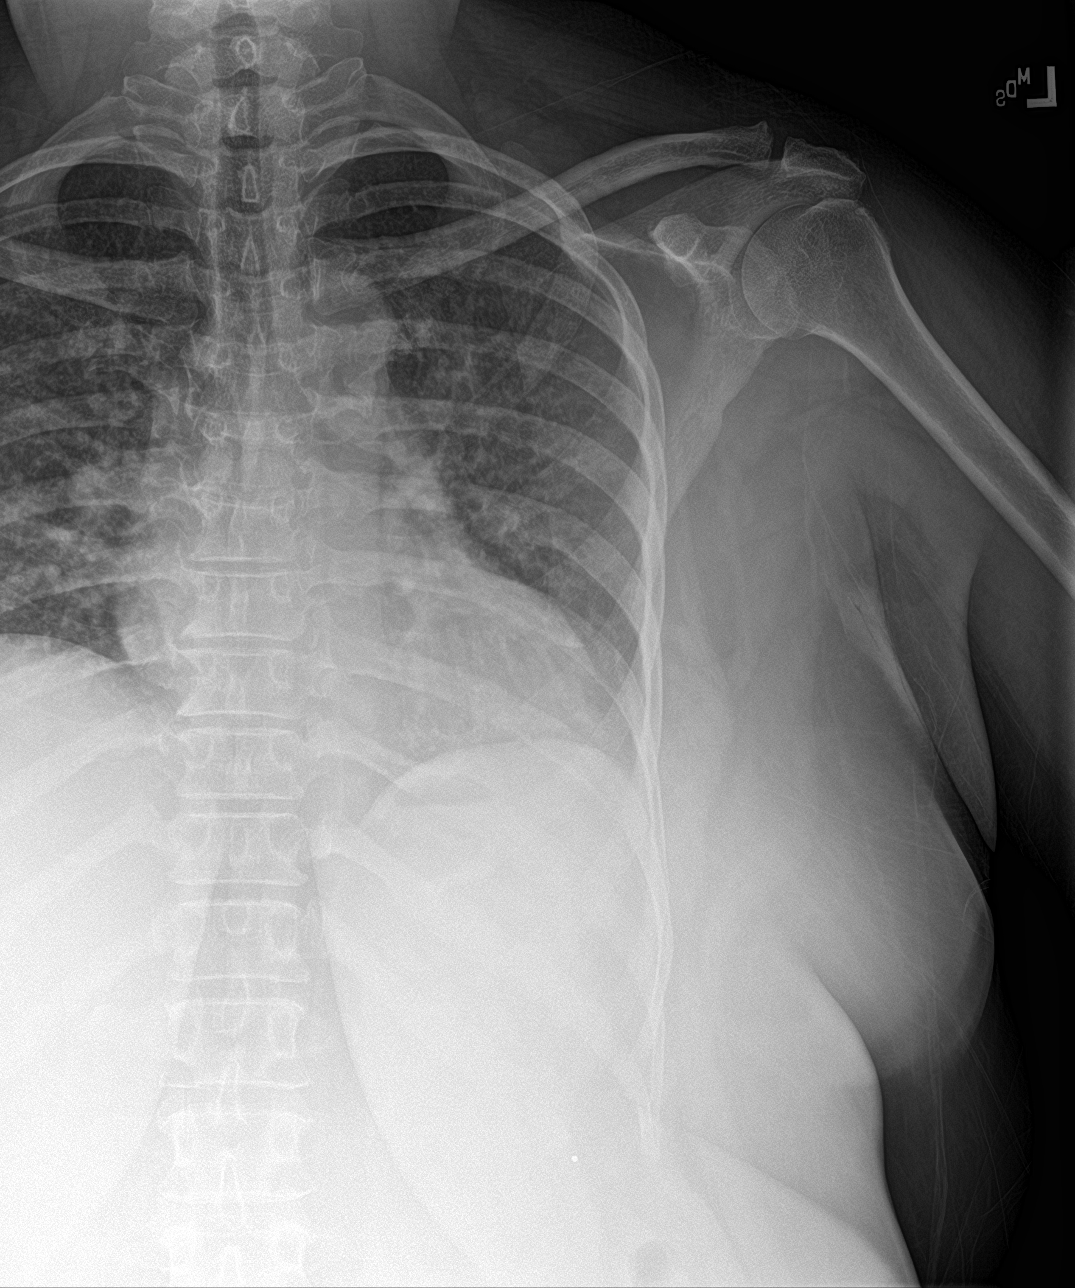
[im 3/3]
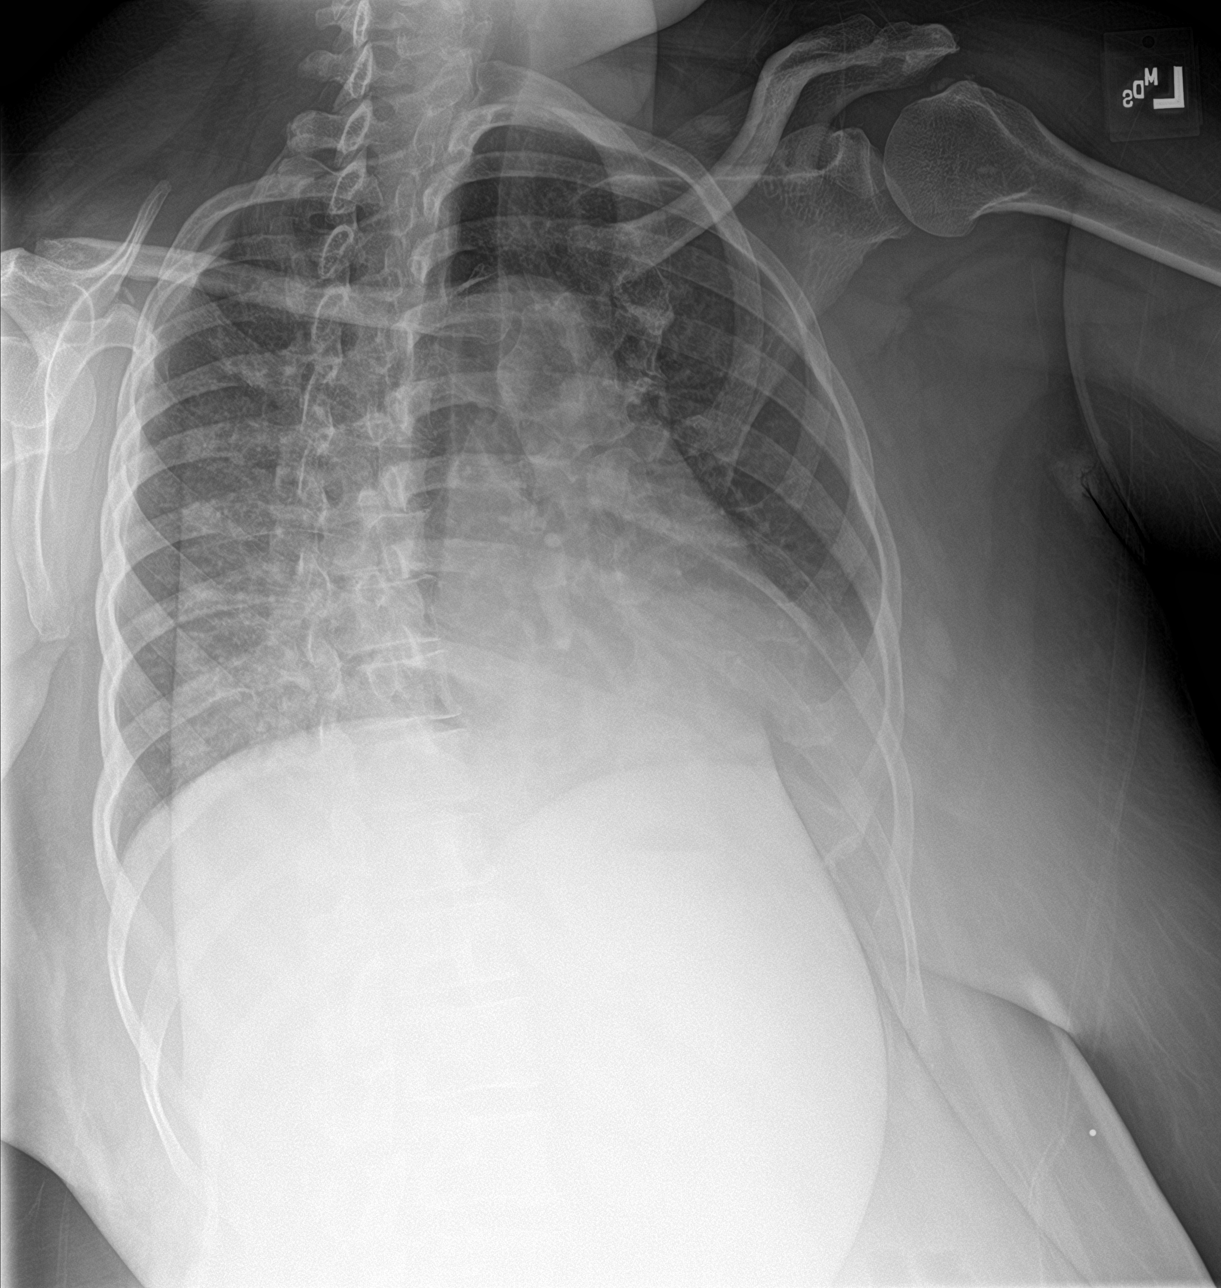

[3 of 3 positions shown; findings below may reference images not displayed]

FINDINGS: Stable cardiomediastinal contours with normal heart size. There is
no focal pulmonary opacity. No pneumothorax or large pleural
effusion. No evidence of a left-sided displaced rib fracture.
IMPRESSION: 1. No active cardiopulmonary disease.
2. No evidence of a left-sided displaced rib fracture.

## 2020-07-28 ENCOUNTER — Other Ambulatory Visit: Payer: Self-pay | Admitting: Cardiology

## 2020-07-28 DIAGNOSIS — I480 Paroxysmal atrial fibrillation: Secondary | ICD-10-CM

## 2020-07-28 MED ORDER — DABIGATRAN ETEXILATE MESYLATE 150 MG PO CAPS: ORAL_CAPSULE | ORAL | 1 refills | Status: DC

## 2020-07-28 NOTE — Telephone Encounter (Signed)
Prescription refill request received for Pradaxa,   Scr: 0.71, 03/03/2020 Lov: 03/03/2020 Weight: 117.5 kg  Age: 55 yo  CrCl:  103 ml/min   Pt is on the correct dose of Pradaxa per dosing criteria, prescription refill sent for Pradaxa 150mg  BID.

## 2020-08-07 ENCOUNTER — Ambulatory Visit (INDEPENDENT_AMBULATORY_CARE_PROVIDER_SITE_OTHER): Payer: Medicaid Other | Admitting: Podiatry

## 2020-08-07 ENCOUNTER — Other Ambulatory Visit: Payer: Self-pay

## 2020-08-07 ENCOUNTER — Encounter: Payer: Self-pay | Admitting: Podiatry

## 2020-08-07 DIAGNOSIS — E119 Type 2 diabetes mellitus without complications: Secondary | ICD-10-CM

## 2020-08-07 DIAGNOSIS — M79674 Pain in right toe(s): Secondary | ICD-10-CM

## 2020-08-07 DIAGNOSIS — B351 Tinea unguium: Secondary | ICD-10-CM

## 2020-08-07 DIAGNOSIS — M79675 Pain in left toe(s): Secondary | ICD-10-CM | POA: Diagnosis not present

## 2020-08-07 NOTE — Progress Notes (Signed)
This patient returns to my office for at risk foot care.  This patient requires this care by a professional since this patient will be at risk due to having  diabetes.    This patient is unable to cut nails herself since the patient cannot reach her nails.These nails are painful walking and wearing shoes.  This patient presents for at risk foot care today.  General Appearance  Alert, conversant and in no acute stress.  Vascular  Dorsalis pedis and posterior tibial  pulses are palpable  bilaterally.  Capillary return is within normal limits  bilaterally. Temperature is within normal limits  bilaterally.  Neurologic  Senn-Weinstein monofilament wire test within normal limits  bilaterally. Muscle power within normal limits bilaterally.  Nails Thick disfigured discolored nails with subungual debris  from hallux to fifth toes bilaterally. No evidence of bacterial infection or drainage bilaterally.  Orthopedic  No limitations of motion  feet .  No crepitus or effusions noted.  No bony pathology or digital deformities noted.  Skin  normotropic skin with no porokeratosis noted bilaterally.  No signs of infections or ulcers noted.   Right heel callus medially.  Onychomycosis  Pain in right toes  Pain in left toes  Consent was obtained for treatment procedures.   Mechanical debridement of nails 1-5  bilaterally performed with a nail nipper.  No dremel  usage since there was nail polish on her nails. Patient is under treatment of plantar fasciitis by Dr.  Logan Bores.   Return office visit   3 months             Told patient to return for periodic foot care and evaluation due to potential at risk complications.   Helane Gunther DPM

## 2020-08-11 ENCOUNTER — Telehealth: Payer: Self-pay | Admitting: Internal Medicine

## 2020-08-11 ENCOUNTER — Ambulatory Visit: Payer: Medicaid Other | Attending: Internal Medicine | Admitting: Internal Medicine

## 2020-08-11 ENCOUNTER — Other Ambulatory Visit: Payer: Self-pay

## 2020-08-11 DIAGNOSIS — J069 Acute upper respiratory infection, unspecified: Secondary | ICD-10-CM | POA: Diagnosis not present

## 2020-08-11 DIAGNOSIS — E039 Hypothyroidism, unspecified: Secondary | ICD-10-CM

## 2020-08-11 DIAGNOSIS — I10 Essential (primary) hypertension: Secondary | ICD-10-CM

## 2020-08-11 DIAGNOSIS — E669 Obesity, unspecified: Secondary | ICD-10-CM | POA: Diagnosis not present

## 2020-08-11 DIAGNOSIS — E1169 Type 2 diabetes mellitus with other specified complication: Secondary | ICD-10-CM | POA: Diagnosis not present

## 2020-08-11 DIAGNOSIS — E785 Hyperlipidemia, unspecified: Secondary | ICD-10-CM | POA: Diagnosis not present

## 2020-08-11 MED ORDER — ATORVASTATIN CALCIUM 20 MG PO TABS: 20.0000 mg | ORAL_TABLET | Freq: Every day | ORAL | 6 refills | Status: DC

## 2020-08-11 MED ORDER — METFORMIN HCL 500 MG PO TABS: 500.0000 mg | ORAL_TABLET | Freq: Every day | ORAL | 3 refills | Status: DC

## 2020-08-11 NOTE — Progress Notes (Signed)
Virtual Visit via Telephone Note  I connected with Jennifer Miller on 08/11/2020 at 3:27 p.m by telephone and verified that I am speaking with the correct person using two identifiers  Location: Patient: home Provider: office  Participants: Myself Patient CMA: Ms. Jennifer Miller    I discussed the limitations, risks, security and privacy concerns of performing an evaluation and management service by telephone and the availability of in person appointments. I also discussed with the patient that there may be a patient responsible charge related to this service. The patient expressed understanding and agreed to proceed.   History of Present Illness: Pt with hx ofCHF, a.fib, HL,HTN, hypothyroid, IDAwith hx of fibroid tumors, anxietyand DM.  Last seen 09/2019  DM: not checking BS Out of Metformin x several mths and missed last appt Trying to eat healthy.  Not exercising at all  HYPERTENSION/CHF/a.fib Currently taking: see medication list.  She confirms taking lisinopril, metoprolol, hydrochlorothiazide and Pradaxa. Med Adherence: Norvasc d/c by heart doc because leg swelling and burning.  Taking other meds.  Taking Pradaxia Medication side effects: '[]'  Yes    '[x]'  No Adherence with salt restriction: '[x]'  Yes    '[]'  No Home Monitoring?: '[x]'  Yes    '[]'  No Monitoring Frequency:  occasionally Home BP results range:  Reports BP has been good SOB? '[]'  Yes    '[x]'  No Chest Pain?: '[]'  Yes    '[x]'  No Leg swelling?: '[]'  Yes    '[x]'  No Headaches?: '[]'  Yes    '[x]'  No Dizziness? '[]'  Yes    '[x]'  No Comments: No bruising or bleeding.  Hypothyroid: reports compliance with Levothyroxine  C/o having cough for past few days.  Thinks she has a cold Thinks she had fever 3 days ago for which she took Tylenol No loss taste/smell, HA, no sore throat, no SOB. +postnasal drip.  She had COVID infection last yr and receive MAB Grandson has a cold currently. She has not had COVID vaccine and does not plan to get it or flu  shot She has a home COVID test but has not check  Outpatient Encounter Medications as of 08/11/2020  Medication Sig  . albuterol (VENTOLIN HFA) 108 (90 Base) MCG/ACT inhaler Inhale 1-2 puffs into the lungs every 6 (six) hours as needed for wheezing or shortness of breath.  . dabigatran (PRADAXA) 150 MG CAPS capsule TAKE 1 CAPSULE(150 MG) BY MOUTH TWICE DAILY  . fluticasone (FLONASE) 50 MCG/ACT nasal spray Place 1 spray into both nostrils daily.  . hydrochlorothiazide (HYDRODIURIL) 50 MG tablet Take 1 tablet (50 mg total) by mouth daily.  Marland Kitchen levothyroxine (SYNTHROID) 100 MCG tablet TAKE 1/2 TABLET BY MOUTH DAILY BEFORE BREAKFAST  . lisinopril (ZESTRIL) 40 MG tablet Take 1 tablet (40 mg total) by mouth daily.  Marland Kitchen loratadine (CLARITIN) 10 MG tablet Take 1 tablet (10 mg total) by mouth daily as needed for allergies.  . metFORMIN (GLUCOPHAGE) 500 MG tablet Take 1 tablet (500 mg total) by mouth daily with breakfast. E11.9 Diabetes type 2 controlled  . metoprolol tartrate (LOPRESSOR) 50 MG tablet Take 1 tablet (50 mg total) by mouth 2 (two) times daily.  . Multiple Vitamins-Minerals (MULTIVITAMIN WITH MINERALS) tablet Take 1 tablet by mouth daily.  Marland Kitchen VITAMIN D, CHOLECALCIFEROL, PO Take by mouth.  Marland Kitchen amLODipine (NORVASC) 5 MG tablet Take 1 tablet (5 mg total) by mouth daily.  Marland Kitchen atorvastatin (LIPITOR) 20 MG tablet Take 1 tablet (20 mg total) by mouth daily. (Patient not taking: Reported on 08/11/2020)  . benzonatate (  TESSALON PERLES) 100 MG capsule Take 1 capsule (100 mg total) by mouth 3 (three) times daily as needed. (Patient not taking: Reported on 08/11/2020)  . Blood Glucose Monitoring Suppl (TRUE METRIX METER) w/Device KIT Use as directed  . glucose blood (TRUE METRIX BLOOD GLUCOSE TEST) test strip Check blood sugar fasting and before meals and again if pt feels bad (symptoms of hypo).  . meloxicam (MOBIC) 15 MG tablet TAKE 1 TABLET(15 MG) BY MOUTH DAILY (Patient not taking: Reported on 08/11/2020)  .  PAZEO 0.7 % SOLN as needed.   . TRUEplus Lancets 28G MISC Check blood sugar fasting and before meals and again if pt feels bad (symptoms of hypo).   No facility-administered encounter medications on file as of 08/11/2020.      Observations/Objective: No direct observation done as this was a telephone encounter.  Patient did not sound congested.  Assessment and Plan: 1. Type 2 diabetes mellitus with obesity ([place]) Refill given on Metformin. Discussed the importance of healthy eating habits, regular aerobic exercise (at least 150 minutes a week as tolerated) and medication compliance to achieve or maintain control of diabetes. - metFORMIN (GLUCOPHAGE) 500 MG tablet; Take 1 tablet (500 mg total) by mouth daily with breakfast. E11.9 Diabetes type 2 controlled  Dispense: 90 tablet; Refill: 3 - CBC; Future - Comprehensive metabolic panel; Future - Lipid panel; Future - Microalbumin / creatinine urine ratio; Future - Hemoglobin A1c; Future - Ambulatory referral to Ophthalmology  2. Hyperlipidemia associated with type 2 diabetes mellitus ([facility]) - atorvastatin (LIPITOR) 20 MG tablet; Take 1 tablet (20 mg total) by mouth daily.  Dispense: 30 tablet; Refill: 6  3. Essential hypertension Continue current medications and low-salt diet.  4. URI, acute Recommend that she gets her COVID test.  She plans to use the home kit that she has.  She will let me know if she test positive.  Recommend over-the-counter diabetic [place] for cough.  5. Acquired hypothyroidism Continue levothyroxine. - TSH; Future   Follow Up Instructions: 3-4 mths in person   I discussed the assessment and treatment plan with the patient. The patient was provided an opportunity to ask questions and all were answered. The patient agreed with the plan and demonstrated an understanding of the instructions.   The patient was advised to call back or seek an in-person evaluation if the symptoms worsen or if the condition fails to  improve as anticipated.  I  Spent 14 minutes on this telephone encounter  Karle Plumber, MD

## 2020-08-11 NOTE — Telephone Encounter (Signed)
Patient called to ask if her appt. Today could be a virtua appt. Because she has a cough and is not feeling well.  She did not want to come into the office sick.  Tried the office but no answer.  Please call patient to confirm if this would be ok.  CB# [phone]

## 2020-08-15 ENCOUNTER — Ambulatory Visit: Payer: Medicaid Other | Admitting: Cardiology

## 2020-08-18 ENCOUNTER — Other Ambulatory Visit: Payer: Self-pay | Admitting: *Deleted

## 2020-08-18 MED ORDER — METOPROLOL TARTRATE 50 MG PO TABS: 50.0000 mg | ORAL_TABLET | Freq: Two times a day (BID) | ORAL | 0 refills | Status: DC

## 2020-08-25 ENCOUNTER — Telehealth: Payer: Self-pay | Admitting: Internal Medicine

## 2020-08-25 NOTE — Telephone Encounter (Signed)
-----   Message from Marcine Matar, MD sent at 08/11/2020  6:09 PM EDT ----- Regarding: f/u in 3-4 mths in-person

## 2020-08-25 NOTE — Telephone Encounter (Signed)
Pt need a 3-4 month in person visit with Dr. Laural Benes. Called Pt no answer left vm to call [phone] to schedule appt.

## 2020-09-01 ENCOUNTER — Other Ambulatory Visit: Payer: Self-pay

## 2020-09-01 ENCOUNTER — Ambulatory Visit (INDEPENDENT_AMBULATORY_CARE_PROVIDER_SITE_OTHER): Payer: Medicaid Other | Admitting: Cardiology

## 2020-09-01 ENCOUNTER — Ambulatory Visit: Payer: Medicaid Other | Attending: Internal Medicine

## 2020-09-01 ENCOUNTER — Encounter: Payer: Self-pay | Admitting: Cardiology

## 2020-09-01 VITALS — BP 118/72 | HR 81 | Ht 66.0 in | Wt 266.0 lb

## 2020-09-01 DIAGNOSIS — I10 Essential (primary) hypertension: Secondary | ICD-10-CM

## 2020-09-01 DIAGNOSIS — E7800 Pure hypercholesterolemia, unspecified: Secondary | ICD-10-CM | POA: Diagnosis not present

## 2020-09-01 DIAGNOSIS — E1169 Type 2 diabetes mellitus with other specified complication: Secondary | ICD-10-CM | POA: Diagnosis not present

## 2020-09-01 DIAGNOSIS — E669 Obesity, unspecified: Secondary | ICD-10-CM

## 2020-09-01 DIAGNOSIS — I480 Paroxysmal atrial fibrillation: Secondary | ICD-10-CM | POA: Diagnosis not present

## 2020-09-01 DIAGNOSIS — E039 Hypothyroidism, unspecified: Secondary | ICD-10-CM | POA: Diagnosis not present

## 2020-09-01 NOTE — Patient Instructions (Signed)

## 2020-09-01 NOTE — Progress Notes (Signed)
Cardiology Office Note:    Date:  09/01/2020   ID:  Jennifer Miller, DOB 1965-08-12, MRN [id number]  PCP:  Ladell Pier, MD  Cardiologist:  Kate Sable, MD  Electrophysiologist:  None   Referring MD: Ladell Pier, MD   Chief Complaint  Patient presents with  . Other    6 month follow up - Meds reviewed verbally with patient.     History of Present Illness:    Jennifer Miller is a 55 y.o. female with a hx of hypertension, paroxysmal atrial fibrillation on Pradaxa, diabetes,who presents for follow-up.    Patient being seen due to hypertension, paroxysmal atrial fibrillation.  Tolerating HCTZ, lisinopril, Lopressor as prescribed.  Blood pressure well controlled.  Denies palpitations.  Has left wrist pain which she thinks might be secondary to gout.  Also has right thumb pain.  Placed a wrist brace on his left arm to help.  She endorses occasionally eating salty foods.  She otherwise feels okay, is planning on exercising more and trying to lose weight.  Prior notes Patient was diagnosed with congestive heart failure about 19 years ago after having a baby.  She was fine before.  She recently moved to the area from [place].  She was placed on medications including Metroprolol tartrate, lisinopril, Lasix 40 mg every day.  She denies edema, orthopnea.  About 6 years ago, she states eating 3 hot dogs and drinking some soda while at her mother's home.  She later on noticed her heart was beating fast and irregular.  This prompted her to go to the ED.  She was evaluated and told she was in atrial fibrillation.  She was started on anticoagulation which she has taken since, currently takes Pradaxa twice daily.  She is a former smoker, denies any history of MI.  Amlodipine was stopped due to edema.  Past Medical History:  Diagnosis Date  . Congestive heart failure (CHF) ([place])   . Diabetes mellitus without complication ([place])   . HLD (hyperlipidemia)   . Hypertension   . Paroxysmal A-fib  [facility])     Past Surgical History:  Procedure Laterality Date  . DILATION AND CURETTAGE OF UTERUS    . KNEE ARTHROSCOPY AND ARTHROTOMY      Current Medications: Current Meds  Medication Sig  . albuterol (VENTOLIN HFA) 108 (90 Base) MCG/ACT inhaler Inhale 1-2 puffs into the lungs every 6 (six) hours as needed for wheezing or shortness of breath.  Marland Kitchen atorvastatin (LIPITOR) 20 MG tablet Take 1 tablet (20 mg total) by mouth daily.  . Blood Glucose Monitoring Suppl (TRUE METRIX METER) w/Device KIT Use as directed  . dabigatran (PRADAXA) 150 MG CAPS capsule TAKE 1 CAPSULE(150 MG) BY MOUTH TWICE DAILY  . fluticasone (FLONASE) 50 MCG/ACT nasal spray Place 1 spray into both nostrils daily.  Marland Kitchen glucose blood (TRUE METRIX BLOOD GLUCOSE TEST) test strip Check blood sugar fasting and before meals and again if pt feels bad (symptoms of hypo).  . hydrochlorothiazide (HYDRODIURIL) 50 MG tablet Take 1 tablet (50 mg total) by mouth daily.  Marland Kitchen levothyroxine (SYNTHROID) 100 MCG tablet TAKE 1/2 TABLET BY MOUTH DAILY BEFORE BREAKFAST  . lisinopril (ZESTRIL) 40 MG tablet Take 1 tablet (40 mg total) by mouth daily.  Marland Kitchen loratadine (CLARITIN) 10 MG tablet Take 1 tablet (10 mg total) by mouth daily as needed for allergies.  . metFORMIN (GLUCOPHAGE) 500 MG tablet Take 1 tablet (500 mg total) by mouth daily with breakfast. E11.9 Diabetes type 2 controlled  .  metoprolol tartrate (LOPRESSOR) 50 MG tablet Take 1 tablet (50 mg total) by mouth 2 (two) times daily.  . Multiple Vitamins-Minerals (MULTIVITAMIN WITH MINERALS) tablet Take 1 tablet by mouth daily.  Marland Kitchen PAZEO 0.7 % SOLN as needed.   . TRUEplus Lancets 28G MISC Check blood sugar fasting and before meals and again if pt feels bad (symptoms of hypo).  Marland Kitchen VITAMIN D, CHOLECALCIFEROL, PO Take by mouth.     Allergies:   Patient has no known allergies.   Social History   Socioeconomic History  . Marital status: Single    Spouse name: Not on file  . Number of children: 3   . Years of education: Not on file  . Highest education level: Not on file  Occupational History  . Occupation: unemployed  Tobacco Use  . Smoking status: Former Research scientist (life sciences)  . Smokeless tobacco: Never Used  Vaping Use  . Vaping Use: Never used  Substance and Sexual Activity  . Alcohol use: Yes    Comment: rarely  . Drug use: Never  . Sexual activity: Yes    Birth control/protection: None  Other Topics Concern  . Not on file  Social History Narrative  . Not on file   Social Determinants of Health   Financial Resource Strain: Not on file  Food Insecurity: Not on file  Transportation Needs: Not on file  Physical Activity: Not on file  Stress: Not on file  Social Connections: Not on file     Family History: The patient's family history includes Dementia in her mother; Diabetes in her father, maternal grandmother, mother, and paternal grandmother; Heart disease in her paternal grandmother; Hypertension in her father and mother.  ROS:   Please see the history of present illness.     All other systems reviewed and are negative.  EKGs/Labs/Other Studies Reviewed:    The following studies were reviewed today:   EKG:  EKG is ordered today. EKG shows normal sinus rhythm, normal ECG.  Recent Labs: 09/29/2019: ALT 31; B Natriuretic Peptide 12.6; Hemoglobin 13.8; Platelets 280 03/03/2020: BUN 14; Creatinine, Ser 0.71; Potassium 3.7; Sodium 137  Recent Lipid Panel    Component Value Date/Time   CHOL 134 03/23/2019 1643   TRIG 72 03/23/2019 1643   HDL 43 03/23/2019 1643   CHOLHDL 3.1 03/23/2019 1643   LDLCALC 76 03/23/2019 1643    Physical Exam:    VS:  BP 118/72 (BP Location: Left Arm, Patient Position: Sitting, Cuff Size: Large)   Pulse 81   Ht '5\' 6"'  (1.676 m)   Wt 266 lb (120.7 kg)   SpO2 96%   BMI 42.93 kg/m     Wt Readings from Last 3 Encounters:  09/01/20 266 lb (120.7 kg)  03/03/20 259 lb (117.5 kg)  01/17/20 259 lb 9.6 oz (117.8 kg)     GEN:  Well  nourished, well developed in no acute distress HEENT: Normal NECK: No JVD; No carotid bruits LYMPHATICS: No lymphadenopathy CARDIAC: RRR, no murmurs, rubs, gallops RESPIRATORY:  Clear to auscultation without rales, wheezing or rhonchi  ABDOMEN: Soft, non-tender, non-distended, obese MUSCULOSKELETAL: no edema; mild right-sided chest tenderness on palpation SKIN: Warm and dry NEUROLOGIC:  Alert and oriented x 3 PSYCHIATRIC:  Normal affect   ASSESSMENT:    1. Primary hypertension   2. PAF (paroxysmal atrial fibrillation) (HCC)   3. Pure hypercholesterolemia    PLAN:    In order of problems listed above;  1. Hypertension, BP controlled..  Continue HCTZ 50 mg daily,  lisinopril 40 mg daily, Lopressor 50 mg twice daily.  2. History of paroxysmal A. fib, currently in sinus rhythm. Lopressor, Pradaxa. 3. History of hyperlipidemia, continue Lipitor.  Follow-up in 6 months  Total encounter time 30 minutes  Greater than 50% was spent in counseling and coordination of care with the patient  Medication Adjustments/Labs and Tests Ordered: Current medicines are reviewed at length with the patient today.  Concerns regarding medicines are outlined above.  Orders Placed This Encounter  Procedures  . EKG 12-Lead   No orders of the defined types were placed in this encounter.   Patient Instructions  Medication Instructions:  Your physician recommends that you continue on your current medications as directed. Please refer to the Current Medication list given to you today.  *If you need a refill on your cardiac medications before your next appointment, please call your pharmacy*   Lab Work: None ordered If you have labs (blood work) drawn today and your tests are completely normal, you will receive your results only by: Marland Kitchen MyChart Message (if you have MyChart) OR . A paper copy in the mail If you have any lab test that is abnormal or we need to change your treatment, we will call you to  review the results.   Testing/Procedures: None ordered   Follow-Up: At [facility], you and your health needs are our priority.  As part of our continuing mission to provide you with exceptional heart care, we have created designated Provider Care Teams.  These Care Teams include your primary Cardiologist (physician) and Advanced Practice Providers (APPs -  Physician Assistants and Nurse Practitioners) who all work together to provide you with the care you need, when you need it.  We recommend signing up for the patient portal called "MyChart".  Sign up information is provided on this After Visit Summary.  MyChart is used to connect with patients for Virtual Visits (Telemedicine).  Patients are able to view lab/test results, encounter notes, upcoming appointments, etc.  Non-urgent messages can be sent to your provider as well.   To learn more about what you can do with MyChart, go to [email or web address].    Your next appointment:   6 month(s)  The format for your next appointment:   In Person  Provider:   Kate Sable, MD   Other Instructions      Signed, Kate Sable, MD  09/01/2020 4:41 PM    [place]

## 2020-09-02 ENCOUNTER — Other Ambulatory Visit: Payer: Self-pay | Admitting: Internal Medicine

## 2020-09-02 DIAGNOSIS — E1169 Type 2 diabetes mellitus with other specified complication: Secondary | ICD-10-CM

## 2020-09-02 DIAGNOSIS — E669 Obesity, unspecified: Secondary | ICD-10-CM

## 2020-09-02 MED ORDER — METFORMIN HCL 500 MG PO TABS: 500.0000 mg | ORAL_TABLET | Freq: Two times a day (BID) | ORAL | 3 refills | Status: DC

## 2020-09-02 NOTE — Progress Notes (Signed)
Let patient know that she does not have abnormal amounts of protein in the urine which is good.  Her hemoglobin A1c is 8 with goal being less than 7.  I recommend increasing metformin to 500 mg twice a day.  Updated prescription sent to her pharmacy.  Blood cell counts including red blood cell, white blood cell and platelet counts are normal.  Await results of the cholesterol study.

## 2020-09-03 ENCOUNTER — Telehealth: Payer: Self-pay

## 2020-09-03 LAB — COMPREHENSIVE METABOLIC PANEL
ALT: 45 IU/L — ABNORMAL HIGH (ref 0–32)
AST: 35 IU/L (ref 0–40)
Albumin/Globulin Ratio: 1.4 (ref 1.2–2.2)
Albumin: 4.5 g/dL (ref 3.8–4.9)
Alkaline Phosphatase: 61 IU/L (ref 44–121)
BUN/Creatinine Ratio: 13 (ref 9–23)
BUN: 11 mg/dL (ref 6–24)
Bilirubin Total: 0.4 mg/dL (ref 0.0–1.2)
CO2: 25 mmol/L (ref 20–29)
Calcium: 10.4 mg/dL — ABNORMAL HIGH (ref 8.7–10.2)
Chloride: 97 mmol/L (ref 96–106)
Creatinine, Ser: 0.87 mg/dL (ref 0.57–1.00)
Globulin, Total: 3.2 g/dL (ref 1.5–4.5)
Glucose: 107 mg/dL — ABNORMAL HIGH (ref 65–99)
Potassium: 4.2 mmol/L (ref 3.5–5.2)
Sodium: 140 mmol/L (ref 134–144)
Total Protein: 7.7 g/dL (ref 6.0–8.5)
eGFR: 80 mL/min/{1.73_m2} (ref >59)

## 2020-09-03 LAB — CBC
Hematocrit: 42 % (ref 34.0–46.6)
Hemoglobin: 13.4 g/dL (ref 11.1–15.9)
MCH: 26.5 pg — ABNORMAL LOW (ref 26.6–33.0)
MCHC: 31.9 g/dL (ref 31.5–35.7)
MCV: 83 fL (ref 79–97)
Platelets: 325 10*3/uL (ref 150–450)
RBC: 5.05 x10E6/uL (ref 3.77–5.28)
RDW: 15 % (ref 11.7–15.4)
WBC: 7.8 10*3/uL (ref 3.4–10.8)

## 2020-09-03 LAB — MICROALBUMIN / CREATININE URINE RATIO
Creatinine, Urine: 132.5 mg/dL
Microalb/Creat Ratio: 19 mg/g creat (ref 0–29)
Microalbumin, Urine: 24.8 ug/mL

## 2020-09-03 LAB — LIPID PANEL
Chol/HDL Ratio: 3.1 ratio (ref 0.0–4.4)
Cholesterol, Total: 115 mg/dL (ref 100–199)
HDL: 37 mg/dL — ABNORMAL LOW (ref >39)
LDL Chol Calc (NIH): 57 mg/dL (ref 0–99)
Triglycerides: 113 mg/dL (ref 0–149)
VLDL Cholesterol Cal: 21 mg/dL (ref 5–40)

## 2020-09-03 LAB — TSH: TSH: 2.5 u[IU]/mL (ref 0.450–4.500)

## 2020-09-03 LAB — HEMOGLOBIN A1C
Est. average glucose Bld gHb Est-mCnc: 183 mg/dL
Hgb A1c MFr Bld: 8 % — ABNORMAL HIGH (ref 4.8–5.6)

## 2020-09-03 NOTE — Telephone Encounter (Signed)
Contacted pt to go over lab results pt didn't answer lvm  

## 2020-09-24 ENCOUNTER — Other Ambulatory Visit: Payer: Self-pay | Admitting: Internal Medicine

## 2020-09-24 DIAGNOSIS — E039 Hypothyroidism, unspecified: Secondary | ICD-10-CM

## 2020-09-24 NOTE — Telephone Encounter (Signed)
Requested Prescriptions  Pending Prescriptions Disp Refills  . levothyroxine (SYNTHROID) 100 MCG tablet [Pharmacy Med Name: LEVOTHYROXINE 0.100MG  ( ) TAB] 15 tablet 11    Sig: TAKE 1/2 TABLET BY MOUTH DAILY BEFORE BREAKFAST     Endocrinology:  Hypothyroid Agents Failed - 09/24/2020  3:29 AM      Failed - TSH needs to be rechecked within 3 months after an abnormal result. Refill until TSH is due.      Passed - TSH in normal range and within 360 days    TSH  Date Value Ref Range Status  09/01/2020 2.500 0.450 - 4.500 uIU/mL Final         Passed - Valid encounter within last 12 months    Recent Outpatient Visits          1 month ago Type 2 diabetes mellitus with obesity (HCC)   [place] [facility] Jonah Blue B, MD   11 months ago Type 2 diabetes mellitus with obesity [facility])   [place] [facility] [place], Marzella Schlein, [place]   1 year ago Essential hypertension   [facility] Storm Frisk, MD   1 year ago Type 2 diabetes mellitus with obesity [facility])   [place] [facility] Jonah Blue B, MD   1 year ago Controlled type 2 diabetes mellitus without complication, without long-term current use of insulin [facility])   [place] [facility] Marcine Matar, MD      Future Appointments            In 1 month Laural Benes, Binnie Rail, MD [facility]

## 2020-10-28 ENCOUNTER — Ambulatory Visit: Payer: Medicaid Other | Attending: Internal Medicine | Admitting: Internal Medicine

## 2020-10-28 ENCOUNTER — Encounter: Payer: Self-pay | Admitting: Internal Medicine

## 2020-10-28 ENCOUNTER — Other Ambulatory Visit: Payer: Self-pay

## 2020-10-28 DIAGNOSIS — Z23 Encounter for immunization: Secondary | ICD-10-CM

## 2020-10-28 DIAGNOSIS — I480 Paroxysmal atrial fibrillation: Secondary | ICD-10-CM | POA: Diagnosis not present

## 2020-10-28 DIAGNOSIS — Z6841 Body Mass Index (BMI) 40.0 and over, adult: Secondary | ICD-10-CM | POA: Insufficient documentation

## 2020-10-28 DIAGNOSIS — E785 Hyperlipidemia, unspecified: Secondary | ICD-10-CM | POA: Diagnosis not present

## 2020-10-28 DIAGNOSIS — I10 Essential (primary) hypertension: Secondary | ICD-10-CM | POA: Diagnosis not present

## 2020-10-28 DIAGNOSIS — I509 Heart failure, unspecified: Secondary | ICD-10-CM | POA: Insufficient documentation

## 2020-10-28 DIAGNOSIS — Z833 Family history of diabetes mellitus: Secondary | ICD-10-CM | POA: Diagnosis not present

## 2020-10-28 DIAGNOSIS — Z7901 Long term (current) use of anticoagulants: Secondary | ICD-10-CM | POA: Insufficient documentation

## 2020-10-28 DIAGNOSIS — E119 Type 2 diabetes mellitus without complications: Secondary | ICD-10-CM | POA: Diagnosis present

## 2020-10-28 DIAGNOSIS — E1169 Type 2 diabetes mellitus with other specified complication: Secondary | ICD-10-CM

## 2020-10-28 DIAGNOSIS — Z8249 Family history of ischemic heart disease and other diseases of the circulatory system: Secondary | ICD-10-CM | POA: Insufficient documentation

## 2020-10-28 DIAGNOSIS — M654 Radial styloid tenosynovitis [de Quervain]: Secondary | ICD-10-CM | POA: Diagnosis not present

## 2020-10-28 DIAGNOSIS — Z79899 Other long term (current) drug therapy: Secondary | ICD-10-CM | POA: Diagnosis not present

## 2020-10-28 DIAGNOSIS — E669 Obesity, unspecified: Secondary | ICD-10-CM | POA: Diagnosis not present

## 2020-10-28 DIAGNOSIS — N950 Postmenopausal bleeding: Secondary | ICD-10-CM | POA: Diagnosis not present

## 2020-10-28 DIAGNOSIS — Z7984 Long term (current) use of oral hypoglycemic drugs: Secondary | ICD-10-CM | POA: Diagnosis not present

## 2020-10-28 DIAGNOSIS — I110 Hypertensive heart disease with heart failure: Secondary | ICD-10-CM | POA: Diagnosis not present

## 2020-10-28 LAB — GLUCOSE, POCT (MANUAL RESULT ENTRY): POC Glucose: 100 mg/dl — AB (ref 70–99)

## 2020-10-28 MED ORDER — LISINOPRIL 40 MG PO TABS: 40.0000 mg | ORAL_TABLET | Freq: Every day | ORAL | 3 refills | Status: DC

## 2020-10-28 MED ORDER — DICLOFENAC SODIUM 1 % EX GEL: 2.0000 g | Freq: Four times a day (QID) | CUTANEOUS | 1 refills | Status: DC

## 2020-10-28 NOTE — Progress Notes (Signed)
Patient ID: Jennifer Miller, female    DOB: 20-Aug-1965  MRN: [id number]  CC: Diabetes and Hypertension   Subjective: Jennifer Miller is a 55 y.o. female who presents for chronic ds management Her concerns today include:  Pt with hx of CHF, a.fib, HL, HTN, hypothyroid, IDA with hx of fibroid tumors, anxiety and DM.  DM:  Lab Results  Component Value Date   HGBA1C 8.0 (H) 09/01/2020   Results for orders placed or performed in visit on 10/28/20  POCT glucose (manual entry)  Result Value Ref Range   POC Glucose 100 (A) 70 - 99 mg/dl  I did a telephone visit with her in April.  She subsequently had blood test done last month and A1c was 8.  I recommended increasing the dose of metformin to 500 mg twice a day. Taking and tolerating the increase dose of Metformin.  Checks BS 2 x a wk.  Gives range 120-130 Wgh down 9 lbs from when she saw cardiologist last mth.  Walking 3-4x/wk for 30-40 mins.  She has cut back on the amount that she eats.   I had referred to ophthalmology for her diabetic eye exam.  Patient states she canceled the appointment because she had an eye doctor that she wanted to go to.  However it turns out they do not take her insurance.  HTN/PAF:  checks BP occasionally. Reports compliance with meds.  Not using too much salt in foods.  Ate some chips today.   Gets swelling in legs if she is hot or she eats a lot of salted foods.  Occasional palpitations.  No chest pains.  Saw cardiologist last mth.  No med changes.   Notice light vaginal bleeding today when she wipe No dysuria.   She had seen the gynecologist about 2 years ago for abnormal uterine bleeding.  She had a cervical polyp removed.  She has not had a menstrual cycle in about 2 years.  Complains of pain in the left wrist x 1.5 mths.  No known injury.  Pain started in the thumb and is worse with movement of the thumb.  Pain is also worse when she presses down on any surface with the hand or when she tries to grasp an  object.  She has not noticed any swelling. Patient Active Problem List   Diagnosis Date Noted   Pain due to onychomycosis of toenails of both feet 10/18/2019   Type 2 diabetes mellitus with obesity ([place]) 07/24/2019   Generalized anxiety disorder 08/28/2018   Controlled type 2 diabetes mellitus without complication, without long-term current use of insulin ([place]) 12/30/2017   Essential hypertension 12/30/2017   Iron deficiency anemia 12/30/2017   PAF (paroxysmal atrial fibrillation) ([place]) 12/30/2017   Acquired hypothyroidism 12/30/2017   History of colon polyps 12/30/2017   Hyperlipidemia 12/30/2017   Congestive heart failure ([place]) 12/30/2017     Current Outpatient Medications on File Prior to Visit  Medication Sig Dispense Refill   albuterol (VENTOLIN HFA) 108 (90 Base) MCG/ACT inhaler Inhale 1-2 puffs into the lungs every 6 (six) hours as needed for wheezing or shortness of breath. 18 g 1   atorvastatin (LIPITOR) 20 MG tablet Take 1 tablet (20 mg total) by mouth daily. 30 tablet 6   Blood Glucose Monitoring Suppl (TRUE METRIX METER) w/Device KIT Use as directed 1 kit 0   dabigatran (PRADAXA) 150 MG CAPS capsule TAKE 1 CAPSULE(150 MG) BY MOUTH TWICE DAILY 180 capsule 1   fluticasone (FLONASE) 50 MCG/ACT  nasal spray Place 1 spray into both nostrils daily. 15.8 g 6   glucose blood (TRUE METRIX BLOOD GLUCOSE TEST) test strip Check blood sugar fasting and before meals and again if pt feels bad (symptoms of hypo). 100 each 12   hydrochlorothiazide (HYDRODIURIL) 50 MG tablet Take 1 tablet (50 mg total) by mouth daily. 90 tablet 1   levothyroxine (SYNTHROID) 100 MCG tablet TAKE 1/2 TABLET BY MOUTH DAILY BEFORE BREAKFAST 15 tablet 4   loratadine (CLARITIN) 10 MG tablet Take 1 tablet (10 mg total) by mouth daily as needed for allergies. 30 tablet 2   metFORMIN (GLUCOPHAGE) 500 MG tablet Take 1 tablet (500 mg total) by mouth 2 (two) times daily with a meal. E11.9 Diabetes type 2 controlled 180 tablet  3   metoprolol tartrate (LOPRESSOR) 50 MG tablet Take 1 tablet (50 mg total) by mouth 2 (two) times daily. 180 tablet 0   Multiple Vitamins-Minerals (MULTIVITAMIN WITH MINERALS) tablet Take 1 tablet by mouth daily.     PAZEO 0.7 % SOLN as needed.      TRUEplus Lancets 28G MISC Check blood sugar fasting and before meals and again if pt feels bad (symptoms of hypo). 100 each 12   VITAMIN D, CHOLECALCIFEROL, PO Take by mouth.     No current facility-administered medications on file prior to visit.    No Known Allergies  Social History   Socioeconomic History   Marital status: Single    Spouse name: Not on file   Number of children: 3   Years of education: Not on file   Highest education level: Not on file  Occupational History   Occupation: unemployed  Tobacco Use   Smoking status: Former    Pack years: 0.00   Smokeless tobacco: Never  Vaping Use   Vaping Use: Never used  Substance and Sexual Activity   Alcohol use: Yes    Comment: rarely   Drug use: Never   Sexual activity: Yes    Birth control/protection: None  Other Topics Concern   Not on file  Social History Narrative   Not on file   Social Determinants of Health   Financial Resource Strain: Not on file  Food Insecurity: Not on file  Transportation Needs: Not on file  Physical Activity: Not on file  Stress: Not on file  Social Connections: Not on file  Intimate Partner Violence: Not on file    Family History  Problem Relation Age of Onset   Diabetes Mother    Hypertension Mother    Dementia Mother    Diabetes Father    Hypertension Father    Diabetes Maternal Grandmother    Diabetes Paternal Grandmother    Heart disease Paternal Grandmother     Past Surgical History:  Procedure Laterality Date   DILATION AND CURETTAGE OF UTERUS     KNEE ARTHROSCOPY AND ARTHROTOMY      ROS: Review of Systems Negative except as stated above  PHYSICAL EXAM: BP 126/83   Pulse 79   Resp 16   Wt 257 lb 9.6 oz  (116.8 kg)   SpO2 97%   BMI 41.58 kg/m   Wt Readings from Last 3 Encounters:  10/28/20 257 lb 9.6 oz (116.8 kg)  09/01/20 266 lb (120.7 kg)  03/03/20 259 lb (117.5 kg)    Physical Exam  General appearance - alert, well appearing, and in no distress Mental status - normal mood, behavior, speech, dress, motor activity, and thought processes Neck - supple, no significant  adenopathy Chest - clear to auscultation, no wheezes, rales or rhonchi, symmetric air entry Heart - normal rate, regular rhythm, normal S1, S2, no murmurs, rubs, clicks or gallops Musculoskeletal -left wrist: No edema or erythema.  Mild tenderness on palpation at the base of the thumb.  Mild discomfort with medial and lateral movement of the wrist.  Grip is 5/5 in both hands. Extremities - peripheral pulses normal, no pedal edema, no clubbing or cyanosis Diabetic Foot Exam - Simple   Simple Foot Form Visual Inspection See comments: Yes Sensation Testing Intact to touch and monofilament testing bilaterally: Yes Pulse Check Posterior Tibialis and Dorsalis pulse intact bilaterally: Yes Comments Dry skin on the soles of both feet.  Small callus on the medial aspect of the left toe.      CMP Latest Ref Rng & Units 09/01/2020 03/03/2020 09/29/2019  Glucose 65 - 99 mg/dL 107(H) 109(H) 123(H)  BUN 6 - 24 mg/dL _0 Creatinine 0.57 - 1.00 mg/dL 0.87 0.71 0.78  Sodium 134 - 144 mmol/L 140 137 137  Potassium 3.5 - 5.2 mmol/L 4.2 3.7 4.0  Chloride 96 - 106 mmol/L 97 98 101  CO2 20 - 29 mmol/L _1 Calcium 8.7 - 10.2 mg/dL 10.4(H) 10.0 9.0  Total Protein 6.0 - 8.5 g/dL 7.7 - 8.2(H)  Total Bilirubin 0.0 - 1.2 mg/dL 0.4 - 0.6  Alkaline Phos 44 - 121 IU/L 61 - 62  AST 0 - 40 IU/L 35 - 30  ALT 0 - 32 IU/L 45(H) - 31   Lipid Panel     Component Value Date/Time   CHOL 115 09/01/2020 1225   TRIG 113 09/01/2020 1225   HDL 37 (L) 09/01/2020 1225   CHOLHDL 3.1 09/01/2020 1225   LDLCALC 57 09/01/2020 1225    CBC     Component Value Date/Time   WBC 7.8 09/01/2020 1225   WBC 3.6 (L) 09/29/2019 0859   RBC 5.05 09/01/2020 1225   RBC 5.36 (H) 09/29/2019 0859   HGB 13.4 09/01/2020 1225   HCT 42.0 09/01/2020 1225   PLT 325 09/01/2020 1225   MCV 83 09/01/2020 1225   MCH 26.5 (L) 09/01/2020 1225   MCH 25.7 (L) 09/29/2019 0859   MCHC 31.9 09/01/2020 1225   MCHC 32.6 09/29/2019 0859   RDW 15.0 09/01/2020 1225   LYMPHSABS 1.4 09/29/2019 0859   MONOABS 0.5 09/29/2019 0859   EOSABS 0.0 09/29/2019 0859   BASOSABS 0.0 09/29/2019 0859    ASSESSMENT AND PLAN: 1. Type 2 diabetes mellitus with morbid obesity ([place]) Commended her on weight loss so far.  Encouraged her to keep up the good work with regular exercise and trying to eat healthy.  Recommend healthier snacks instead of chips. Continue metformin. - POCT glucose (manual entry) - Ambulatory referral to Ophthalmology  2. Essential hypertension Close to goal.  Continue lisinopril, metoprolol, hydrochlorothiazide  3. Postmenopausal bleeding - Ambulatory referral to Gynecology  4. De Quervain's tenosynovitis, left We will try her with an anti-inflammatory gel.  If no improvement we can refer to orthopedics. - diclofenac Sodium (VOLTAREN) 1 % GEL; Apply 2 g topically 4 (four) times daily.  Dispense: 100 g; Refill: 1  5. Need for Tdap vaccination Patient agreeable to receiving vaccine today. - Tdap vaccine greater than or equal to 7yo IM  6. Need for vaccination against Streptococcus pneumoniae Patient agreeable to receiving vaccine today. - Pneumococcal conjugate vaccine 20-valent (Prevnar 20)     Patient was given the opportunity  to ask questions.  Patient verbalized understanding of the plan and was able to repeat key elements of the plan.   Orders Placed This Encounter  Procedures   Pneumococcal conjugate vaccine 20-valent (Prevnar 20)   Tdap vaccine greater than or equal to 7yo IM   Ambulatory referral to Gynecology   Ambulatory  referral to Ophthalmology   POCT glucose (manual entry)     Requested Prescriptions   Signed Prescriptions Disp Refills   diclofenac Sodium (VOLTAREN) 1 % GEL 100 g 1    Sig: Apply 2 g topically 4 (four) times daily.    Return in about 4 months (around 02/27/2021).  Karle Plumber, MD, FACP

## 2020-10-28 NOTE — Patient Instructions (Signed)

## 2020-11-06 ENCOUNTER — Ambulatory Visit: Payer: Medicaid Other | Admitting: Podiatry

## 2020-11-08 ENCOUNTER — Other Ambulatory Visit: Payer: Self-pay | Admitting: Physician Assistant

## 2020-11-08 DIAGNOSIS — R0981 Nasal congestion: Secondary | ICD-10-CM

## 2020-11-11 ENCOUNTER — Telehealth: Payer: Self-pay | Admitting: Radiology

## 2020-11-11 NOTE — Telephone Encounter (Signed)
Left message for patient to call [facility] to schedule appointment from referral from Dr Laural Benes for Postmenopausal bleeding.

## 2020-11-11 NOTE — Telephone Encounter (Signed)
N95.0 (ICD-10-CM) - Postmenopausal bleeding

## 2020-11-20 ENCOUNTER — Encounter: Payer: Self-pay | Admitting: Podiatry

## 2020-11-20 ENCOUNTER — Other Ambulatory Visit: Payer: Self-pay

## 2020-11-20 ENCOUNTER — Ambulatory Visit (INDEPENDENT_AMBULATORY_CARE_PROVIDER_SITE_OTHER): Payer: Medicaid Other | Admitting: Podiatry

## 2020-11-20 DIAGNOSIS — E119 Type 2 diabetes mellitus without complications: Secondary | ICD-10-CM

## 2020-11-20 DIAGNOSIS — B351 Tinea unguium: Secondary | ICD-10-CM | POA: Diagnosis not present

## 2020-11-20 DIAGNOSIS — M79674 Pain in right toe(s): Secondary | ICD-10-CM | POA: Diagnosis not present

## 2020-11-20 DIAGNOSIS — M79675 Pain in left toe(s): Secondary | ICD-10-CM | POA: Diagnosis not present

## 2020-11-20 NOTE — Progress Notes (Signed)
This patient returns to my office for at risk foot care.  This patient requires this care by a professional since this patient will be at risk due to having  diabetes.    This patient is unable to cut nails herself since the patient cannot reach her nails.These nails are painful walking and wearing shoes.  This patient presents for at risk foot care today.  General Appearance  Alert, conversant and in no acute stress.  Vascular  Dorsalis pedis and posterior tibial  pulses are palpable  bilaterally.  Capillary return is within normal limits  bilaterally. Temperature is within normal limits  bilaterally.  Neurologic  Senn-Weinstein monofilament wire test within normal limits  bilaterally. Muscle power within normal limits bilaterally.  Nails Thick disfigured discolored nails with subungual debris  from hallux to fifth toes bilaterally. No evidence of bacterial infection or drainage bilaterally.  Orthopedic  No limitations of motion  feet .  No crepitus or effusions noted.  No bony pathology or digital deformities noted.  Skin  normotropic skin with no porokeratosis noted bilaterally.  No signs of infections or ulcers noted.   Right heel callus medially.  Onychomycosis  Pain in right toes  Pain in left toes  Consent was obtained for treatment procedures.   Mechanical debridement of nails 1-5  bilaterally performed with a nail nipper.  No dremel  usage since there was nail polish on her nails.    Return office visit   3 months             Told patient to return for periodic foot care and evaluation due to potential at risk complications.   Helane Gunther DPM

## 2020-11-24 ENCOUNTER — Other Ambulatory Visit: Payer: Self-pay | Admitting: *Deleted

## 2020-11-24 MED ORDER — METOPROLOL TARTRATE 50 MG PO TABS: 50.0000 mg | ORAL_TABLET | Freq: Two times a day (BID) | ORAL | 1 refills | Status: DC

## 2020-12-07 ENCOUNTER — Other Ambulatory Visit: Payer: Self-pay | Admitting: Physician Assistant

## 2020-12-07 DIAGNOSIS — U071 COVID-19: Secondary | ICD-10-CM

## 2020-12-07 NOTE — Telephone Encounter (Signed)
Requested Prescriptions  Pending Prescriptions Disp Refills  . PROAIR HFA 108 (90 Base) MCG/ACT inhaler [Pharmacy Med Name: PROAIR HFA ORAL INH (200  PFS) 8.5G] 17 g 1    Sig: INHALE 1 TO 2 PUFFS INTO THE LUNGS EVERY 6 HOURS AS NEEDED FOR WHEEZING OR SHORTNESS OF BREATH     Pulmonology:  Beta Agonists Failed - 12/07/2020  2:24 PM      Failed - One inhaler should last at least one month. If the patient is requesting refills earlier, contact the patient to check for uncontrolled symptoms.      Passed - Valid encounter within last 12 months    Recent Outpatient Visits          1 month ago Type 2 diabetes mellitus with morbid obesity (HCC)   [facility] Jonah Blue B, MD   3 months ago Type 2 diabetes mellitus with obesity [facility])   [facility] Marcine Matar, MD   1 year ago Type 2 diabetes mellitus with obesity [facility])   [facility] [place], Marzella Schlein, [place]   1 year ago Essential hypertension   [facility] Storm Frisk, MD   1 year ago Type 2 diabetes mellitus with obesity [facility])   [facility] Marcine Matar, MD      Future Appointments            In 2 months Laural Benes Binnie Rail, MD [facility]

## 2020-12-09 ENCOUNTER — Telehealth: Payer: Self-pay | Admitting: Radiology

## 2020-12-09 NOTE — Telephone Encounter (Signed)
Left message for patient to call [facility] to schedule appointment from referral for postmenopausal bleeding.

## 2021-02-20 ENCOUNTER — Other Ambulatory Visit: Payer: Self-pay | Admitting: Internal Medicine

## 2021-02-20 ENCOUNTER — Other Ambulatory Visit: Payer: Self-pay | Admitting: *Deleted

## 2021-02-20 DIAGNOSIS — E039 Hypothyroidism, unspecified: Secondary | ICD-10-CM

## 2021-02-20 MED ORDER — LISINOPRIL 40 MG PO TABS: 40.0000 mg | ORAL_TABLET | Freq: Every day | ORAL | 1 refills | Status: DC

## 2021-02-26 ENCOUNTER — Other Ambulatory Visit: Payer: Self-pay

## 2021-02-26 ENCOUNTER — Encounter: Payer: Self-pay | Admitting: Podiatry

## 2021-02-26 ENCOUNTER — Ambulatory Visit: Payer: Medicaid Other | Admitting: Podiatry

## 2021-02-26 DIAGNOSIS — E119 Type 2 diabetes mellitus without complications: Secondary | ICD-10-CM | POA: Diagnosis not present

## 2021-02-26 DIAGNOSIS — M79675 Pain in left toe(s): Secondary | ICD-10-CM | POA: Diagnosis not present

## 2021-02-26 DIAGNOSIS — M79674 Pain in right toe(s): Secondary | ICD-10-CM | POA: Diagnosis not present

## 2021-02-26 DIAGNOSIS — B351 Tinea unguium: Secondary | ICD-10-CM

## 2021-02-26 NOTE — Progress Notes (Signed)
This patient returns to my office for at risk foot care.  This patient requires this care by a professional since this patient will be at risk due to having  diabetes.    This patient is unable to cut nails herself since the patient cannot reach her nails.These nails are painful walking and wearing shoes.  This patient presents for at risk foot care today.  General Appearance  Alert, conversant and in no acute stress.  Vascular  Dorsalis pedis and posterior tibial  pulses are palpable  bilaterally.  Capillary return is within normal limits  bilaterally. Temperature is within normal limits  bilaterally.  Neurologic  Senn-Weinstein monofilament wire test within normal limits  bilaterally. Muscle power within normal limits bilaterally.  Nails Thick disfigured discolored nails with subungual debris  from hallux to fifth toes bilaterally. No evidence of bacterial infection or drainage bilaterally.  Orthopedic  No limitations of motion  feet .  No crepitus or effusions noted.  No bony pathology or digital deformities noted.  Skin  normotropic skin with no porokeratosis noted bilaterally.  No signs of infections or ulcers noted.   Right heel callus medially.  Onychomycosis  Pain in right toes  Pain in left toes  Consent was obtained for treatment procedures.   Mechanical debridement of nails 1-5  bilaterally performed with a nail nipper.  No dremel  usage since there was nail polish on her nails.    Return office visit   3 months             Told patient to return for periodic foot care and evaluation due to potential at risk complications.   Larin Weissberg DPM  

## 2021-02-27 ENCOUNTER — Encounter: Payer: Self-pay | Admitting: Internal Medicine

## 2021-02-27 ENCOUNTER — Ambulatory Visit: Payer: Medicaid Other | Attending: Internal Medicine | Admitting: Internal Medicine

## 2021-02-27 DIAGNOSIS — M654 Radial styloid tenosynovitis [de Quervain]: Secondary | ICD-10-CM | POA: Diagnosis not present

## 2021-02-27 DIAGNOSIS — E1169 Type 2 diabetes mellitus with other specified complication: Secondary | ICD-10-CM

## 2021-02-27 DIAGNOSIS — Z2821 Immunization not carried out because of patient refusal: Secondary | ICD-10-CM

## 2021-02-27 DIAGNOSIS — M62838 Other muscle spasm: Secondary | ICD-10-CM | POA: Diagnosis not present

## 2021-02-27 DIAGNOSIS — Z1231 Encounter for screening mammogram for malignant neoplasm of breast: Secondary | ICD-10-CM | POA: Diagnosis not present

## 2021-02-27 DIAGNOSIS — I10 Essential (primary) hypertension: Secondary | ICD-10-CM | POA: Diagnosis not present

## 2021-02-27 DIAGNOSIS — E785 Hyperlipidemia, unspecified: Secondary | ICD-10-CM

## 2021-02-27 DIAGNOSIS — E039 Hypothyroidism, unspecified: Secondary | ICD-10-CM | POA: Diagnosis not present

## 2021-02-27 LAB — POCT GLYCOSYLATED HEMOGLOBIN (HGB A1C): HbA1c, POC (controlled diabetic range): 7.2 % — AB (ref 0.0–7.0)

## 2021-02-27 NOTE — Patient Instructions (Signed)

## 2021-02-27 NOTE — Progress Notes (Signed)
Patient ID: Jennifer Miller, female    DOB: July 06, 1965  MRN: [id number]  CC: chronic ds management  Subjective: Jennifer Miller is a 55 y.o. female who presents for chronic ds management Her concerns today include:  Pt with hx of CHF, a.fib, HL, HTN, hypothyroid, IDA with hx of fibroid tumors, anxiety and DM.  DIABETES TYPE 2 Last A1C:   Results for orders placed or performed in visit on 02/27/21  POCT glycosylated hemoglobin (Hb A1C)  Result Value Ref Range   Hemoglobin A1C     HbA1c POC (<> result, manual entry)     HbA1c, POC (prediabetic range)     HbA1c, POC (controlled diabetic range) 7.2 (A) 0.0 - 7.0 %    Med Adherence:  _0  Yes.  On metformin 500 mg twice a day    _1  No Medication side effects:  _2  Yes    _3  No Home Monitoring?  _4  Yes    _5  No Home glucose results range: Diet Adherence: _6  Yes    _7  No - "I've been eating like crazy and stuff I shouldn't be eating." Exercise: was exercising but not consistently Hypoglycemic episodes?: _8  Yes    _9  No Numbness of the feet? _10  Yes    _11  No Retinopathy hx? _12  Yes    _13  No Last eye exam: referred in June 2022.  Has appt at [facility] 07/2021 Comments:  HL: taking and tolerating atorvastatin HTN: Taking HCTZ and Lisinopril as prescribed.  She tries to limit salt in the foods.  No chest pains or shortness of breath. Thyroid:  taking levothyroxine as prescribed.  Complains of having intermittent stabbing pains in the left flank x2 weeks.  Episodes do not occur every day and lasts about 2 minutes when they come on.  Denies any dysuria or urinary frequency.  When I last saw her she complained of pain in the left wrist that was going on for 1-1/2 months.  It started in the thumb and is worse with movement of the thumb.  She was assessed to have De Quervains tenosynovitis.  I prescribed Voltaren gel.  Patient reports no improvement.  HM:  declines Flu shot and shingles  Patient Active Problem List   Diagnosis Date  Noted   Pain due to onychomycosis of toenails of both feet 10/18/2019   Type 2 diabetes mellitus with obesity ([place]) 07/24/2019   Generalized anxiety disorder 08/28/2018   Controlled type 2 diabetes mellitus without complication, without long-term current use of insulin ([place]) 12/30/2017   Essential hypertension 12/30/2017   Iron deficiency anemia 12/30/2017   PAF (paroxysmal atrial fibrillation) ([place]) 12/30/2017   Acquired hypothyroidism 12/30/2017   History of colon polyps 12/30/2017   Hyperlipidemia 12/30/2017   Congestive heart failure ([place]) 12/30/2017     Current Outpatient Medications on File Prior to Visit  Medication Sig Dispense Refill   atorvastatin (LIPITOR) 20 MG tablet Take 1 tablet (20 mg total) by mouth daily. 30 tablet 6   Blood Glucose Monitoring Suppl (TRUE METRIX METER) w/Device KIT Use as directed 1 kit 0   dabigatran (PRADAXA) 150 MG CAPS capsule TAKE 1 CAPSULE(150 MG) BY MOUTH TWICE DAILY 180 capsule 1   diclofenac Sodium (VOLTAREN) 1 % GEL Apply 2 g topically 4 (four) times daily. 100 g 1   fluticasone (FLONASE) 50 MCG/ACT nasal spray SHAKE LIQUID AND USE 1 SPRAY IN EACH NOSTRIL DAILY 16 g 2   glucose blood (TRUE METRIX BLOOD GLUCOSE TEST) test strip Check blood  sugar fasting and before meals and again if pt feels bad (symptoms of hypo). 100 each 12   hydrochlorothiazide (HYDRODIURIL) 50 MG tablet Take 1 tablet (50 mg total) by mouth daily. 90 tablet 1   levothyroxine (SYNTHROID) 100 MCG tablet TAKE 1/2 TABLET BY MOUTH DAILY BEFORE BREAKFAST AS DIRECTED 15 tablet 1   lisinopril (ZESTRIL) 40 MG tablet Take 1 tablet (40 mg total) by mouth daily. PLEASE CALL OFFICE TO SCHEDULE OFFICE VISIT FOR FURTHER REFILLS. 30 tablet 1   loratadine (CLARITIN) 10 MG tablet Take 1 tablet (10 mg total) by mouth daily as needed for allergies. 30 tablet 2   metFORMIN (GLUCOPHAGE) 500 MG tablet Take 1 tablet (500 mg total) by mouth 2 (two) times daily with a meal. E11.9 Diabetes type 2  controlled 180 tablet 3   Multiple Vitamins-Minerals (MULTIVITAMIN WITH MINERALS) tablet Take 1 tablet by mouth daily.     PAZEO 0.7 % SOLN as needed.      PROAIR HFA 108 (90 Base) MCG/ACT inhaler INHALE 1 TO 2 PUFFS INTO THE LUNGS EVERY 6 HOURS AS NEEDED FOR WHEEZING OR SHORTNESS OF BREATH 17 g 1   TRUEplus Lancets 28G MISC Check blood sugar fasting and before meals and again if pt feels bad (symptoms of hypo). 100 each 12   VITAMIN D, CHOLECALCIFEROL, PO Take by mouth.     metoprolol tartrate (LOPRESSOR) 50 MG tablet Take 1 tablet (50 mg total) by mouth 2 (two) times daily. 180 tablet 1   No current facility-administered medications on file prior to visit.    No Known Allergies  Social History   Socioeconomic History   Marital status: Single    Spouse name: Not on file   Number of children: 3   Years of education: Not on file   Highest education level: Not on file  Occupational History   Occupation: unemployed  Tobacco Use   Smoking status: Former   Smokeless tobacco: Never  Scientific laboratory technician Use: Never used  Substance and Sexual Activity   Alcohol use: Yes    Comment: rarely   Drug use: Never   Sexual activity: Yes    Birth control/protection: None  Other Topics Concern   Not on file  Social History Narrative   Not on file   Social Determinants of Health   Financial Resource Strain: Not on file  Food Insecurity: Not on file  Transportation Needs: Not on file  Physical Activity: Not on file  Stress: Not on file  Social Connections: Not on file  Intimate Partner Violence: Not on file    Family History  Problem Relation Age of Onset   Diabetes Mother    Hypertension Mother    Dementia Mother    Diabetes Father    Hypertension Father    Diabetes Maternal Grandmother    Diabetes Paternal Grandmother    Heart disease Paternal Grandmother     Past Surgical History:  Procedure Laterality Date   DILATION AND CURETTAGE OF UTERUS     KNEE ARTHROSCOPY AND  ARTHROTOMY      ROS: Review of Systems Negative except as stated above  PHYSICAL EXAM: BP 135/80   Pulse 75   Ht _0  (1.676 m)   Wt 261 lb 12.8 oz (118.8 kg)   SpO2 98%   BMI 42.26 kg/m   Wt Readings from Last 3 Encounters:  02/27/21 261 lb 12.8 oz (118.8 kg)  10/28/20 257 lb 9.6 oz (116.8 kg)  09/01/20 266 lb (  120.7 kg)    Physical Exam  General appearance - alert, well appearing, and in no distress Mental status - normal mood, behavior, speech, dress, motor activity, and thought processes Neck - supple, no significant adenopathy Chest - clear to auscultation, no wheezes, rales or rhonchi, symmetric air entry Heart - normal rate, regular rhythm, normal S1, S2, no murmurs, rubs, clicks or gallops Musculoskeletal -left wrist: Mild edema.  Discomfort with passive range of motion of the thumb and wrist. No tenderness on palpation of the thoracic and lumbar spine and surrounding paraspinal muscles. Extremities - peripheral pulses normal, no pedal edema, no clubbing or cyanosis   CMP Latest Ref Rng & Units 09/01/2020 03/03/2020 09/29/2019  Glucose 65 - 99 mg/dL 107(H) 109(H) 123(H)  BUN 6 - 24 mg/dL _0 Creatinine 0.57 - 1.00 mg/dL 0.87 0.71 0.78  Sodium 134 - 144 mmol/L 140 137 137  Potassium 3.5 - 5.2 mmol/L 4.2 3.7 4.0  Chloride 96 - 106 mmol/L 97 98 101  CO2 20 - 29 mmol/L _1 Calcium 8.7 - 10.2 mg/dL 10.4(H) 10.0 9.0  Total Protein 6.0 - 8.5 g/dL 7.7 - 8.2(H)  Total Bilirubin 0.0 - 1.2 mg/dL 0.4 - 0.6  Alkaline Phos 44 - 121 IU/L 61 - 62  AST 0 - 40 IU/L 35 - 30  ALT 0 - 32 IU/L 45(H) - 31   Lipid Panel     Component Value Date/Time   CHOL 115 09/01/2020 1225   TRIG 113 09/01/2020 1225   HDL 37 (L) 09/01/2020 1225   CHOLHDL 3.1 09/01/2020 1225   LDLCALC 57 09/01/2020 1225    CBC    Component Value Date/Time   WBC 7.8 09/01/2020 1225   WBC 3.6 (L) 09/29/2019 0859   RBC 5.05 09/01/2020 1225   RBC 5.36 (H) 09/29/2019 0859   HGB 13.4 09/01/2020  1225   HCT 42.0 09/01/2020 1225   PLT 325 09/01/2020 1225   MCV 83 09/01/2020 1225   MCH 26.5 (L) 09/01/2020 1225   MCH 25.7 (L) 09/29/2019 0859   MCHC 31.9 09/01/2020 1225   MCHC 32.6 09/29/2019 0859   RDW 15.0 09/01/2020 1225   LYMPHSABS 1.4 09/29/2019 0859   MONOABS 0.5 09/29/2019 0859   EOSABS 0.0 09/29/2019 0859   BASOSABS 0.0 09/29/2019 0859    ASSESSMENT AND PLAN: 1. Type 2 diabetes mellitus with morbid obesity ([place]) Close to goal and improving. Discussed and encourage healthy eating habits.  Printed information given. Encouraged her to move more with a goal of getting in some form of moderate intensity exercise 4 to 5 days a week. - POCT glycosylated hemoglobin (Hb A1C)  2. Essential hypertension To goal.  Continue lisinopril, metoprolol and hydrochlorothiazide  3. Hyperlipidemia associated with type 2 diabetes mellitus (HCC) Continue atorvastatin  4. Acquired hypothyroidism Tinea levothyroxine.  Last TSH was normal.  5. De Quervain's tenosynovitis, left - Ambulatory referral to Orthopedic Surgery  6. Muscle spasm I think the intermittent stabbing pains that she is getting is likely muscle cramps which could be due to atorvastatin.  Patient reports that her cardiologist had given her a muscle relaxant to take as needed.  She states that she will try taking that.  7. Encounter for screening mammogram for malignant neoplasm of breast Referral placed for mammogram  8. Influenza vaccination declined     Patient was given the opportunity to ask questions.  Patient verbalized understanding of the plan and was able to repeat key elements of  the plan.   Orders Placed This Encounter  Procedures   POCT glycosylated hemoglobin (Hb A1C)     Requested Prescriptions    No prescriptions requested or ordered in this encounter    No follow-ups on file.  Karle Plumber, MD, FACP

## 2021-03-08 NOTE — Progress Notes (Signed)
Cardiology Office Note:    Date:  03/09/2021   ID:  Jennifer Miller, DOB 1965/05/13, MRN [id number]  PCP:  Ladell Pier, MD  [facility] Cardiologist:  Kate Sable, MD  [place] Electrophysiologist:  None   Referring MD: Ladell Pier, MD   Chief Complaint:  6 month follow-up  History of Present Illness:    Jennifer Miller is a 55 y.o. female with a hx of HTN, paroxysmal afib on Pradaxa, DM2, h/o tobacco use who presents for follow-up.  Patient was diagnosed diagnosed with CHF after childbirth. In 2014 she was diagnosed with afib and started on Pradaxa. Amlodipine previously stopped due to lower leg edema.   Last seen 09/01/20 and was symptomatically stable. No changes were made.  Today, the patient reports palpitations. They occur occasionally, seem to be worse and more frequent. Worse when she has gas. Feels heart racing. Last a couple seconds. Make her feel stressed out, has panic attacks also. Sometimes it feels like afib. Has chronic chest pain, mostly MSK. No shortness of breath. Says she is drinking a lot of coffee. No alcohol or drug use.   Past Medical History:  Diagnosis Date   Congestive heart failure (CHF) ([place])    Diabetes mellitus without complication (HCC)    HLD (hyperlipidemia)    Hypertension    Paroxysmal A-fib (HCC)     Past Surgical History:  Procedure Laterality Date   DILATION AND CURETTAGE OF UTERUS     KNEE ARTHROSCOPY AND ARTHROTOMY      Current Medications: Current Meds  Medication Sig   atorvastatin (LIPITOR) 20 MG tablet Take 1 tablet (20 mg total) by mouth daily.   Blood Glucose Monitoring Suppl (TRUE METRIX METER) w/Device KIT Use as directed   dabigatran (PRADAXA) 150 MG CAPS capsule TAKE 1 CAPSULE(150 MG) BY MOUTH TWICE DAILY   fluticasone (FLONASE) 50 MCG/ACT nasal spray SHAKE LIQUID AND USE 1 SPRAY IN EACH NOSTRIL DAILY   glucose blood (TRUE METRIX BLOOD GLUCOSE TEST) test strip Check blood sugar fasting and before  meals and again if pt feels bad (symptoms of hypo).   hydrochlorothiazide (HYDRODIURIL) 50 MG tablet Take 1 tablet (50 mg total) by mouth daily.   levothyroxine (SYNTHROID) 100 MCG tablet TAKE 1/2 TABLET BY MOUTH DAILY BEFORE BREAKFAST AS DIRECTED   lisinopril (ZESTRIL) 40 MG tablet Take 1 tablet (40 mg total) by mouth daily. PLEASE CALL OFFICE TO SCHEDULE OFFICE VISIT FOR FURTHER REFILLS.   loratadine (CLARITIN) 10 MG tablet Take 1 tablet (10 mg total) by mouth daily as needed for allergies.   metFORMIN (GLUCOPHAGE) 500 MG tablet Take 1 tablet (500 mg total) by mouth 2 (two) times daily with a meal. E11.9 Diabetes type 2 controlled   metoprolol tartrate (LOPRESSOR) 50 MG tablet Take 1 tablet (50 mg total) by mouth 2 (two) times daily.   PAZEO 0.7 % SOLN as needed.    PROAIR HFA 108 (90 Base) MCG/ACT inhaler INHALE 1 TO 2 PUFFS INTO THE LUNGS EVERY 6 HOURS AS NEEDED FOR WHEEZING OR SHORTNESS OF BREATH   TRUEplus Lancets 28G MISC Check blood sugar fasting and before meals and again if pt feels bad (symptoms of hypo).   VITAMIN D, CHOLECALCIFEROL, PO Take by mouth.     Allergies:   Patient has no known allergies.   Social History   Socioeconomic History   Marital status: Single    Spouse name: Not on file   Number of children: 3   Years of education:  Not on file   Highest education level: Not on file  Occupational History   Occupation: unemployed  Tobacco Use   Smoking status: Former   Smokeless tobacco: Never  Scientific laboratory technician Use: Never used  Substance and Sexual Activity   Alcohol use: Yes    Comment: rarely   Drug use: Never   Sexual activity: Yes    Birth control/protection: None  Other Topics Concern   Not on file  Social History Narrative   Not on file   Social Determinants of Health   Financial Resource Strain: Not on file  Food Insecurity: Not on file  Transportation Needs: Not on file  Physical Activity: Not on file  Stress: Not on file  Social Connections:  Not on file     Family History: The patient's family history includes Dementia in her mother; Diabetes in her father, maternal grandmother, mother, and paternal grandmother; Heart disease in her paternal grandmother; Hypertension in her father and mother.  ROS:   Please see the history of present illness.     All other systems reviewed and are negative.  EKGs/Labs/Other Studies Reviewed:    The following studies were reviewed today:  Echo 03/2019   1. Left ventricular ejection fraction, by visual estimation, is 55 to  60%. The left ventricle has normal function. There is mildly increased  left ventricular hypertrophy.   2. Left ventricular diastolic parameters are consistent with Grade I  diastolic dysfunction (impaired relaxation).   3. The left ventricle has no regional wall motion abnormalities.   4. Global right ventricle has normal systolic function.The right  ventricular size is mildly enlarged. No increase in right ventricular wall  thickness.   5. Pulmonary artery pressure is upper normal (25-30 mmHg plus central  venous pressure).   6. Left atrial size was normal.   7. Right atrial size was normal.   8. The pericardium was not well visualized.   9. The mitral valve is normal in structure. Trace mitral valve  regurgitation.  10. The tricuspid valve is grossly normal. Tricuspid valve regurgitation  mild-moderate.  11. The aortic valve was not well visualized. Aortic valve regurgitation  is not visualized. There is no aortic valve stenosis.  12. The pulmonic valve was not well visualized. Pulmonic valve  regurgitation is not visualized.  13. The interatrial septum was not well visualized.    EKG:  EKG is  ordered today.  The ekg ordered today demonstrates NSR, 63bpm, low votlage, nonspecific ST /T wave changes  Recent Labs: 09/01/2020: ALT 45; BUN 11; Creatinine, Ser 0.87; Hemoglobin 13.4; Platelets 325; Potassium 4.2; Sodium 140; TSH 2.500  Recent Lipid Panel     Component Value Date/Time   CHOL 115 09/01/2020 1225   TRIG 113 09/01/2020 1225   HDL 37 (L) 09/01/2020 1225   CHOLHDL 3.1 09/01/2020 1225   LDLCALC 57 09/01/2020 1225    Physical Exam:    VS:  BP 120/80 (BP Location: Left Arm, Patient Position: Sitting, Cuff Size: Large)   Pulse 63   Ht '5\' 7"'  (1.702 m)   Wt 260 lb (117.9 kg)   SpO2 97%   BMI 40.72 kg/m     Wt Readings from Last 3 Encounters:  03/09/21 260 lb (117.9 kg)  02/27/21 261 lb 12.8 oz (118.8 kg)  10/28/20 257 lb 9.6 oz (116.8 kg)     GEN:  Well nourished, well developed in no acute distress HEENT: Normal NECK: No JVD; No carotid bruits LYMPHATICS: No  lymphadenopathy CARDIAC: RRR, no murmurs, rubs, gallops RESPIRATORY:  Clear to auscultation without rales, wheezing or rhonchi  ABDOMEN: Soft, non-tender, non-distended MUSCULOSKELETAL:  No edema; No deformity  SKIN: Warm and dry NEUROLOGIC:  Alert and oriented x 3 PSYCHIATRIC:  Normal affect   ASSESSMENT:    1. Palpitations   2. PAF (paroxysmal atrial fibrillation) ([place])   3. Chronic congestive heart failure, unspecified heart failure type ([place])    PLAN:    In order of problems listed above:  Palpitations PAF Patient reports increase in palpitations over the last few weeks. Seem to be worse with GERD. Also has been drinking more caffeine than normal, recommended she decrease this. H/o PAF on Pradaxa, but unsure if this feels similar to prior episodes of afib. She reports compliance with a/c. EKG with NSR. I will check 2 week heart monitor. Check BMET, CBC, Mag, TSH. We will see her back after heart monitor.   HTN BP good today. Continue Lisinopril, HCTZ, Lopressor.  HLD LDL 57 08/2020. Continue Atorvastatin.    Disposition: Follow up in 2 month(s) with MD/APP   Signed, Shada Nienaber Ninfa Meeker, PA-C  03/09/2021 11:15 AM    [facility]

## 2021-03-09 ENCOUNTER — Ambulatory Visit (INDEPENDENT_AMBULATORY_CARE_PROVIDER_SITE_OTHER): Payer: Medicaid Other | Admitting: Medical

## 2021-03-09 ENCOUNTER — Other Ambulatory Visit: Payer: Self-pay

## 2021-03-09 ENCOUNTER — Encounter: Payer: Self-pay | Admitting: Medical

## 2021-03-09 ENCOUNTER — Ambulatory Visit (INDEPENDENT_AMBULATORY_CARE_PROVIDER_SITE_OTHER): Payer: Medicaid Other

## 2021-03-09 VITALS — BP 120/80 | HR 63 | Ht 67.0 in | Wt 260.0 lb

## 2021-03-09 DIAGNOSIS — R002 Palpitations: Secondary | ICD-10-CM

## 2021-03-09 DIAGNOSIS — I509 Heart failure, unspecified: Secondary | ICD-10-CM | POA: Diagnosis not present

## 2021-03-09 DIAGNOSIS — I480 Paroxysmal atrial fibrillation: Secondary | ICD-10-CM | POA: Diagnosis not present

## 2021-03-09 NOTE — Patient Instructions (Signed)
Medication Instructions:  No changes at this time.  *If you need a refill on your cardiac medications before your next appointment, please call your pharmacy*   Lab Work: BMET, MAG, CBC, and TSH today  If you have labs (blood work) drawn today and your tests are completely normal, you will receive your results only by: MyChart Message (if you have MyChart) OR A paper copy in the mail If you have any lab test that is abnormal or we need to change your treatment, we will call you to review the results.   Testing/Procedures: Your physician has recommended that you wear a Zio monitor for 2 weeks. Remove on March 23, 2021 and return in box provided.  This monitor is a medical device that records the heart's electrical activity. Doctors most often use these monitors to diagnose arrhythmias. Arrhythmias are problems with the speed or rhythm of the heartbeat. The monitor is a small device applied to your chest. You can wear one while you do your normal daily activities. While wearing this monitor if you have any symptoms to push the button and record what you felt. Once you have worn this monitor for the period of time provider prescribed (Usually 14 days), you will return the monitor device in the postage paid box. Once it is returned they will download the data collected and provide [place] with a report which the provider will then review and we will call you with those results. Important tips:  Avoid showering during the first 24 hours of wearing the monitor. Avoid excessive sweating to help maximize wear time. Do not submerge the device, no hot tubs, and no swimming pools. Keep any lotions or oils away from the patch. After 24 hours you may shower with the patch on. Take brief showers with your back facing the shower head.  Do not remove patch once it has been placed because that will interrupt data and decrease adhesive wear time. Push the button when you have any symptoms and write down what  you were feeling. Once you have completed wearing your monitor, remove and place into box which has postage paid and place in your outgoing mailbox.  If for some reason you have misplaced your box then call our office and we can provide another box and/or mail it off for you.      Follow-Up: At [facility], you and your health needs are our priority.  As part of our continuing mission to provide you with exceptional heart care, we have created designated Provider Care Teams.  These Care Teams include your primary Cardiologist (physician) and Advanced Practice Providers (APPs -  Physician Assistants and Nurse Practitioners) who all work together to provide you with the care you need, when you need it.   Your next appointment:   2 month(s)  The format for your next appointment:   In Person  Provider:   You may see Debbe Odea, MD or one of the following Advanced Practice Providers on your designated Care Team:   Nicolasa Ducking, NP Eula Listen, PA-C Cadence Fransico [place], [place]

## 2021-03-10 LAB — CBC
Hematocrit: 40.9 % (ref 34.0–46.6)
Hemoglobin: 13.2 g/dL (ref 11.1–15.9)
MCH: 26.2 pg — ABNORMAL LOW (ref 26.6–33.0)
MCHC: 32.3 g/dL (ref 31.5–35.7)
MCV: 81 fL (ref 79–97)
Platelets: 307 10*3/uL (ref 150–450)
RBC: 5.03 x10E6/uL (ref 3.77–5.28)
RDW: 15 % (ref 11.7–15.4)
WBC: 6.9 10*3/uL (ref 3.4–10.8)

## 2021-03-10 LAB — BASIC METABOLIC PANEL
BUN/Creatinine Ratio: 15 (ref 9–23)
BUN: 12 mg/dL (ref 6–24)
CO2: 27 mmol/L (ref 20–29)
Calcium: 10.2 mg/dL (ref 8.7–10.2)
Chloride: 99 mmol/L (ref 96–106)
Creatinine, Ser: 0.8 mg/dL (ref 0.57–1.00)
Glucose: 113 mg/dL — ABNORMAL HIGH (ref 70–99)
Potassium: 4.7 mmol/L (ref 3.5–5.2)
Sodium: 137 mmol/L (ref 134–144)
eGFR: 88 mL/min/{1.73_m2} (ref >59)

## 2021-03-10 LAB — MAGNESIUM: Magnesium: 1.7 mg/dL (ref 1.6–2.3)

## 2021-03-10 LAB — TSH: TSH: 1.81 u[IU]/mL (ref 0.450–4.500)

## 2021-03-17 ENCOUNTER — Encounter: Payer: Self-pay | Admitting: Obstetrics & Gynecology

## 2021-03-17 ENCOUNTER — Other Ambulatory Visit (HOSPITAL_COMMUNITY)
Admission: RE | Admit: 2021-03-17 | Discharge: 2021-03-17 | Disposition: A | Discharge status: Home / Self Care | Payer: Medicaid Other | Source: Ambulatory Visit | Attending: Obstetrics & Gynecology | Admitting: Obstetrics & Gynecology

## 2021-03-17 ENCOUNTER — Other Ambulatory Visit: Payer: Self-pay

## 2021-03-17 ENCOUNTER — Ambulatory Visit (INDEPENDENT_AMBULATORY_CARE_PROVIDER_SITE_OTHER): Payer: Medicaid Other | Admitting: Obstetrics & Gynecology

## 2021-03-17 VITALS — BP 123/82 | HR 73 | Wt 260.0 lb

## 2021-03-17 DIAGNOSIS — Z1231 Encounter for screening mammogram for malignant neoplasm of breast: Secondary | ICD-10-CM | POA: Diagnosis not present

## 2021-03-17 DIAGNOSIS — Z01419 Encounter for gynecological examination (general) (routine) without abnormal findings: Secondary | ICD-10-CM | POA: Insufficient documentation

## 2021-03-17 NOTE — Progress Notes (Signed)
GYNECOLOGY ANNUAL PREVENTATIVE CARE ENCOUNTER NOTE  History:     Jennifer Miller is a 55 y.o. [phone] female here for a routine annual gynecologic exam.  Current complaints: none currently.   Denies abnormal vaginal bleeding, discharge, pelvic pain, problems with intercourse or other gynecologic concerns.    Gynecologic History No LMP recorded. Patient is perimenopausal. Contraception: none Last Pap: 02/09/2018. Result was ASCUS with negative HPV Last Mammogram: 01/10/2019.  Result was normal Last Colonoscopy: Never had one but had negative Cologard in 4/28 2021  Obstetric History OB History  Gravida Para Term Preterm AB Living  '4 3 3   1 3  ' SAB IAB Ectopic Multiple Live Births    1     3    # Outcome Date GA Lbr Len/2nd Weight Sex Delivery Anes PTL Lv  4 Term           3 Term           2 Term           1 IAB             Past Medical History:  Diagnosis Date   Congestive heart failure (CHF) ([place])    Diabetes mellitus without complication ([place])    HLD (hyperlipidemia)    Hypertension    Paroxysmal A-fib (HCC)     Past Surgical History:  Procedure Laterality Date   DILATION AND CURETTAGE OF UTERUS     KNEE ARTHROSCOPY AND ARTHROTOMY      Current Outpatient Medications on File Prior to Visit  Medication Sig Dispense Refill   atorvastatin (LIPITOR) 20 MG tablet Take 1 tablet (20 mg total) by mouth daily. 30 tablet 6   Blood Glucose Monitoring Suppl (TRUE METRIX METER) w/Device KIT Use as directed 1 kit 0   dabigatran (PRADAXA) 150 MG CAPS capsule TAKE 1 CAPSULE(150 MG) BY MOUTH TWICE DAILY 180 capsule 1   fluticasone (FLONASE) 50 MCG/ACT nasal spray SHAKE LIQUID AND USE 1 SPRAY IN EACH NOSTRIL DAILY 16 g 2   glucose blood (TRUE METRIX BLOOD GLUCOSE TEST) test strip Check blood sugar fasting and before meals and again if pt feels bad (symptoms of hypo). 100 each 12   hydrochlorothiazide (HYDRODIURIL) 50 MG tablet Take 1 tablet (50 mg total) by mouth daily. 90 tablet 1    levothyroxine (SYNTHROID) 100 MCG tablet TAKE 1/2 TABLET BY MOUTH DAILY BEFORE BREAKFAST AS DIRECTED 15 tablet 1   lisinopril (ZESTRIL) 40 MG tablet Take 1 tablet (40 mg total) by mouth daily. PLEASE CALL OFFICE TO SCHEDULE OFFICE VISIT FOR FURTHER REFILLS. 30 tablet 1   loratadine (CLARITIN) 10 MG tablet Take 1 tablet (10 mg total) by mouth daily as needed for allergies. 30 tablet 2   metFORMIN (GLUCOPHAGE) 500 MG tablet Take 1 tablet (500 mg total) by mouth 2 (two) times daily with a meal. E11.9 Diabetes type 2 controlled 180 tablet 3   PROAIR HFA 108 (90 Base) MCG/ACT inhaler INHALE 1 TO 2 PUFFS INTO THE LUNGS EVERY 6 HOURS AS NEEDED FOR WHEEZING OR SHORTNESS OF BREATH 17 g 1   TRUEplus Lancets 28G MISC Check blood sugar fasting and before meals and again if pt feels bad (symptoms of hypo). 100 each 12   VITAMIN D, CHOLECALCIFEROL, PO Take by mouth.     metoprolol tartrate (LOPRESSOR) 50 MG tablet Take 1 tablet (50 mg total) by mouth 2 (two) times daily. 180 tablet 1   PAZEO 0.7 % SOLN as needed.  No current facility-administered medications on file prior to visit.    No Known Allergies  Social History:  reports that she has quit smoking. She has never used smokeless tobacco. She reports current alcohol use. She reports that she does not use drugs.  Family History  Problem Relation Age of Onset   Diabetes Mother    Hypertension Mother    Dementia Mother    Diabetes Father    Hypertension Father    Diabetes Maternal Grandmother    Diabetes Paternal Grandmother    Heart disease Paternal Grandmother     The following portions of the patient's history were reviewed and updated as appropriate: allergies, current medications, past family history, past medical history, past social history, past surgical history and problem list.  Review of Systems Pertinent items noted in HPI and remainder of comprehensive ROS otherwise negative.  Physical Exam:  BP 123/82   Pulse 73   Wt 260 lb  (117.9 kg)   BMI 40.72 kg/m  CONSTITUTIONAL: Well-developed, well-nourished female in no acute distress.  HENT:  Normocephalic, atraumatic, External right and left ear normal.  EYES: Conjunctivae and EOM are normal. Pupils are equal, round, and reactive to light. No scleral icterus.  NECK: Normal range of motion, supple, no masses.  Normal thyroid.  SKIN: Skin is warm and dry. No rash noted. Not diaphoretic. No erythema. No pallor. MUSCULOSKELETAL: Normal range of motion. No tenderness.  No cyanosis, clubbing, or edema.   NEUROLOGIC: Alert and oriented to person, place, and time. Normal reflexes, muscle tone coordination. No cranial nerve deficit noted. PSYCHIATRIC: Normal mood and affect. Normal behavior. Normal judgment and thought content. CARDIOVASCULAR: Normal heart rate noted, regular rhythm RESPIRATORY: Clear to auscultation bilaterally. Effort and breath sounds normal, no problems with respiration noted. BREASTS: Symmetric in size. No masses, tenderness, skin changes, nipple drainage, or lymphadenopathy bilaterally.  Performed in the presence of a chaperone. ABDOMEN: Soft, obese, no distention appreciated.  No tenderness, rebound or guarding.  PELVIC: Normal appearing external genitalia and urethral meatus; normal appearing vaginal mucosa and cervix.  Normal appearing discharge.  Pap smear obtained.  Unable to palpate uterus or adnexa secondary to habitus.  Performed in the presence of a chaperone.   Assessment and Plan:    1. Breast cancer screening by mammogram - MM 3D SCREEN BREAST BILATERAL; Future Mammogram scheduled  2. Well woman exam with routine gynecological exam - Cytology - PAP Will follow up results of pap smear and manage accordingly.  Colon cancer screening is up to date; had negative Cologuard in 2021. Routine preventative health maintenance measures emphasized. Please refer to After Visit Summary for other counseling recommendations.      Verita Schneiders, MD,  [place] for [employer], [place]

## 2021-03-23 IMAGING — DX DG CHEST 1V
1 series · 1 of 1 positions shown · non-contrast
Comparison: 02/01/2019 chest radiograph.

CLINICAL DATA: Cough and chest congestion

EXAM:
CHEST  1 VIEW

[chest ap]
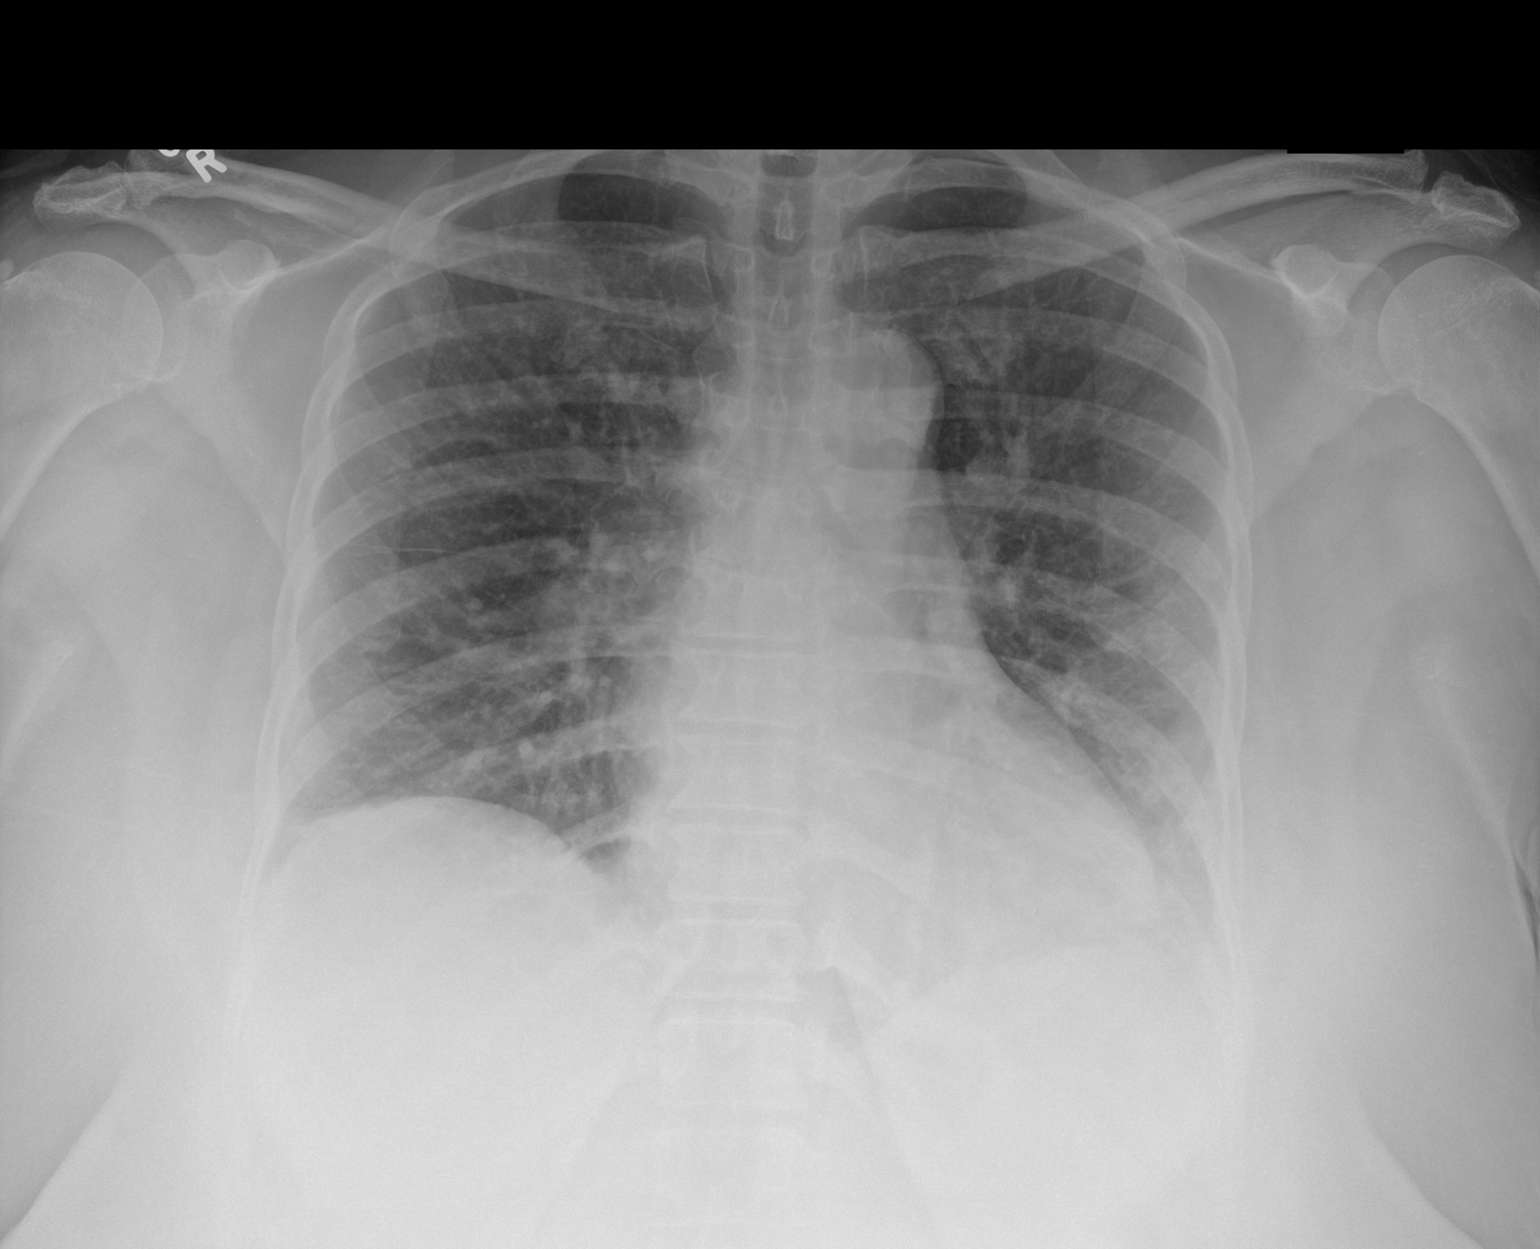

[1 of 1 positions shown; findings below may reference images not displayed]

FINDINGS: Stable cardiomediastinal silhouette with mild cardiomegaly. No
pneumothorax. No pleural effusion. Mild diffuse prominence of the
parahilar interstitial markings with faint hazy opacities in mid to
lower lungs.
IMPRESSION: Mild cardiomegaly, mild diffuse prominence of the parahilar
interstitial markings and faint hazy opacities in the mid to lower
lungs, which may represent mild congestive heart failure versus
atypical infection.

## 2021-03-24 ENCOUNTER — Other Ambulatory Visit: Payer: Self-pay | Admitting: Internal Medicine

## 2021-03-24 DIAGNOSIS — E785 Hyperlipidemia, unspecified: Secondary | ICD-10-CM

## 2021-03-24 DIAGNOSIS — E1169 Type 2 diabetes mellitus with other specified complication: Secondary | ICD-10-CM

## 2021-03-24 NOTE — Telephone Encounter (Signed)
Requested Prescriptions  Pending Prescriptions Disp Refills  . atorvastatin (LIPITOR) 20 MG tablet [Pharmacy Med Name: ATORVASTATIN 20MG  TABLETS] 30 tablet 6    Sig: TAKE 1 TABLET(20 MG) BY MOUTH DAILY     Cardiovascular:  Antilipid - Statins Failed - 03/24/2021  3:42 AM      Failed - HDL in normal range and within 360 days    HDL  Date Value Ref Range Status  09/01/2020 37 (L) >39 mg/dL Final         Passed - Total Cholesterol in normal range and within 360 days    Cholesterol, Total  Date Value Ref Range Status  09/01/2020 115 100 - 199 mg/dL Final         Passed - LDL in normal range and within 360 days    LDL Chol Calc (NIH)  Date Value Ref Range Status  09/01/2020 57 0 - 99 mg/dL Final         Passed - Triglycerides in normal range and within 360 days    Triglycerides  Date Value Ref Range Status  09/01/2020 113 0 - 149 mg/dL Final         Passed - Patient is not pregnant      Passed - Valid encounter within last 12 months    Recent Outpatient Visits          3 weeks ago Type 2 diabetes mellitus with morbid obesity (HCC)   [facility] [place], [place] B, MD   4 months ago Type 2 diabetes mellitus with morbid obesity (HCC)   [facility] [place] Pound B, MD   7 months ago Type 2 diabetes mellitus with obesity [facility])   [facility], MD   1 year ago Type 2 diabetes mellitus with obesity [facility])   [facility] [place], [place], Marzella Schlein   1 year ago Essential hypertension   [facility] [place], MD      Future Appointments            In 1 month Agbor-Etang, Storm Frisk, MD [facility], [facility]   In 3 months [facility], Laural Benes, MD [facility]

## 2021-03-25 LAB — CYTOLOGY - PAP
Comment: NEGATIVE
Diagnosis: NEGATIVE
High risk HPV: NEGATIVE

## 2021-04-02 ENCOUNTER — Encounter: Payer: Self-pay | Admitting: Orthopedic Surgery

## 2021-04-02 ENCOUNTER — Ambulatory Visit (INDEPENDENT_AMBULATORY_CARE_PROVIDER_SITE_OTHER): Payer: Medicaid Other | Admitting: Orthopedic Surgery

## 2021-04-02 DIAGNOSIS — M654 Radial styloid tenosynovitis [de Quervain]: Secondary | ICD-10-CM | POA: Diagnosis not present

## 2021-04-02 MED ORDER — LIDOCAINE HCL 1 % IJ SOLN
1.0000 mL | INTRAMUSCULAR | Status: AC | PRN
Start: 2021-04-02 — End: 2021-04-02
  Administered 2021-04-02: 1 mL

## 2021-04-02 MED ORDER — BETAMETHASONE SOD PHOS & ACET 6 (3-3) MG/ML IJ SUSP
6.0000 mg | INTRAMUSCULAR | Status: AC | PRN
Start: 2021-04-02 — End: 2021-04-02
  Administered 2021-04-02: 6 mg via INTRA_ARTICULAR

## 2021-04-02 NOTE — Progress Notes (Signed)
Office Visit Note   Patient: Jennifer Miller           Date of Birth: 1966-03-22           MRN: [id number] Visit Date: 04/02/2021              Requested by: Marcine Matar, MD [address] [place],  [place] [address] PCP: Marcine Matar, MD   Assessment & Plan: Visit Diagnoses:  1. De Quervain's tenosynovitis, left     Plan: We discussed the diagnosis, prognosis, non-operative and operative treatment options for de Quervain's tenosynovitis.  After our discussion, the patient would like to proceed with corticosteroid injection.  We reviewed the risks and benefits of conservative management.  The patient expressed understanding of the reasoning and strategy going forward.  All patient questions and concerns were addressed.    Follow-Up Instructions: No follow-ups on file.   Orders:  No orders of the defined types were placed in this encounter.  No orders of the defined types were placed in this encounter.     Procedures: Hand/UE Inj: L extensor compartment 1 for de Quervain's tenosynovitis on 04/02/2021 4:04 PM Indications: pain Details: 25 G needle, radial approach Medications: 1 mL lidocaine 1 %; 6 mg betamethasone acetate-betamethasone sodium phosphate 6 (3-3) MG/ML Outcome: tolerated well, no immediate complications Procedure, treatment alternatives, risks and benefits explained, specific risks discussed. Consent was given by the patient. Patient was prepped and draped in the usual sterile fashion.      Clinical Data: No additional findings.   Subjective: Chief Complaint  Patient presents with   Left Thumb - Pain   Right Wrist - Pain    This is a 55 yo RHD F who presents w/ left radial sided wrist pain for the last 3-4 months.  Her pain is sharp and localized to the radial aspect of the wrist around the radial styloid.  It is worse w/ certain activities that involve ulnar deviation of the wrist or extension of the thumb.  She has tried voltaren gel and  bracing without symptom improvement.    Review of Systems   Objective: Vital Signs: There were no vitals taken for this visit.  Physical Exam Constitutional:      Appearance: Normal appearance.  Cardiovascular:     Rate and Rhythm: Normal rate.     Pulses: Normal pulses.  Pulmonary:     Effort: Pulmonary effort is normal.  Skin:    General: Skin is warm and dry.     Capillary Refill: Capillary refill takes less than 2 seconds.  Neurological:     Mental Status: She is alert.    Left Hand Exam   Tenderness  Left hand tenderness location: TTP at radial styoid around 1st dorsal compartment.   Range of Motion  The patient has normal left wrist ROM.  Muscle Strength  The patient has normal left wrist strength.  Other  Erythema: absent Sensation: normal Pulse: present  Comments:  + Finkelstein test.  No pain w/ CMC grind test.       Specialty Comments:  No specialty comments available.  Imaging: No results found.   PMFS History: Patient Active Problem List   Diagnosis Date Noted   De Quervain's tenosynovitis, left 04/02/2021   Pain due to onychomycosis of toenails of both feet 10/18/2019   Type 2 diabetes mellitus with obesity (HCC) 07/24/2019   Generalized anxiety disorder 08/28/2018   Controlled type 2 diabetes mellitus without complication, without long-term current use  of insulin (HCC) 12/30/2017   Essential hypertension 12/30/2017   Iron deficiency anemia 12/30/2017   PAF (paroxysmal atrial fibrillation) (HCC) 12/30/2017   Acquired hypothyroidism 12/30/2017   History of colon polyps 12/30/2017   Hyperlipidemia 12/30/2017   Congestive heart failure (HCC) 12/30/2017   Past Medical History:  Diagnosis Date   Congestive heart failure (CHF) (HCC)    Diabetes mellitus without complication (HCC)    HLD (hyperlipidemia)    Hypertension    Paroxysmal A-fib (HCC)     Family History  Problem Relation Age of Onset   Diabetes Mother    Hypertension  Mother    Dementia Mother    Diabetes Father    Hypertension Father    Diabetes Maternal Grandmother    Diabetes Paternal Grandmother    Heart disease Paternal Grandmother     Past Surgical History:  Procedure Laterality Date   DILATION AND CURETTAGE OF UTERUS     KNEE ARTHROSCOPY AND ARTHROTOMY     Social History   Occupational History   Occupation: unemployed  Tobacco Use   Smoking status: Former   Smokeless tobacco: Never  Building services engineer Use: Never used  Substance and Sexual Activity   Alcohol use: Yes    Comment: rarely   Drug use: Never   Sexual activity: Yes    Birth control/protection: None

## 2021-04-10 ENCOUNTER — Telehealth: Payer: Self-pay

## 2021-04-10 NOTE — Telephone Encounter (Signed)
Results called

## 2021-04-10 NOTE — Telephone Encounter (Addendum)
Was able to reach out to pt via phone and make contact to review their recent ZIO monitor results. Cadence Fransico Michael, PA-C advised based on the current results   Heart monitor showed normal rhythm, rare ectopy, PVCs, no afib and no significant arrhythmias. If the patient is still having palpitations can increase Lopressor to 100mg  BID   Pt verbalized understanding, is thankful for the results call, all questions and concerns were address. Will call back for anything further, f/u as schedule on Jan 13 with Dr. Jan 15, at that time will discuss medication and increase in lopressor.

## 2021-04-17 ENCOUNTER — Other Ambulatory Visit: Payer: Self-pay | Admitting: Internal Medicine

## 2021-04-17 ENCOUNTER — Other Ambulatory Visit: Payer: Self-pay | Admitting: *Deleted

## 2021-04-17 DIAGNOSIS — E039 Hypothyroidism, unspecified: Secondary | ICD-10-CM

## 2021-04-17 MED ORDER — LISINOPRIL 40 MG PO TABS: 40.0000 mg | ORAL_TABLET | Freq: Every day | ORAL | 3 refills | Status: DC

## 2021-04-21 ENCOUNTER — Other Ambulatory Visit: Payer: Self-pay

## 2021-04-21 MED ORDER — HYDROCHLOROTHIAZIDE 50 MG PO TABS: 50.0000 mg | ORAL_TABLET | Freq: Every day | ORAL | 1 refills | Status: DC

## 2021-05-01 ENCOUNTER — Other Ambulatory Visit: Payer: Self-pay | Admitting: *Deleted

## 2021-05-01 DIAGNOSIS — I480 Paroxysmal atrial fibrillation: Secondary | ICD-10-CM

## 2021-05-01 MED ORDER — DABIGATRAN ETEXILATE MESYLATE 150 MG PO CAPS: ORAL_CAPSULE | ORAL | 1 refills | Status: DC

## 2021-05-15 ENCOUNTER — Ambulatory Visit (INDEPENDENT_AMBULATORY_CARE_PROVIDER_SITE_OTHER): Payer: Medicaid Other | Admitting: Cardiology

## 2021-05-15 ENCOUNTER — Encounter: Payer: Self-pay | Admitting: Cardiology

## 2021-05-15 ENCOUNTER — Other Ambulatory Visit: Payer: Self-pay

## 2021-05-15 VITALS — BP 100/66 | HR 76 | Ht 67.0 in | Wt 262.8 lb

## 2021-05-15 DIAGNOSIS — E7800 Pure hypercholesterolemia, unspecified: Secondary | ICD-10-CM

## 2021-05-15 DIAGNOSIS — I480 Paroxysmal atrial fibrillation: Secondary | ICD-10-CM

## 2021-05-15 DIAGNOSIS — I10 Essential (primary) hypertension: Secondary | ICD-10-CM

## 2021-05-15 NOTE — Progress Notes (Signed)
Cardiology Office Note:    Date:  05/15/2021   ID:  Jennifer Miller, DOB March 02, 1966, MRN [id number]  PCP:  Jennifer Pier, MD  Cardiologist:  Jennifer Sable, MD  Electrophysiologist:  None   Referring MD: Jennifer Pier, MD   No chief complaint on file.   History of Present Illness:    Jennifer Miller is a 56 y.o. female with a hx of hypertension, paroxysmal atrial fibrillation on Pradaxa, diabetes,who presents for follow-up.    Patient being seen due to hypertension, paroxysmal atrial fibrillation.  Complaining of palpitations after last visit.  Cardiac monitor was placed to evaluate any significant arrhythmias.  She states doing okay, eating healthier and trying to exercise more as of last week.  Prior notes Echo 03/2019 EF 55 to 60%, impaired relaxation. Patient was diagnosed with congestive heart failure about 19 years ago after having a baby.  She was fine before.  She recently moved to the area from [place].  She was placed on medications including Metroprolol tartrate, lisinopril, Lasix 40 mg every day.  She denies edema, orthopnea.  About 6 years ago, she states eating 3 hot dogs and drinking some soda while at her mother's home.  She later on noticed her heart was beating fast and irregular.  This prompted her to go to the ED.  She was evaluated and told she was in atrial fibrillation.  She was started on anticoagulation which she has taken since, currently takes Pradaxa twice daily.  She is a former smoker, denies any history of MI.  Amlodipine was stopped due to edema.  Past Medical History:  Diagnosis Date   Congestive heart failure (CHF) ([place])    Diabetes mellitus without complication (HCC)    HLD (hyperlipidemia)    Hypertension    Paroxysmal A-fib (HCC)     Past Surgical History:  Procedure Laterality Date   DILATION AND CURETTAGE OF UTERUS     KNEE ARTHROSCOPY AND ARTHROTOMY      Current Medications: Current Meds  Medication Sig   atorvastatin  (LIPITOR) 20 MG tablet TAKE 1 TABLET(20 MG) BY MOUTH DAILY   Blood Glucose Monitoring Suppl (TRUE METRIX METER) w/Device KIT Use as directed   dabigatran (PRADAXA) 150 MG CAPS capsule TAKE 1 CAPSULE(150 MG) BY MOUTH TWICE DAILY   fluticasone (FLONASE) 50 MCG/ACT nasal spray SHAKE LIQUID AND USE 1 SPRAY IN EACH NOSTRIL DAILY   glucose blood (TRUE METRIX BLOOD GLUCOSE TEST) test strip Check blood sugar fasting and before meals and again if pt feels bad (symptoms of hypo).   hydrochlorothiazide (HYDRODIURIL) 50 MG tablet Take 1 tablet (50 mg total) by mouth daily.   levothyroxine (SYNTHROID) 100 MCG tablet TAKE 1/2 TABLET BY MOUTH DAILY BEFORE BREAKFAST AS DIRECTED   lisinopril (ZESTRIL) 40 MG tablet Take 1 tablet (40 mg total) by mouth daily.   loratadine (CLARITIN) 10 MG tablet Take 1 tablet (10 mg total) by mouth daily as needed for allergies.   metFORMIN (GLUCOPHAGE) 500 MG tablet Take 1 tablet (500 mg total) by mouth 2 (two) times daily with a meal. E11.9 Diabetes type 2 controlled   metoprolol tartrate (LOPRESSOR) 50 MG tablet Take 1 tablet (50 mg total) by mouth 2 (two) times daily.   PAZEO 0.7 % SOLN as needed.    PROAIR HFA 108 (90 Base) MCG/ACT inhaler INHALE 1 TO 2 PUFFS INTO THE LUNGS EVERY 6 HOURS AS NEEDED FOR WHEEZING OR SHORTNESS OF BREATH   TRUEplus Lancets 28G MISC Check blood sugar  fasting and before meals and again if pt feels bad (symptoms of hypo).   VITAMIN D, CHOLECALCIFEROL, PO Take by mouth.     Allergies:   Patient has no known allergies.   Social History   Socioeconomic History   Marital status: Single    Spouse name: Not on file   Number of children: 3   Years of education: Not on file   Highest education level: Not on file  Occupational History   Occupation: unemployed  Tobacco Use   Smoking status: Former   Smokeless tobacco: Never  Scientific laboratory technician Use: Never used  Substance and Sexual Activity   Alcohol use: Yes    Comment: rarely   Drug use:  Never   Sexual activity: Yes    Birth control/protection: None  Other Topics Concern   Not on file  Social History Narrative   Not on file   Social Determinants of Health   Financial Resource Strain: Not on file  Food Insecurity: Not on file  Transportation Needs: Not on file  Physical Activity: Not on file  Stress: Not on file  Social Connections: Not on file     Family History: The patient's family history includes Dementia in her mother; Diabetes in her father, maternal grandmother, mother, and paternal grandmother; Heart disease in her paternal grandmother; Hypertension in her father and mother.  ROS:   Please see the history of present illness.     All other systems reviewed and are negative.  EKGs/Labs/Other Studies Reviewed:    The following studies were reviewed today:   EKG:  EKG is ordered today. EKG shows normal sinus rhythm, normal ECG.  Recent Labs: 09/01/2020: ALT 45 03/09/2021: BUN 12; Creatinine, Ser 0.80; Hemoglobin 13.2; Magnesium 1.7; Platelets 307; Potassium 4.7; Sodium 137; TSH 1.810  Recent Lipid Panel    Component Value Date/Time   CHOL 115 09/01/2020 1225   TRIG 113 09/01/2020 1225   HDL 37 (L) 09/01/2020 1225   CHOLHDL 3.1 09/01/2020 1225   LDLCALC 57 09/01/2020 1225    Physical Exam:    VS:  BP 100/66    Pulse 76    Ht '5\' 7"'  (1.702 m)    Wt 262 lb 12.8 oz (119.2 kg)    SpO2 96%    BMI 41.16 kg/m     Wt Readings from Last 3 Encounters:  05/15/21 262 lb 12.8 oz (119.2 kg)  03/17/21 260 lb (117.9 kg)  03/09/21 260 lb (117.9 kg)     GEN:  Well nourished, well developed in no acute distress HEENT: Normal NECK: No JVD; No carotid bruits CARDIAC: RRR, no murmurs, rubs, gallops RESPIRATORY:  Clear to auscultation without rales, wheezing or rhonchi  ABDOMEN: Soft, non-tender, non-distended, obese MUSCULOSKELETAL: no edema; mild right-sided chest tenderness on palpation SKIN: Warm and dry NEUROLOGIC:  Alert and oriented x 3 PSYCHIATRIC:   Normal affect   ASSESSMENT:    1. Primary hypertension   2. PAF (paroxysmal atrial fibrillation) (HCC)   3. Pure hypercholesterolemia     PLAN:    In order of problems listed above;  Hypertension, BP controlled.  Continue HCTZ 50 mg daily,  lisinopril 40 mg daily, Lopressor 50 mg twice daily.  Consider reducing HCTZ if BP stays low normal. History of paroxysmal A. fib, palpitations, cardiac monitor with no evidence of A. fib.  Currently in sinus rhythm.  Continue Lopressor 50 mg twice daily, Pradaxa. History of hyperlipidemia, cholesterol controlled continue Lipitor.  Follow-up in 3 months.  Titrate BP meds  Total encounter time 30 minutes  Greater than 50% was spent in counseling and coordination of care with the patient  Medication Adjustments/Labs and Tests Ordered: Current medicines are reviewed at length with the patient today.  Concerns regarding medicines are outlined above.  Orders Placed This Encounter  Procedures   EKG 12-Lead   No orders of the defined types were placed in this encounter.   Patient Instructions  Medication Instructions:  Your physician recommends that you continue on your current medications as directed. Please refer to the Current Medication list given to you today.  *If you need a refill on your cardiac medications before your next appointment, please call your pharmacy*   Lab Work: None ordered If you have labs (blood work) drawn today and your tests are completely normal, you will receive your results only by: [place] (if you have MyChart) OR A paper copy in the mail If you have any lab test that is abnormal or we need to change your treatment, we will call you to review the results.   Testing/Procedures: None ordered   Follow-Up: At [facility], you and your health needs are our priority.  As part of our continuing mission to provide you with exceptional heart care, we have created designated Provider Care Teams.  These  Care Teams include your primary Cardiologist (physician) and Advanced Practice Providers (APPs -  Physician Assistants and Nurse Practitioners) who all work together to provide you with the care you need, when you need it.  We recommend signing up for the patient portal called "MyChart".  Sign up information is provided on this After Visit Summary.  MyChart is used to connect with patients for Virtual Visits (Telemedicine).  Patients are able to view lab/test results, encounter notes, upcoming appointments, etc.  Non-urgent messages can be sent to your provider as well.   To learn more about what you can do with MyChart, go to [email or web address].    Your next appointment:   3 month(s)  The format for your next appointment:   In Person  Provider:   Kate Sable, MD    Other Instructions      Signed, Jennifer Sable, MD  05/15/2021 5:23 PM    [place]

## 2021-05-15 NOTE — Patient Instructions (Signed)
Medication Instructions:  Your physician recommends that you continue on your current medications as directed. Please refer to the Current Medication list given to you today.  *If you need a refill on your cardiac medications before your next appointment, please call your pharmacy*   Lab Work: None ordered If you have labs (blood work) drawn today and your tests are completely normal, you will receive your results only by: MyChart Message (if you have MyChart) OR A paper copy in the mail If you have any lab test that is abnormal or we need to change your treatment, we will call you to review the results.   Testing/Procedures: None ordered   Follow-Up: At [facility], you and your health needs are our priority.  As part of our continuing mission to provide you with exceptional heart care, we have created designated Provider Care Teams.  These Care Teams include your primary Cardiologist (physician) and Advanced Practice Providers (APPs -  Physician Assistants and Nurse Practitioners) who all work together to provide you with the care you need, when you need it.  We recommend signing up for the patient portal called "MyChart".  Sign up information is provided on this After Visit Summary.  MyChart is used to connect with patients for Virtual Visits (Telemedicine).  Patients are able to view lab/test results, encounter notes, upcoming appointments, etc.  Non-urgent messages can be sent to your provider as well.   To learn more about what you can do with MyChart, go to [email or web address].    Your next appointment:   3 month(s)  The format for your next appointment:   In Person  Provider:   Brian Agbor-Etang, MD   Other Instructions   

## 2021-05-18 ENCOUNTER — Telehealth: Payer: Self-pay | Admitting: Cardiology

## 2021-05-18 MED ORDER — HYDROCHLOROTHIAZIDE 25 MG PO TABS: 25.0000 mg | ORAL_TABLET | Freq: Every day | ORAL | 3 refills | Status: DC

## 2021-05-18 NOTE — Telephone Encounter (Signed)
Called patient and informed her of Dr. Merita Norton recommendation as documented previously. Patient verbalized understanding and agreed with plan.

## 2021-05-18 NOTE — Telephone Encounter (Signed)
Pt c/o BP issue: STAT if pt c/o blurred vision, one-sided weakness or slurred speech  1. What are your last 5 BP readings?  Friday 99/58 Saturday 93/65 Sunday 91/65  2. Are you having any other symptoms (ex. Dizziness, headache, blurred vision, passed out)? lightheaded  3. What is your BP issue? Too low

## 2021-05-18 NOTE — Telephone Encounter (Signed)
Patient returning call.

## 2021-05-18 NOTE — Telephone Encounter (Signed)
Spoke with Dr. Azucena Cecil and he recommended decreasing Hydrochlorothiazide to 25 MG once a day, and to schedule patient for a 3 month follow up.  Called patient and left a VM requesting a call back to inform her of this.

## 2021-05-25 ENCOUNTER — Other Ambulatory Visit: Payer: Self-pay | Admitting: *Deleted

## 2021-05-25 MED ORDER — METOPROLOL TARTRATE 50 MG PO TABS: 50.0000 mg | ORAL_TABLET | Freq: Two times a day (BID) | ORAL | 1 refills | Status: DC

## 2021-06-30 ENCOUNTER — Encounter: Payer: Self-pay | Admitting: Internal Medicine

## 2021-06-30 ENCOUNTER — Ambulatory Visit: Payer: Medicaid Other | Attending: Internal Medicine | Admitting: Internal Medicine

## 2021-06-30 ENCOUNTER — Other Ambulatory Visit: Payer: Self-pay

## 2021-06-30 DIAGNOSIS — I10 Essential (primary) hypertension: Secondary | ICD-10-CM

## 2021-06-30 DIAGNOSIS — E039 Hypothyroidism, unspecified: Secondary | ICD-10-CM

## 2021-06-30 DIAGNOSIS — I480 Paroxysmal atrial fibrillation: Secondary | ICD-10-CM | POA: Diagnosis not present

## 2021-06-30 DIAGNOSIS — E1169 Type 2 diabetes mellitus with other specified complication: Secondary | ICD-10-CM

## 2021-06-30 DIAGNOSIS — E785 Hyperlipidemia, unspecified: Secondary | ICD-10-CM | POA: Diagnosis not present

## 2021-06-30 LAB — GLUCOSE, POCT (MANUAL RESULT ENTRY): POC Glucose: 139 mg/dl — AB (ref 70–99)

## 2021-06-30 LAB — POCT GLYCOSYLATED HEMOGLOBIN (HGB A1C): HbA1c, POC (controlled diabetic range): 7.1 % — AB (ref 0.0–7.0)

## 2021-06-30 MED ORDER — ACCU-CHEK GUIDE W/DEVICE KIT: PACK | 0 refills | Status: AC

## 2021-06-30 MED ORDER — ACCU-CHEK SOFTCLIX LANCETS MISC: 12 refills | Status: DC

## 2021-06-30 MED ORDER — ACCU-CHEK GUIDE VI STRP: ORAL_STRIP | 12 refills | Status: DC

## 2021-06-30 NOTE — Progress Notes (Signed)
Patient ID: Jennifer Miller, female    DOB: 07-Jun-1965  MRN: [id number]  CC: chronic ds management  Subjective: Jennifer Miller is a 56 y.o. female who presents for chronic ds management Her concerns today include:  Pt with hx of CHF, a.fib, HL, HTN, hypothyroid, IDA with hx of fibroid tumors, anxiety and DM.  DIABETES TYPE 2 Last A1C:   Results for orders placed or performed in visit on 06/30/21  POCT glucose (manual entry)  Result Value Ref Range   POC Glucose 139 (A) 70 - 99 mg/dl  POCT glycosylated hemoglobin (Hb A1C)  Result Value Ref Range   Hemoglobin A1C     HbA1c POC (<> result, manual entry)     HbA1c, POC (prediabetic range)     HbA1c, POC (controlled diabetic range) 7.1 (A) 0.0 - 7.0 %    Med Adherence:  '[x]'  Yes -taking Metformin 500 mg twice a day.    Medication side effects:  '[]'  Yes    '[x]'  No Home Monitoring?  '[]'  Yes    '[]'  No Home glucose results range: Diet Adherence: started drinking more water; quit sodas.  Still drinks [employer].  Admits she loves cakes and cookies.  Wgh was 260 lbs at home without shoes.  Her goal is to get to 190 lbs Exercise: she was walking 30 mins a day outside.  She stopped walking several weeks ago after we started having a lot of rain.  Has access to gym but does not like using the gym.  Prefers to get outside and walk. Hypoglycemic episodes?: '[]'  Yes    '[]'  No Numbness of the feet? '[]'  Yes    '[x]'  No Retinopathy hx? '[]'  Yes    '[]'  No Last eye exam:  Comments:   HTN:  reports BP at home has been 120s/80s.  BP high today.  She thinks BP high today because she ate out for lunch and thinks food was salted She limits salt in foods when she cooks Took meds already today A.fib:  some palpitations at times occasionally.  She is doing okay on Pradaxa.  Denies any bruising or bleeding.  She saw the cardiologist last month.  HCTZ dose was decreased from 50 mg daily to 25 mg daily because blood pressure at that time was low. HL:  taking Lipitor.   Sometimes she gets cramps in toes but not often.  Thyroid: taking Levothyroxine consistently.  Last TSH 1.8  Had inj to wrist.  Little better.  Thumb much better  Patient Active Problem List   Diagnosis Date Noted   De Quervain's tenosynovitis, left 04/02/2021   Pain due to onychomycosis of toenails of both feet 10/18/2019   Type 2 diabetes mellitus with obesity ([place]) 07/24/2019   Generalized anxiety disorder 08/28/2018   Controlled type 2 diabetes mellitus without complication, without long-term current use of insulin ([place]) 12/30/2017   Essential hypertension 12/30/2017   Iron deficiency anemia 12/30/2017   PAF (paroxysmal atrial fibrillation) ([place]) 12/30/2017   Acquired hypothyroidism 12/30/2017   History of colon polyps 12/30/2017   Hyperlipidemia 12/30/2017   Congestive heart failure ([place]) 12/30/2017     Current Outpatient Medications on File Prior to Visit  Medication Sig Dispense Refill   atorvastatin (LIPITOR) 20 MG tablet TAKE 1 TABLET(20 MG) BY MOUTH DAILY 30 tablet 6   Blood Glucose Monitoring Suppl (TRUE METRIX METER) w/Device KIT Use as directed 1 kit 0   dabigatran (PRADAXA) 150 MG CAPS capsule TAKE 1 CAPSULE(150 MG) BY MOUTH  TWICE DAILY 180 capsule 1   fluticasone (FLONASE) 50 MCG/ACT nasal spray SHAKE LIQUID AND USE 1 SPRAY IN EACH NOSTRIL DAILY 16 g 2   glucose blood (TRUE METRIX BLOOD GLUCOSE TEST) test strip Check blood sugar fasting and before meals and again if pt feels bad (symptoms of hypo). 100 each 12   hydrochlorothiazide (HYDRODIURIL) 25 MG tablet Take 1 tablet (25 mg total) by mouth daily. 30 tablet 3   levothyroxine (SYNTHROID) 100 MCG tablet TAKE 1/2 TABLET BY MOUTH DAILY BEFORE BREAKFAST AS DIRECTED 45 tablet 0   lisinopril (ZESTRIL) 40 MG tablet Take 1 tablet (40 mg total) by mouth daily. 30 tablet 3   loratadine (CLARITIN) 10 MG tablet Take 1 tablet (10 mg total) by mouth daily as needed for allergies. 30 tablet 2   metFORMIN (GLUCOPHAGE) 500 MG tablet  Take 1 tablet (500 mg total) by mouth 2 (two) times daily with a meal. E11.9 Diabetes type 2 controlled 180 tablet 3   metoprolol tartrate (LOPRESSOR) 50 MG tablet Take 1 tablet (50 mg total) by mouth 2 (two) times daily. 180 tablet 1   PAZEO 0.7 % SOLN as needed.      PROAIR HFA 108 (90 Base) MCG/ACT inhaler INHALE 1 TO 2 PUFFS INTO THE LUNGS EVERY 6 HOURS AS NEEDED FOR WHEEZING OR SHORTNESS OF BREATH 17 g 1   TRUEplus Lancets 28G MISC Check blood sugar fasting and before meals and again if pt feels bad (symptoms of hypo). 100 each 12   VITAMIN D, CHOLECALCIFEROL, PO Take by mouth.     No current facility-administered medications on file prior to visit.    No Known Allergies  Social History   Socioeconomic History   Marital status: Single    Spouse name: Not on file   Number of children: 3   Years of education: Not on file   Highest education level: Not on file  Occupational History   Occupation: unemployed  Tobacco Use   Smoking status: Former   Smokeless tobacco: Never  Scientific laboratory technician Use: Never used  Substance and Sexual Activity   Alcohol use: Yes    Comment: rarely   Drug use: Never   Sexual activity: Yes    Birth control/protection: None  Other Topics Concern   Not on file  Social History Narrative   Not on file   Social Determinants of Health   Financial Resource Strain: Not on file  Food Insecurity: Not on file  Transportation Needs: Not on file  Physical Activity: Not on file  Stress: Not on file  Social Connections: Not on file  Intimate Partner Violence: Not on file    Family History  Problem Relation Age of Onset   Diabetes Mother    Hypertension Mother    Dementia Mother    Diabetes Father    Hypertension Father    Diabetes Maternal Grandmother    Diabetes Paternal Grandmother    Heart disease Paternal Grandmother     Past Surgical History:  Procedure Laterality Date   DILATION AND CURETTAGE OF UTERUS     KNEE ARTHROSCOPY AND  ARTHROTOMY      ROS: Review of Systems Negative except as stated above  PHYSICAL EXAM: BP (!) 142/91    Pulse 79    Resp 16    Wt 263 lb 12.8 oz (119.7 kg)    SpO2 96%    BMI 41.32 kg/m   Wt Readings from Last 3 Encounters:  06/30/21 263 lb  12.8 oz (119.7 kg)  05/15/21 262 lb 12.8 oz (119.2 kg)  03/17/21 260 lb (117.9 kg)    Physical Exam  General appearance - alert, well appearing, obese middle-age African-American female and in no distress Mental status - normal mood, behavior, speech, dress, motor activity, and thought processes Neck - supple, no significant adenopathy Chest - clear to auscultation, no wheezes, rales or rhonchi, symmetric air entry Heart - normal rate, regular rhythm, normal S1, S2, no murmurs, rubs, clicks or gallops Extremities - peripheral pulses normal, no pedal edema, no clubbing or cyanosis   CMP Latest Ref Rng & Units 03/09/2021 09/01/2020 03/03/2020  Glucose 70 - 99 mg/dL 113(H) 107(H) 109(H)  BUN 6 - 24 mg/dL '12 11 14  ' Creatinine 0.57 - 1.00 mg/dL 0.80 0.87 0.71  Sodium 134 - 144 mmol/L 137 140 137  Potassium 3.5 - 5.2 mmol/L 4.7 4.2 3.7  Chloride 96 - 106 mmol/L 99 97 98  CO2 20 - 29 mmol/L '27 25 30  ' Calcium 8.7 - 10.2 mg/dL 10.2 10.4(H) 10.0  Total Protein 6.0 - 8.5 g/dL - 7.7 -  Total Bilirubin 0.0 - 1.2 mg/dL - 0.4 -  Alkaline Phos 44 - 121 IU/L - 61 -  AST 0 - 40 IU/L - 35 -  ALT 0 - 32 IU/L - 45(H) -   Lipid Panel     Component Value Date/Time   CHOL 115 09/01/2020 1225   TRIG 113 09/01/2020 1225   HDL 37 (L) 09/01/2020 1225   CHOLHDL 3.1 09/01/2020 1225   LDLCALC 57 09/01/2020 1225    CBC    Component Value Date/Time   WBC 6.9 03/09/2021 1119   WBC 3.6 (L) 09/29/2019 0859   RBC 5.03 03/09/2021 1119   RBC 5.36 (H) 09/29/2019 0859   HGB 13.2 03/09/2021 1119   HCT 40.9 03/09/2021 1119   PLT 307 03/09/2021 1119   MCV 81 03/09/2021 1119   MCH 26.2 (L) 03/09/2021 1119   MCH 25.7 (L) 09/29/2019 0859   MCHC 32.3 03/09/2021 1119    MCHC 32.6 09/29/2019 0859   RDW 15.0 03/09/2021 1119   LYMPHSABS 1.4 09/29/2019 0859   MONOABS 0.5 09/29/2019 0859   EOSABS 0.0 09/29/2019 0859   BASOSABS 0.0 09/29/2019 0859    ASSESSMENT AND PLAN: 1. Type 2 diabetes mellitus with morbid obesity (HCC) A1c close to goal. Patient wants to hold off on making any changes in the dose of metformin.  She plans to get back to exercising.  She has a goal of trying to get her weight down this year.  Discussed and encourage healthy eating habits.  Encouraged her to make healthier choices for snacks instead of cookies and cakes. - POCT glucose (manual entry) - POCT glycosylated hemoglobin (Hb A1C) - glucose blood (ACCU-CHEK GUIDE) test strip; Check blood sugars once a day  Dispense: 100 each; Refill: 12 - Accu-Chek Softclix Lancets lancets; Check blood sugars once a day  Dispense: 100 each; Refill: 12 - Blood Glucose Monitoring Suppl (ACCU-CHEK GUIDE) w/Device KIT; UAD  Dispense: 1 kit; Refill: 0  2. Essential hypertension Not at goal.  HCTZ lowered last month by her cardiologist.  Currently on HCTZ 25 mg daily, lisinopril 40 mg daily and metoprolol 50 mg twice a day.  Patient thinks blood pressure is elevated today because she had lunch that was salted.  Advised her to check her blood pressure at least once or twice a week with goal being 130/80 or lower.  Record the readings and  bring them in to see the clinical pharmacist in 1 month.  3. Hyperlipidemia associated with type 2 diabetes mellitus (HCC) Continue atorvastatin.  Last LDL was 57.  4. Acquired hypothyroidism Continue levothyroxine at current dose.  5. PAF (paroxysmal atrial fibrillation) (HCC) Stable on Pradaxa and metoprolol.     Patient was given the opportunity to ask questions.  Patient verbalized understanding of the plan and was able to repeat key elements of the plan.   This documentation was completed using Radio producer.  Any transcriptional errors  are unintentional.  Orders Placed This Encounter  Procedures   POCT glucose (manual entry)   POCT glycosylated hemoglobin (Hb A1C)     Requested Prescriptions    No prescriptions requested or ordered in this encounter    No follow-ups on file.  Karle Plumber, MD, FACP

## 2021-06-30 NOTE — Patient Instructions (Signed)

## 2021-07-18 ENCOUNTER — Other Ambulatory Visit: Payer: Self-pay | Admitting: Internal Medicine

## 2021-07-18 DIAGNOSIS — E039 Hypothyroidism, unspecified: Secondary | ICD-10-CM

## 2021-07-20 DIAGNOSIS — E113293 Type 2 diabetes mellitus with mild nonproliferative diabetic retinopathy without macular edema, bilateral: Secondary | ICD-10-CM | POA: Diagnosis not present

## 2021-07-20 NOTE — Telephone Encounter (Signed)
Requested Prescriptions  ?Pending Prescriptions Disp Refills  ?? levothyroxine (SYNTHROID) 100 MCG tablet [Pharmacy Med Name: LEVOTHYROXINE 0.100MG  (100MCG) TAB] 45 tablet 0  ?  Sig: TAKE 1/2 TABLET BY MOUTH DAILY BEFORE BREAKFAST AS DIRECTED  ?  ? Endocrinology:  Hypothyroid Agents Passed - 07/18/2021  3:29 AM  ?  ?  Passed - TSH in normal range and within 360 days  ?  TSH  ?Date Value Ref Range Status  ?03/09/2021 1.810 0.450 - 4.500 uIU/mL Final  ?   ?  ?  Passed - Valid encounter within last 12 months  ?  Recent Outpatient Visits   ?      ? 2 weeks ago Type 2 diabetes mellitus with morbid obesity ([place])  ? [place] Ladell Pier, MD  ? 4 months ago Type 2 diabetes mellitus with morbid obesity ([place])  ? [place] Ladell Pier, MD  ? 8 months ago Type 2 diabetes mellitus with morbid obesity ([place])  ? [place] Ladell Pier, MD  ? 11 months ago Type 2 diabetes mellitus with obesity ([place])  ? [place] Karle Plumber B, MD  ? 1 year ago Type 2 diabetes mellitus with obesity ([place])  ? [place], [place], [place]  ?  ?  ?Future Appointments   ?        ? In 1 week Daisy Blossom, Jarome Matin, [place]  ? In 4 weeks Agbor-Etang, Aaron Edelman, MD [facility], [facility]  ? In 3 months Ladell Pier, MD [place]  ?  ? ?  ?  ?  ? ? ?

## 2021-07-27 ENCOUNTER — Other Ambulatory Visit: Payer: Self-pay | Admitting: Internal Medicine

## 2021-07-27 DIAGNOSIS — E039 Hypothyroidism, unspecified: Secondary | ICD-10-CM

## 2021-07-28 ENCOUNTER — Ambulatory Visit: Payer: Medicaid Other | Admitting: Pharmacist

## 2021-07-28 NOTE — Telephone Encounter (Signed)
Receipt confirmed by pharmacy 07/20/21  At 10:01 AM ? ?Requested Prescriptions  ?Pending Prescriptions Disp Refills  ?? levothyroxine (SYNTHROID) 100 MCG tablet [Pharmacy Med Name: LEVOTHYROXINE 0.100MG  ( ) TAB] 45 tablet 0  ?  Sig: TAKE 1/2 TABLET BY MOUTH DAILY BEFORE AND BREAKFAST AS DIRECTED  ?  ? Endocrinology:  Hypothyroid Agents Passed - 07/27/2021  8:00 AM  ?  ?  Passed - TSH in normal range and within 360 days  ?  TSH  ?Date Value Ref Range Status  ?03/09/2021 1.810 0.450 - 4.500 uIU/mL Final  ?   ?  ?  Passed - Valid encounter within last 12 months  ?  Recent Outpatient Visits   ?      ? 4 weeks ago Type 2 diabetes mellitus with morbid obesity (HCC)  ? [facility] Jonah Blue B, MD  ? 5 months ago Type 2 diabetes mellitus with morbid obesity (HCC)  ? [facility] Marcine Matar, MD  ? 9 months ago Type 2 diabetes mellitus with morbid obesity (HCC)  ? [facility] Marcine Matar, MD  ? 11 months ago Type 2 diabetes mellitus with obesity (HCC)  ? [place] [facility] Jonah Blue B, MD  ? 1 year ago Type 2 diabetes mellitus with obesity (HCC)  ? [facility] [place], [place], [place]  ?  ?  ?Future Appointments   ?        ? Today Lois Huxley, Cornelius Moras, RPH-CPP [facility]  ? In 2 weeks Agbor-Etang, Arlys John, MD [facility], [facility]  ? In 3 months Marcine Matar, MD [facility]  ?  ? ?  ?  ?  ? ? ?

## 2021-08-17 ENCOUNTER — Ambulatory Visit: Payer: Medicaid Other | Admitting: Cardiology

## 2021-09-02 ENCOUNTER — Encounter: Payer: Self-pay | Admitting: Internal Medicine

## 2021-09-02 DIAGNOSIS — E113299 Type 2 diabetes mellitus with mild nonproliferative diabetic retinopathy without macular edema, unspecified eye: Secondary | ICD-10-CM | POA: Insufficient documentation

## 2021-09-10 LAB — HM DIABETES EYE EXAM

## 2021-09-21 ENCOUNTER — Other Ambulatory Visit: Payer: Self-pay | Admitting: Internal Medicine

## 2021-09-21 DIAGNOSIS — E669 Obesity, unspecified: Secondary | ICD-10-CM

## 2021-09-22 ENCOUNTER — Other Ambulatory Visit: Payer: Self-pay | Admitting: *Deleted

## 2021-09-22 MED ORDER — HYDROCHLOROTHIAZIDE 25 MG PO TABS: 25.0000 mg | ORAL_TABLET | Freq: Every day | ORAL | 1 refills | Status: DC

## 2021-09-22 NOTE — Telephone Encounter (Signed)
Requested Prescriptions  Pending Prescriptions Disp Refills  . metFORMIN (GLUCOPHAGE) 500 MG tablet [Pharmacy Med Name: METFORMIN 500MG TABLETS] 180 tablet 1    Sig: TAKE 1 TABLET BY MOUTH TWICE DAILY WITH A MEAL     Endocrinology:  Diabetes - Biguanides Failed - 09/21/2021  3:30 AM      Failed - B12 Level in normal range and within 720 days    No results found for: VITAMINB12       Failed - CBC within normal limits and completed in the last 12 months    WBC  Date Value Ref Range Status  03/09/2021 6.9 3.4 - 10.8 x10E3/uL Final  09/29/2019 3.6 (L) 4.0 - 10.5 K/uL Final   RBC  Date Value Ref Range Status  03/09/2021 5.03 3.77 - 5.28 x10E6/uL Final  09/29/2019 5.36 (H) 3.87 - 5.11 MIL/uL Final   Hemoglobin  Date Value Ref Range Status  03/09/2021 13.2 11.1 - 15.9 g/dL Final   Hematocrit  Date Value Ref Range Status  03/09/2021 40.9 34.0 - 46.6 % Final   MCHC  Date Value Ref Range Status  03/09/2021 32.3 31.5 - 35.7 g/dL Final  09/29/2019 32.6 30.0 - 36.0 g/dL Final   [facility]  Date Value Ref Range Status  03/09/2021 26.2 (L) 26.6 - 33.0 pg Final  09/29/2019 25.7 (L) 26.0 - 34.0 pg Final   MCV  Date Value Ref Range Status  03/09/2021 81 79 - 97 fL Final   No results found for: PLTCOUNTKUC, LABPLAT, POCPLA RDW  Date Value Ref Range Status  03/09/2021 15.0 11.7 - 15.4 % Final         Passed - Cr in normal range and within 360 days    Creatinine, Ser  Date Value Ref Range Status  03/09/2021 0.80 0.57 - 1.00 mg/dL Final         Passed - HBA1C is between 0 and 7.9 and within 180 days    HbA1c, POC (prediabetic range)  Date Value Ref Range Status  05/12/2018 5.9 5.7 - 6.4 % Final   HbA1c, POC (controlled diabetic range)  Date Value Ref Range Status  06/30/2021 7.1 (A) 0.0 - 7.0 % Final         Passed - eGFR in normal range and within 360 days    GFR calc Af Amer  Date Value Ref Range Status  09/29/2019 >60 >60 mL/min Final   GFR, Estimated  Date Value Ref  Range Status  03/03/2020 >60 >60 mL/min Final    Comment:    (NOTE) Calculated using the CKD-EPI Creatinine Equation (2021)    eGFR  Date Value Ref Range Status  03/09/2021 88 >59 mL/min/1.73 Final         Passed - Valid encounter within last 6 months    Recent Outpatient Visits          2 months ago Type 2 diabetes mellitus with morbid obesity ([place])   [place], Neoma Laming B, MD   6 months ago Type 2 diabetes mellitus with morbid obesity [facility])   [place] Karle Plumber B, MD   10 months ago Type 2 diabetes mellitus with morbid obesity [facility])   [place] Ladell Pier, MD   1 year ago Type 2 diabetes mellitus with obesity [facility])   [place] Ladell Pier, MD   1 year ago Type 2 diabetes mellitus with obesity ([place])  [place], Dionne [place], [place]      Future Appointments            In 1 month [place] Emery Dalbert Batman, MD [place]   In 1 month Agbor-Etang, Aaron Edelman, MD [facility] [place], [facility]

## 2021-09-29 ENCOUNTER — Other Ambulatory Visit: Payer: Self-pay | Admitting: *Deleted

## 2021-09-29 MED ORDER — LISINOPRIL 40 MG PO TABS: 40.0000 mg | ORAL_TABLET | Freq: Every day | ORAL | 1 refills | Status: DC

## 2021-10-29 ENCOUNTER — Encounter: Payer: Self-pay | Admitting: Internal Medicine

## 2021-10-29 ENCOUNTER — Ambulatory Visit: Payer: Medicaid Other | Attending: Internal Medicine | Admitting: Internal Medicine

## 2021-10-29 DIAGNOSIS — E785 Hyperlipidemia, unspecified: Secondary | ICD-10-CM

## 2021-10-29 DIAGNOSIS — Z6841 Body Mass Index (BMI) 40.0 and over, adult: Secondary | ICD-10-CM | POA: Diagnosis not present

## 2021-10-29 DIAGNOSIS — E1169 Type 2 diabetes mellitus with other specified complication: Secondary | ICD-10-CM

## 2021-10-29 DIAGNOSIS — E039 Hypothyroidism, unspecified: Secondary | ICD-10-CM | POA: Diagnosis not present

## 2021-10-29 DIAGNOSIS — I10 Essential (primary) hypertension: Secondary | ICD-10-CM

## 2021-10-29 DIAGNOSIS — I480 Paroxysmal atrial fibrillation: Secondary | ICD-10-CM

## 2021-10-29 LAB — POCT GLYCOSYLATED HEMOGLOBIN (HGB A1C): HbA1c, POC (controlled diabetic range): 7.3 % — AB (ref 0.0–7.0)

## 2021-10-29 LAB — GLUCOSE, POCT (MANUAL RESULT ENTRY): POC Glucose: 112 mg/dl — AB (ref 70–99)

## 2021-10-29 MED ORDER — CHLORTHALIDONE 25 MG PO TABS: 25.0000 mg | ORAL_TABLET | Freq: Every day | ORAL | 1 refills | Status: DC

## 2021-10-29 NOTE — Progress Notes (Signed)
Patient ID: Jennifer Miller, female    DOB: 21-Jun-1965  MRN: [id number]  CC: Diabetes   Subjective: Jennifer Miller is a 56 y.o. female who presents for chronic ds management Her concerns today include:  Pt with hx of CHF, a.fib, HL, HTN, hypothyroid, IDA with hx of fibroid tumors, anxiety and DM.  DM: A1c today 7.3.  Weight is up 6 pounds from her last visit.  She attributes this to eating a lot of sweets including ice cream and not exercising as much.  She plans to do better.  She has a gym in her apartment complex.  Reports compliance with taking metformin 500 mg twice a day. HTN/A-fib/HL: Reports compliance with blood pressure medications including hydrochlorothiazide, lisinopril, and metoprolol.  No chest pains or shortness of breath.  She tries to limit salt in the foods.  She has some swelling in the lower legs.  States that she usually gets swelling in the legs and feet during the summer months. -No bruising or bleeding on the Pradaxa. Taking and tolerating atorvastatin 20 mg daily.  Last LDL was 57.  Hypothyroidism: Reports compliance with levothyroxine.  She is due for TSH level check.  Patient Active Problem List   Diagnosis Date Noted   Diabetes mellitus with background retinopathy ([place]) 09/02/2021   De Quervain's tenosynovitis, left 04/02/2021   Pain due to onychomycosis of toenails of both feet 10/18/2019   Type 2 diabetes mellitus with obesity ([place]) 07/24/2019   Generalized anxiety disorder 08/28/2018   Controlled type 2 diabetes mellitus without complication, without long-term current use of insulin ([place]) 12/30/2017   Essential hypertension 12/30/2017   Iron deficiency anemia 12/30/2017   PAF (paroxysmal atrial fibrillation) ([place]) 12/30/2017   Acquired hypothyroidism 12/30/2017   History of colon polyps 12/30/2017   Hyperlipidemia 12/30/2017   Congestive heart failure ([place]) 12/30/2017     Current Outpatient Medications on File Prior to Visit  Medication Sig Dispense  Refill   Accu-Chek Softclix Lancets lancets Check blood sugars once a day 100 each 12   atorvastatin (LIPITOR) 20 MG tablet TAKE 1 TABLET(20 MG) BY MOUTH DAILY 30 tablet 6   Blood Glucose Monitoring Suppl (ACCU-CHEK GUIDE) w/Device KIT UAD 1 kit 0   dabigatran (PRADAXA) 150 MG CAPS capsule TAKE 1 CAPSULE(150 MG) BY MOUTH TWICE DAILY 180 capsule 1   fluticasone (FLONASE) 50 MCG/ACT nasal spray SHAKE LIQUID AND USE 1 SPRAY IN EACH NOSTRIL DAILY 16 g 2   glucose blood (ACCU-CHEK GUIDE) test strip Check blood sugars once a day 100 each 12   hydrochlorothiazide (HYDRODIURIL) 25 MG tablet Take 1 tablet (25 mg total) by mouth daily. 30 tablet 1   levothyroxine (SYNTHROID) 100 MCG tablet TAKE 1/2 TABLET BY MOUTH DAILY BEFORE BREAKFAST AS DIRECTED 45 tablet 0   lisinopril (ZESTRIL) 40 MG tablet Take 1 tablet (40 mg total) by mouth daily. 30 tablet 1   loratadine (CLARITIN) 10 MG tablet Take 1 tablet (10 mg total) by mouth daily as needed for allergies. 30 tablet 2   metFORMIN (GLUCOPHAGE) 500 MG tablet TAKE 1 TABLET BY MOUTH TWICE DAILY WITH A MEAL 180 tablet 1   PAZEO 0.7 % SOLN as needed.      PROAIR HFA 108 (90 Base) MCG/ACT inhaler INHALE 1 TO 2 PUFFS INTO THE LUNGS EVERY 6 HOURS AS NEEDED FOR WHEEZING OR SHORTNESS OF BREATH 17 g 1   VITAMIN D, CHOLECALCIFEROL, PO Take by mouth.     metoprolol tartrate (LOPRESSOR) 50 MG tablet Take  1 tablet (50 mg total) by mouth 2 (two) times daily. 180 tablet 1   No current facility-administered medications on file prior to visit.    No Known Allergies  Social History   Socioeconomic History   Marital status: Single    Spouse name: Not on file   Number of children: 3   Years of education: Not on file   Highest education level: Not on file  Occupational History   Occupation: unemployed  Tobacco Use   Smoking status: Former   Smokeless tobacco: Never  Scientific laboratory technician Use: Never used  Substance and Sexual Activity   Alcohol use: Yes    Comment:  rarely   Drug use: Never   Sexual activity: Yes    Birth control/protection: None  Other Topics Concern   Not on file  Social History Narrative   Not on file   Social Determinants of Health   Financial Resource Strain: Not on file  Food Insecurity: Not on file  Transportation Needs: Not on file  Physical Activity: Not on file  Stress: Not on file  Social Connections: Not on file  Intimate Partner Violence: Not on file    Family History  Problem Relation Age of Onset   Diabetes Mother    Hypertension Mother    Dementia Mother    Diabetes Father    Hypertension Father    Diabetes Maternal Grandmother    Diabetes Paternal Grandmother    Heart disease Paternal Grandmother     Past Surgical History:  Procedure Laterality Date   DILATION AND CURETTAGE OF UTERUS     KNEE ARTHROSCOPY AND ARTHROTOMY      ROS: Review of Systems Negative except as stated above  PHYSICAL EXAM: BP 120/85   Pulse 80   Temp 98.6 F (37 C) (Oral)   Ht '5\' 7"'  (1.702 m)   Wt 269 lb 3.2 oz (122.1 kg)   SpO2 97%   BMI 42.16 kg/m   Wt Readings from Last 3 Encounters:  10/29/21 269 lb 3.2 oz (122.1 kg)  06/30/21 263 lb 12.8 oz (119.7 kg)  05/15/21 262 lb 12.8 oz (119.2 kg)    Physical Exam  General appearance - alert, well appearing, and in no distress Mental status - normal mood, behavior, speech, dress, motor activity, and thought processes Chest - clear to auscultation, no wheezes, rales or rhonchi, symmetric air entry Heart - normal rate, regular rhythm, normal S1, S2, no murmurs, rubs, clicks or gallops Extremities -trace bilateral lower extremity edema.        Latest Ref Rng & Units 03/09/2021   11:19 AM 09/01/2020   12:25 PM 03/03/2020    4:46 PM  CMP  Glucose 70 - 99 mg/dL 113  107  109   BUN 6 - 24 mg/dL '12  11  14   ' Creatinine 0.57 - 1.00 mg/dL 0.80  0.87  0.71   Sodium 134 - 144 mmol/L 137  140  137   Potassium 3.5 - 5.2 mmol/L 4.7  4.2  3.7   Chloride 96 - 106 mmol/L 99   97  98   CO2 20 - 29 mmol/L '27  25  30   ' Calcium 8.7 - 10.2 mg/dL 10.2  10.4  10.0   Total Protein 6.0 - 8.5 g/dL  7.7    Total Bilirubin 0.0 - 1.2 mg/dL  0.4    Alkaline Phos 44 - 121 IU/L  61    AST 0 - 40 IU/L  35    ALT 0 - 32 IU/L  45     Lipid Panel     Component Value Date/Time   CHOL 115 09/01/2020 1225   TRIG 113 09/01/2020 1225   HDL 37 (L) 09/01/2020 1225   CHOLHDL 3.1 09/01/2020 1225   LDLCALC 57 09/01/2020 1225    CBC    Component Value Date/Time   WBC 6.9 03/09/2021 1119   WBC 3.6 (L) 09/29/2019 0859   RBC 5.03 03/09/2021 1119   RBC 5.36 (H) 09/29/2019 0859   HGB 13.2 03/09/2021 1119   HCT 40.9 03/09/2021 1119   PLT 307 03/09/2021 1119   MCV 81 03/09/2021 1119   MCH 26.2 (L) 03/09/2021 1119   MCH 25.7 (L) 09/29/2019 0859   MCHC 32.3 03/09/2021 1119   MCHC 32.6 09/29/2019 0859   RDW 15.0 03/09/2021 1119   LYMPHSABS 1.4 09/29/2019 0859   MONOABS 0.5 09/29/2019 0859   EOSABS 0.0 09/29/2019 0859   BASOSABS 0.0 09/29/2019 0859   Results for orders placed or performed in visit on 10/29/21  POCT glucose (manual entry)  Result Value Ref Range   POC Glucose 112 (A) 70 - 99 mg/dl  POCT glycosylated hemoglobin (Hb A1C)  Result Value Ref Range   Hemoglobin A1C     HbA1c POC (<> result, manual entry)     HbA1c, POC (prediabetic range)     HbA1c, POC (controlled diabetic range) 7.3 (A) 0.0 - 7.0 %    ASSESSMENT AND PLAN: 1. Type 2 diabetes mellitus with morbid obesity ([place]) Not at goal. Dietary counseling given.  Encourage better food choices for snacks. Discussed adding Trulicity to help with better diabetes control and weight loss.  Patient declined stating that she does not want to stick herself. Discussed and encouraged the importance of regular exercise.  She plans to start exercising several days a week. - POCT glucose (manual entry) - POCT glycosylated hemoglobin (Hb A1C) - Comprehensive metabolic panel; Future  2. Essential hypertension Close to  goal.  Discussed changing HCTZ to chlorthalidone for better blood pressure control and hopefully decrease edema in the legs better.  Continue lisinopril 40 mg daily, metoprolol 25 mg twice a day. - chlorthalidone (HYGROTON) 25 MG tablet; Take 1 tablet (25 mg total) by mouth daily.  Dispense: 90 tablet; Refill: 1  3. Hyperlipidemia associated with type 2 diabetes mellitus (HCC) Continue atorvastatin - Lipid panel; Future  4. Acquired hypothyroidism Continue levothyroxine - TSH; Future  5. PAF (paroxysmal atrial fibrillation) (HCC) Sounds to be in normal rhythm on auscultation.  Continue metoprolol and Pradaxa.    Patient was given the opportunity to ask questions.  Patient verbalized understanding of the plan and was able to repeat key elements of the plan.   This documentation was completed using Radio producer.  Any transcriptional errors are unintentional.  Orders Placed This Encounter  Procedures   POCT glucose (manual entry)   POCT glycosylated hemoglobin (Hb A1C)     Requested Prescriptions    No prescriptions requested or ordered in this encounter    No follow-ups on file.  Karle Plumber, MD, FACP

## 2021-11-10 ENCOUNTER — Other Ambulatory Visit: Payer: Self-pay | Admitting: Internal Medicine

## 2021-11-10 DIAGNOSIS — E1169 Type 2 diabetes mellitus with other specified complication: Secondary | ICD-10-CM

## 2021-11-11 NOTE — Telephone Encounter (Signed)
Requested Prescriptions  Pending Prescriptions Disp Refills  . atorvastatin (LIPITOR) 20 MG tablet [Pharmacy Med Name: ATORVASTATIN 20MG  TABLETS] 90 tablet 0    Sig: TAKE 1 TABLET(20 MG) BY MOUTH DAILY     Cardiovascular:  Antilipid - Statins Failed - 11/10/2021 10:15 AM      Failed - Lipid Panel in normal range within the last 12 months    Cholesterol, Total  Date Value Ref Range Status  09/01/2020 115 100 - 199 mg/dL Final   LDL Chol Calc (NIH)  Date Value Ref Range Status  09/01/2020 57 0 - 99 mg/dL Final   HDL  Date Value Ref Range Status  09/01/2020 37 (L) >39 mg/dL Final   Triglycerides  Date Value Ref Range Status  09/01/2020 113 0 - 149 mg/dL Final         Passed - Patient is not pregnant      Passed - Valid encounter within last 12 months    Recent Outpatient Visits          1 week ago Type 2 diabetes mellitus with morbid obesity (HCC)   [facility] [place], [place] REM[place] B, MD   4 months ago Type 2 diabetes mellitus with morbid obesity (HCC)   [facility] Gavin Pound B, MD   8 months ago Type 2 diabetes mellitus with morbid obesity [facility])   [facility], MD   1 year ago Type 2 diabetes mellitus with morbid obesity [facility])   [place] [facility], MD   1 year ago Type 2 diabetes mellitus with obesity [facility])   [place] [facility], MD      Future Appointments            In 1 month Agbor-Etang, Marcine Matar, MD [facility], [facility]

## 2021-11-13 ENCOUNTER — Ambulatory Visit: Payer: Medicaid Other | Admitting: Cardiology

## 2021-11-13 DIAGNOSIS — H5213 Myopia, bilateral: Secondary | ICD-10-CM | POA: Diagnosis not present

## 2021-11-18 ENCOUNTER — Other Ambulatory Visit: Payer: Self-pay

## 2021-11-18 MED ORDER — METOPROLOL TARTRATE 50 MG PO TABS: 50.0000 mg | ORAL_TABLET | Freq: Two times a day (BID) | ORAL | 1 refills | Status: DC

## 2021-11-27 ENCOUNTER — Other Ambulatory Visit: Payer: Self-pay | Admitting: *Deleted

## 2021-12-07 ENCOUNTER — Other Ambulatory Visit: Payer: Self-pay

## 2021-12-07 MED ORDER — LISINOPRIL 40 MG PO TABS: 40.0000 mg | ORAL_TABLET | Freq: Every day | ORAL | 0 refills | Status: DC

## 2021-12-30 ENCOUNTER — Other Ambulatory Visit: Payer: Self-pay

## 2021-12-30 DIAGNOSIS — I480 Paroxysmal atrial fibrillation: Secondary | ICD-10-CM

## 2021-12-30 MED ORDER — DABIGATRAN ETEXILATE MESYLATE 150 MG PO CAPS: ORAL_CAPSULE | ORAL | 1 refills | Status: DC

## 2021-12-30 NOTE — Telephone Encounter (Signed)
Prescription refill request for Pradaxa received.  Indication: Afib  Last office visit: 05/15/21 (Agbor-Etang) Weight: 122.1kg Age: 56 Scr: 0.80 (03/09/21) CrCl: 154.109ml/min  Appropriate dose and refill sent to requested pharmacy.

## 2022-01-01 ENCOUNTER — Ambulatory Visit: Payer: Medicaid Other | Attending: Cardiology | Admitting: Cardiology

## 2022-01-01 ENCOUNTER — Encounter: Payer: Self-pay | Admitting: Cardiology

## 2022-01-01 VITALS — BP 110/76 | HR 76 | Ht 67.0 in | Wt 267.1 lb

## 2022-01-01 DIAGNOSIS — E7800 Pure hypercholesterolemia, unspecified: Secondary | ICD-10-CM | POA: Diagnosis not present

## 2022-01-01 DIAGNOSIS — I10 Essential (primary) hypertension: Secondary | ICD-10-CM

## 2022-01-01 DIAGNOSIS — I480 Paroxysmal atrial fibrillation: Secondary | ICD-10-CM

## 2022-01-01 MED ORDER — DABIGATRAN ETEXILATE MESYLATE 150 MG PO CAPS: ORAL_CAPSULE | ORAL | 3 refills | Status: DC

## 2022-01-01 NOTE — Progress Notes (Signed)
Cardiology Office Note:    Date:  01/01/2022   ID:  Jennifer Miller, DOB May 05, 1965, MRN [id number]  PCP:  Ladell Pier, MD  Cardiologist:  Kate Sable, MD  Electrophysiologist:  None   Referring MD: Ladell Pier, MD   Chief Complaint  Patient presents with   Other    3 month f/u occasional headaches. Meds reviewed verbally with pt.    History of Present Illness:    Jennifer Miller is a 56 y.o. female with a hx of hypertension, paroxysmal atrial fibrillation on Pradaxa, diabetes,who presents for follow-up.    Patient being seen due to hypertension, paroxysmal atrial fibrillation.  Compliant with medications as prescribed, takes Pradaxa for stroke prophylaxis, has rare occasions of palpitations, otherwise doing well.  Denies any bleeding issues with taking Pradaxa.  Prior notes Echo 03/2019 EF 55 to 60%, impaired relaxation. Patient was diagnosed with congestive heart failure about 19 years ago after having a baby.  She was fine before.  She recently moved to the area from [place].  She was placed on medications including Metroprolol tartrate, lisinopril, Lasix 40 mg every day.  She denies edema, orthopnea.  About 6 years ago, she states eating 3 hot dogs and drinking some soda while at her mother's home.  She later on noticed her heart was beating fast and irregular.  This prompted her to go to the ED.  She was evaluated and told she was in atrial fibrillation.  She was started on anticoagulation which she has taken since, currently takes Pradaxa twice daily.  She is a former smoker, denies any history of MI.  Amlodipine was stopped due to edema.  Past Medical History:  Diagnosis Date   Congestive heart failure (CHF) ([place])    Diabetes mellitus without complication (HCC)    HLD (hyperlipidemia)    Hypertension    Paroxysmal A-fib (HCC)     Past Surgical History:  Procedure Laterality Date   DILATION AND CURETTAGE OF UTERUS     KNEE ARTHROSCOPY AND ARTHROTOMY       Current Medications: Current Meds  Medication Sig   Accu-Chek Softclix Lancets lancets Check blood sugars once a day   atorvastatin (LIPITOR) 20 MG tablet TAKE 1 TABLET(20 MG) BY MOUTH DAILY   Blood Glucose Monitoring Suppl (ACCU-CHEK GUIDE) w/Device KIT UAD   chlorthalidone (HYGROTON) 25 MG tablet Take 1 tablet (25 mg total) by mouth daily.   fluticasone (FLONASE) 50 MCG/ACT nasal spray SHAKE LIQUID AND USE 1 SPRAY IN EACH NOSTRIL DAILY   glucose blood (ACCU-CHEK GUIDE) test strip Check blood sugars once a day   levothyroxine (SYNTHROID) 100 MCG tablet TAKE 1/2 TABLET BY MOUTH DAILY BEFORE BREAKFAST AS DIRECTED   lisinopril (ZESTRIL) 40 MG tablet Take 1 tablet (40 mg total) by mouth daily.   loratadine (CLARITIN) 10 MG tablet Take 1 tablet (10 mg total) by mouth daily as needed for allergies.   metFORMIN (GLUCOPHAGE) 500 MG tablet TAKE 1 TABLET BY MOUTH TWICE DAILY WITH A MEAL   metoprolol tartrate (LOPRESSOR) 50 MG tablet Take 1 tablet (50 mg total) by mouth 2 (two) times daily.   PROAIR HFA 108 (90 Base) MCG/ACT inhaler INHALE 1 TO 2 PUFFS INTO THE LUNGS EVERY 6 HOURS AS NEEDED FOR WHEEZING OR SHORTNESS OF BREATH   [DISCONTINUED] dabigatran (PRADAXA) 150 MG CAPS capsule TAKE 1 CAPSULE(150 MG) BY MOUTH TWICE DAILY     Allergies:   Patient has no known allergies.   Social History   Socioeconomic  History   Marital status: Single    Spouse name: Not on file   Number of children: 3   Years of education: Not on file   Highest education level: Not on file  Occupational History   Occupation: unemployed  Tobacco Use   Smoking status: Former   Smokeless tobacco: Never  Scientific laboratory technician Use: Never used  Substance and Sexual Activity   Alcohol use: Yes    Comment: rarely   Drug use: Never   Sexual activity: Yes    Birth control/protection: None  Other Topics Concern   Not on file  Social History Narrative   Not on file   Social Determinants of Health   Financial  Resource Strain: Not on file  Food Insecurity: Not on file  Transportation Needs: Not on file  Physical Activity: Not on file  Stress: Not on file  Social Connections: Not on file     Family History: The patient's family history includes Dementia in her mother; Diabetes in her father, maternal grandmother, mother, and paternal grandmother; Heart disease in her paternal grandmother; Hypertension in her father and mother.  ROS:   Please see the history of present illness.     All other systems reviewed and are negative.  EKGs/Labs/Other Studies Reviewed:    The following studies were reviewed today:   EKG:  EKG is ordered today. EKG shows normal sinus rhythm, heart rate 76.  Recent Labs: 03/09/2021: BUN 12; Creatinine, Ser 0.80; Hemoglobin 13.2; Magnesium 1.7; Platelets 307; Potassium 4.7; Sodium 137; TSH 1.810  Recent Lipid Panel    Component Value Date/Time   CHOL 115 09/01/2020 1225   TRIG 113 09/01/2020 1225   HDL 37 (L) 09/01/2020 1225   CHOLHDL 3.1 09/01/2020 1225   LDLCALC 57 09/01/2020 1225    Physical Exam:    VS:  BP 110/76 (BP Location: Left Arm, Patient Position: Sitting, Cuff Size: Large)   Pulse 76   Ht _0  (1.702 m)   Wt 267 lb 2 oz (121.2 kg)   SpO2 96%   BMI 41.84 kg/m     Wt Readings from Last 3 Encounters:  01/01/22 267 lb 2 oz (121.2 kg)  10/29/21 269 lb 3.2 oz (122.1 kg)  06/30/21 263 lb 12.8 oz (119.7 kg)     GEN:  Well nourished, well developed in no acute distress HEENT: Normal NECK: No JVD; No carotid bruits CARDIAC: RRR, no murmurs, rubs, gallops RESPIRATORY:  Clear to auscultation without rales, wheezing or rhonchi  ABDOMEN: Soft, non-tender, non-distended, obese MUSCULOSKELETAL: no edema; mild right-sided chest tenderness on palpation SKIN: Warm and dry NEUROLOGIC:  Alert and oriented x 3 PSYCHIATRIC:  Normal affect   ASSESSMENT:    1. Primary hypertension   2. PAF (paroxysmal atrial fibrillation) (HCC) Chronic  3. Pure  hypercholesterolemia     PLAN:    In order of problems listed above;  Hypertension, BP controlled.  Continue chlorthalidone 25 mg daily,  lisinopril 40 mg daily, Lopressor 50 mg twice daily.  paroxysmal A. fib, palpitations, cardiac monitor with no evidence of A. fib.  Currently in sinus rhythm.  Continue Lopressor 50 mg twice daily, Pradaxa. hyperlipidemia, cholesterol controlled, continue Lipitor 20 mg daily.  Follow-up in 1 year  Total encounter time 35 minutes  Greater than 50% was spent in counseling and coordination of care with the patient  Medication Adjustments/Labs and Tests Ordered: Current medicines are reviewed at length with the patient today.  Concerns regarding medicines are outlined  above.  Orders Placed This Encounter  Procedures   EKG 12-Lead   Meds ordered this encounter  Medications   dabigatran (PRADAXA) 150 MG CAPS capsule    Sig: TAKE 1 CAPSULE(150 MG) BY MOUTH TWICE DAILY    Dispense:  180 capsule    Refill:  3    90 day supply    Patient Instructions  Medication Instructions:   Your physician recommends that you continue on your current medications as directed. Please refer to the Current Medication list given to you today.   *If you need a refill on your cardiac medications before your next appointment, please call your pharmacy*    Follow-Up: At [facility], you and your health needs are our priority.  As part of our continuing mission to provide you with exceptional heart care, we have created designated Provider Care Teams.  These Care Teams include your primary Cardiologist (physician) and Advanced Practice Providers (APPs -  Physician Assistants and Nurse Practitioners) who all work together to provide you with the care you need, when you need it.  We recommend signing up for the patient portal called "MyChart".  Sign up information is provided on this After Visit Summary.  MyChart is used to connect with patients for Virtual  Visits (Telemedicine).  Patients are able to view lab/test results, encounter notes, upcoming appointments, etc.  Non-urgent messages can be sent to your provider as well.   To learn more about what you can do with MyChart, go to [email or web address].    Your next appointment:   1 year(s)  The format for your next appointment:   In Person  Provider:   Kate Sable, MD    Other Instructions   Important Information About Sugar         Signed, Kate Sable, MD  01/01/2022 5:11 PM    [place]

## 2022-01-01 NOTE — Patient Instructions (Signed)
Medication Instructions:   Your physician recommends that you continue on your current medications as directed. Please refer to the Current Medication list given to you today.  *If you need a refill on your cardiac medications before your next appointment, please call your pharmacy*   Follow-Up: At [facility], you and your health needs are our priority.  As part of our continuing mission to provide you with exceptional heart care, we have created designated Provider Care Teams.  These Care Teams include your primary Cardiologist (physician) and Advanced Practice Providers (APPs -  Physician Assistants and Nurse Practitioners) who all work together to provide you with the care you need, when you need it.  We recommend signing up for the patient portal called "MyChart".  Sign up information is provided on this After Visit Summary.  MyChart is used to connect with patients for Virtual Visits (Telemedicine).  Patients are able to view lab/test results, encounter notes, upcoming appointments, etc.  Non-urgent messages can be sent to your provider as well.   To learn more about what you can do with MyChart, go to [email or web address].    Your next appointment:   1 year(s)  The format for your next appointment:   In Person  Provider:   Brian Agbor-Etang, MD    Other Instructions   Important Information About Sugar       

## 2022-01-05 ENCOUNTER — Other Ambulatory Visit: Payer: Self-pay

## 2022-01-05 MED ORDER — LISINOPRIL 40 MG PO TABS: 40.0000 mg | ORAL_TABLET | Freq: Every day | ORAL | 10 refills | Status: DC

## 2022-01-06 ENCOUNTER — Other Ambulatory Visit: Payer: Self-pay

## 2022-01-06 DIAGNOSIS — I480 Paroxysmal atrial fibrillation: Secondary | ICD-10-CM

## 2022-01-07 MED ORDER — DABIGATRAN ETEXILATE MESYLATE 150 MG PO CAPS: ORAL_CAPSULE | ORAL | 1 refills | Status: DC

## 2022-01-07 NOTE — Telephone Encounter (Signed)
Prescription refill request for Xarelto received.  Indication: Afib  Last office visit: 01/01/22 (Agbor-Etang)  Weight: 121.2kg Age: 56 Scr: 0.80 (03/09/21)  CrCl: 153.4ml/min  Appropriate dose and refill sent to requested pharmacy.

## 2022-03-04 ENCOUNTER — Other Ambulatory Visit: Payer: Self-pay | Admitting: Internal Medicine

## 2022-03-04 DIAGNOSIS — E1169 Type 2 diabetes mellitus with other specified complication: Secondary | ICD-10-CM

## 2022-03-06 DIAGNOSIS — J0100 Acute maxillary sinusitis, unspecified: Secondary | ICD-10-CM | POA: Diagnosis not present

## 2022-03-06 DIAGNOSIS — R051 Acute cough: Secondary | ICD-10-CM | POA: Diagnosis not present

## 2022-03-19 ENCOUNTER — Other Ambulatory Visit: Payer: Self-pay | Admitting: Internal Medicine

## 2022-03-19 DIAGNOSIS — E669 Obesity, unspecified: Secondary | ICD-10-CM

## 2022-03-19 NOTE — Telephone Encounter (Signed)
Requested Prescriptions  Pending Prescriptions Disp Refills   metFORMIN (GLUCOPHAGE) 500 MG tablet [Pharmacy Med Name: METFORMIN 500MG TABLETS] 180 tablet 0    Sig: TAKE 1 TABLET BY MOUTH TWICE DAILY WITH A MEAL     Endocrinology:  Diabetes - Biguanides Failed - 03/19/2022  6:41 AM      Failed - Cr in normal range and within 360 days    Creatinine, Ser  Date Value Ref Range Status  03/09/2021 0.80 0.57 - 1.00 mg/dL Final         Failed - eGFR in normal range and within 360 days    GFR calc Af Amer  Date Value Ref Range Status  09/29/2019 >60 >60 mL/min Final   GFR, Estimated  Date Value Ref Range Status  03/03/2020 >60 >60 mL/min Final    Comment:    (NOTE) Calculated using the CKD-EPI Creatinine Equation (2021)    eGFR  Date Value Ref Range Status  03/09/2021 88 >59 mL/min/1.73 Final         Failed - B12 Level in normal range and within 720 days    No results found for: "VITAMINB12"       Failed - CBC within normal limits and completed in the last 12 months    WBC  Date Value Ref Range Status  03/09/2021 6.9 3.4 - 10.8 x10E3/uL Final  09/29/2019 3.6 (L) 4.0 - 10.5 K/uL Final   RBC  Date Value Ref Range Status  03/09/2021 5.03 3.77 - 5.28 x10E6/uL Final  09/29/2019 5.36 (H) 3.87 - 5.11 MIL/uL Final   Hemoglobin  Date Value Ref Range Status  03/09/2021 13.2 11.1 - 15.9 g/dL Final   Hematocrit  Date Value Ref Range Status  03/09/2021 40.9 34.0 - 46.6 % Final   MCHC  Date Value Ref Range Status  03/09/2021 32.3 31.5 - 35.7 g/dL Final  09/29/2019 32.6 30.0 - 36.0 g/dL Final   [facility]  Date Value Ref Range Status  03/09/2021 26.2 (L) 26.6 - 33.0 pg Final  09/29/2019 25.7 (L) 26.0 - 34.0 pg Final   MCV  Date Value Ref Range Status  03/09/2021 81 79 - 97 fL Final   No results found for: "PLTCOUNTKUC", "LABPLAT", "POCPLA" RDW  Date Value Ref Range Status  03/09/2021 15.0 11.7 - 15.4 % Final         Passed - HBA1C is between 0 and 7.9 and within 180 days     HbA1c, POC (prediabetic range)  Date Value Ref Range Status  05/12/2018 5.9 5.7 - 6.4 % Final   HbA1c, POC (controlled diabetic range)  Date Value Ref Range Status  10/29/2021 7.3 (A) 0.0 - 7.0 % Final         Passed - Valid encounter within last 6 months    Recent Outpatient Visits           4 months ago Type 2 diabetes mellitus with morbid obesity ([place])   [place], Neoma Laming B, MD   8 months ago Type 2 diabetes mellitus with morbid obesity ([place])   [place] Ladell Pier, MD   1 year ago Type 2 diabetes mellitus with morbid obesity [facility])   [place] Ladell Pier, MD   1 year ago Type 2 diabetes mellitus with morbid obesity [facility])   [place] Ladell Pier, MD   1 year ago Type 2 diabetes mellitus with  obesity [facility])   [facility] Ladell Pier, MD

## 2022-05-04 ENCOUNTER — Other Ambulatory Visit: Payer: Self-pay | Admitting: Internal Medicine

## 2022-05-04 DIAGNOSIS — I10 Essential (primary) hypertension: Secondary | ICD-10-CM

## 2022-06-04 ENCOUNTER — Other Ambulatory Visit: Payer: Self-pay

## 2022-06-04 MED ORDER — METOPROLOL TARTRATE 50 MG PO TABS: 50.0000 mg | ORAL_TABLET | Freq: Two times a day (BID) | ORAL | 1 refills | Status: DC

## 2022-06-10 ENCOUNTER — Other Ambulatory Visit: Payer: Self-pay | Admitting: Internal Medicine

## 2022-06-10 DIAGNOSIS — I10 Essential (primary) hypertension: Secondary | ICD-10-CM

## 2022-06-10 DIAGNOSIS — E1169 Type 2 diabetes mellitus with other specified complication: Secondary | ICD-10-CM

## 2022-06-11 ENCOUNTER — Other Ambulatory Visit: Payer: Self-pay | Admitting: Internal Medicine

## 2022-06-11 DIAGNOSIS — I10 Essential (primary) hypertension: Secondary | ICD-10-CM

## 2022-06-14 NOTE — Telephone Encounter (Signed)
Last RF 06/10/22 #90 Requested Prescriptions  Refused Prescriptions Disp Refills   chlorthalidone (HYGROTON) 25 MG tablet [Pharmacy Med Name: CHLORTHALIDONE 25MG TABLETS] 30 tablet     Sig: TAKE 1 TABLET(25 MG) BY MOUTH DAILY     Cardiovascular: Diuretics - Thiazide Failed - 06/11/2022  6:40 PM      Failed - Cr in normal range and within 180 days    Creatinine, Ser  Date Value Ref Range Status  03/09/2021 0.80 0.57 - 1.00 mg/dL Final         Failed - K in normal range and within 180 days    Potassium  Date Value Ref Range Status  03/09/2021 4.7 3.5 - 5.2 mmol/L Final         Failed - Na in normal range and within 180 days    Sodium  Date Value Ref Range Status  03/09/2021 137 134 - 144 mmol/L Final         Failed - Valid encounter within last 6 months    Recent Outpatient Visits           7 months ago Type 2 diabetes mellitus with morbid obesity ([place])   [place] Karle Plumber B, MD   11 months ago Type 2 diabetes mellitus with morbid obesity [facility])   [place] Karle Plumber B, MD   1 year ago Type 2 diabetes mellitus with morbid obesity [facility])   [place] Karle Plumber B, MD   1 year ago Type 2 diabetes mellitus with morbid obesity [facility])   [place] Karle Plumber B, MD   1 year ago Type 2 diabetes mellitus with obesity [facility])   [place], MD       Future Appointments             In 1 month Ladell Pier, MD [place] BP in normal range    BP Readings from Last 1 Encounters:  01/01/22 110/76

## 2022-06-25 ENCOUNTER — Other Ambulatory Visit: Payer: Self-pay | Admitting: Internal Medicine

## 2022-06-25 DIAGNOSIS — E669 Obesity, unspecified: Secondary | ICD-10-CM

## 2022-06-25 NOTE — Telephone Encounter (Signed)
Requested medication (s) are due for refill today: yes  Requested medication (s) are on the active medication list: yes  Last refill:  03/19/22 #180  Future visit scheduled: yes  Notes to clinic:  overdue lab work   Requested Prescriptions  Pending Prescriptions Disp Refills   metFORMIN (GLUCOPHAGE) 500 MG tablet [Pharmacy Med Name: METFORMIN '500MG'$  TABLETS] 180 tablet 0    Sig: TAKE 1 TABLET BY MOUTH TWICE DAILY WITH A MEAL     Endocrinology:  Diabetes - Biguanides Failed - 06/25/2022 10:45 AM      Failed - Cr in normal range and within 360 days    Creatinine, Ser  Date Value Ref Range Status  03/09/2021 0.80 0.57 - 1.00 mg/dL Final         Failed - HBA1C is between 0 and 7.9 and within 180 days    HbA1c, POC (prediabetic range)  Date Value Ref Range Status  05/12/2018 5.9 5.7 - 6.4 % Final   HbA1c, POC (controlled diabetic range)  Date Value Ref Range Status  10/29/2021 7.3 (A) 0.0 - 7.0 % Final         Failed - eGFR in normal range and within 360 days    GFR calc Af Amer  Date Value Ref Range Status  09/29/2019 >60 >60 mL/min Final   GFR, Estimated  Date Value Ref Range Status  03/03/2020 >60 >60 mL/min Final    Comment:    (NOTE) Calculated using the CKD-EPI Creatinine Equation (2021)    eGFR  Date Value Ref Range Status  03/09/2021 88 >59 mL/min/1.73 Final         Failed - B12 Level in normal range and within 720 days    No results found for: "VITAMINB12"       Failed - Valid encounter within last 6 months    Recent Outpatient Visits           7 months ago Type 2 diabetes mellitus with morbid obesity ([place])   [place] Karle Plumber B, MD   12 months ago Type 2 diabetes mellitus with morbid obesity [facility])   [place] Karle Plumber B, MD   1 year ago Type 2 diabetes mellitus with morbid obesity [facility])   [place] Karle Plumber B, MD    1 year ago Type 2 diabetes mellitus with morbid obesity [facility])   [place] Ladell Pier, MD   1 year ago Type 2 diabetes mellitus with obesity [facility])   [place], Deborah B, MD       Future Appointments             In 1 month Ladell Pier, MD [place]            Failed - CBC within normal limits and completed in the last 12 months    WBC  Date Value Ref Range Status  03/09/2021 6.9 3.4 - 10.8 x10E3/uL Final  09/29/2019 3.6 (L) 4.0 - 10.5 K/uL Final   RBC  Date Value Ref Range Status  03/09/2021 5.03 3.77 - 5.28 x10E6/uL Final  09/29/2019 5.36 (H) 3.87 - 5.11 MIL/uL Final   Hemoglobin  Date Value Ref Range Status  03/09/2021 13.2 11.1 - 15.9 g/dL Final   Hematocrit  Date Value Ref Range Status  03/09/2021 40.9 34.0 -  46.6 % Final   MCHC  Date Value Ref Range Status  03/09/2021 32.3 31.5 - 35.7 g/dL Final  09/29/2019 32.6 30.0 - 36.0 g/dL Final   [facility]  Date Value Ref Range Status  03/09/2021 26.2 (L) 26.6 - 33.0 pg Final  09/29/2019 25.7 (L) 26.0 - 34.0 pg Final   MCV  Date Value Ref Range Status  03/09/2021 81 79 - 97 fL Final   No results found for: "PLTCOUNTKUC", "LABPLAT", "POCPLA" RDW  Date Value Ref Range Status  03/09/2021 15.0 11.7 - 15.4 % Final

## 2022-06-25 NOTE — Telephone Encounter (Signed)
Requested medication (s) are due for refill today: signed today   Requested medication (s) are on the active medication list: yes  Last refill:  06/25/22 #60 0 refills  Future visit scheduled: yes in 1 month  Notes to clinic:   patient requesting 90 day supply. Do you want to refill for 90 days?     Requested Prescriptions  Pending Prescriptions Disp Refills   metFORMIN (GLUCOPHAGE) 500 MG tablet [Pharmacy Med Name: METFORMIN '500MG'$  TABLETS] 180 tablet     Sig: TAKE 1 TABLET(500 MG) BY MOUTH TWICE DAILY WITH A MEAL     Endocrinology:  Diabetes - Biguanides Failed - 06/25/2022  4:50 PM      Failed - Cr in normal range and within 360 days    Creatinine, Ser  Date Value Ref Range Status  03/09/2021 0.80 0.57 - 1.00 mg/dL Final         Failed - HBA1C is between 0 and 7.9 and within 180 days    HbA1c, POC (prediabetic range)  Date Value Ref Range Status  05/12/2018 5.9 5.7 - 6.4 % Final   HbA1c, POC (controlled diabetic range)  Date Value Ref Range Status  10/29/2021 7.3 (A) 0.0 - 7.0 % Final         Failed - eGFR in normal range and within 360 days    GFR calc Af Amer  Date Value Ref Range Status  09/29/2019 >60 >60 mL/min Final   GFR, Estimated  Date Value Ref Range Status  03/03/2020 >60 >60 mL/min Final    Comment:    (NOTE) Calculated using the CKD-EPI Creatinine Equation (2021)    eGFR  Date Value Ref Range Status  03/09/2021 88 >59 mL/min/1.73 Final         Failed - B12 Level in normal range and within 720 days    No results found for: "VITAMINB12"       Failed - Valid encounter within last 6 months    Recent Outpatient Visits           7 months ago Type 2 diabetes mellitus with morbid obesity ([place])   [place] Karle Plumber B, MD   12 months ago Type 2 diabetes mellitus with morbid obesity [facility])   [place] Karle Plumber B, MD   1 year ago Type 2 diabetes mellitus with  morbid obesity [facility])   [place] Karle Plumber B, MD   1 year ago Type 2 diabetes mellitus with morbid obesity [facility])   [place] Karle Plumber B, MD   1 year ago Type 2 diabetes mellitus with obesity [facility])   [place], Deborah B, MD       Future Appointments             In 1 month Ladell Pier, MD [place]            Failed - CBC within normal limits and completed in the last 12 months    WBC  Date Value Ref Range Status  03/09/2021 6.9 3.4 - 10.8 x10E3/uL Final  09/29/2019 3.6 (L) 4.0 - 10.5 K/uL Final   RBC  Date Value Ref Range Status  03/09/2021 5.03 3.77 - 5.28 x10E6/uL Final  09/29/2019 5.36 (H) 3.87 - 5.11 MIL/uL Final   Hemoglobin  Date Value Ref Range Status  03/09/2021 13.2 11.1 - 15.9 g/dL Final   Hematocrit  Date Value Ref Range Status  03/09/2021 40.9 34.0 - 46.6 % Final   MCHC  Date Value Ref Range Status  03/09/2021 32.3 31.5 - 35.7 g/dL Final  09/29/2019 32.6 30.0 - 36.0 g/dL Final   [facility]  Date Value Ref Range Status  03/09/2021 26.2 (L) 26.6 - 33.0 pg Final  09/29/2019 25.7 (L) 26.0 - 34.0 pg Final   MCV  Date Value Ref Range Status  03/09/2021 81 79 - 97 fL Final   No results found for: "PLTCOUNTKUC", "LABPLAT", "POCPLA" RDW  Date Value Ref Range Status  03/09/2021 15.0 11.7 - 15.4 % Final

## 2022-07-13 ENCOUNTER — Other Ambulatory Visit: Payer: Self-pay | Admitting: Internal Medicine

## 2022-07-13 DIAGNOSIS — E039 Hypothyroidism, unspecified: Secondary | ICD-10-CM

## 2022-07-14 NOTE — Telephone Encounter (Signed)
Requested medication (s) are due for refill today: yes  Requested medication (s) are on the active medication list: yes  Last refill:  07/20/21  Future visit scheduled: no  Notes to clinic:  Unable to refill per protocol due to failed labs, no updated TSH results.      Requested Prescriptions  Pending Prescriptions Disp Refills   levothyroxine (SYNTHROID) 100 MCG tablet [Pharmacy Med Name: LEVOTHYROXINE 0.'100MG'$  (100MCG) TAB] 15 tablet     Sig: TAKE 1/2 TABLET BY MOUTH DAILY BEFORE BREAKFAST AS DIRECTED     Endocrinology:  Hypothyroid Agents Failed - 07/13/2022  7:07 PM      Failed - TSH in normal range and within 360 days    TSH  Date Value Ref Range Status  03/09/2021 1.810 0.450 - 4.500 uIU/mL Final         Passed - Valid encounter within last 12 months    Recent Outpatient Visits           8 months ago Type 2 diabetes mellitus with morbid obesity ([place])   [place] Karle Plumber B, MD   1 year ago Type 2 diabetes mellitus with morbid obesity [facility])   [place] Karle Plumber B, MD   1 year ago Type 2 diabetes mellitus with morbid obesity [facility])   [place] Karle Plumber B, MD   1 year ago Type 2 diabetes mellitus with morbid obesity [facility])   [place] Karle Plumber B, MD   1 year ago Type 2 diabetes mellitus with obesity [facility])   [place], MD       Future Appointments             In 1 week Ladell Pier, MD [place]

## 2022-07-15 ENCOUNTER — Other Ambulatory Visit: Payer: Self-pay | Admitting: Internal Medicine

## 2022-07-15 DIAGNOSIS — E039 Hypothyroidism, unspecified: Secondary | ICD-10-CM

## 2022-07-22 DIAGNOSIS — H2513 Age-related nuclear cataract, bilateral: Secondary | ICD-10-CM | POA: Diagnosis not present

## 2022-07-22 DIAGNOSIS — E113293 Type 2 diabetes mellitus with mild nonproliferative diabetic retinopathy without macular edema, bilateral: Secondary | ICD-10-CM | POA: Diagnosis not present

## 2022-07-22 DIAGNOSIS — H43812 Vitreous degeneration, left eye: Secondary | ICD-10-CM | POA: Diagnosis not present

## 2022-07-22 LAB — HM DIABETES EYE EXAM

## 2022-07-23 ENCOUNTER — Other Ambulatory Visit: Payer: Self-pay | Admitting: *Deleted

## 2022-07-23 DIAGNOSIS — I480 Paroxysmal atrial fibrillation: Secondary | ICD-10-CM

## 2022-07-23 MED ORDER — DABIGATRAN ETEXILATE MESYLATE 150 MG PO CAPS: ORAL_CAPSULE | ORAL | 0 refills | Status: DC

## 2022-07-23 NOTE — Telephone Encounter (Signed)
Prescription refill request received for Pradaxa to be sent to the [facility] on [address]. Requesting a 3 month supply.    Hx: afib  Lov:  [place], 01/01/2022 Scr: 0.80, 03/09/2021 Weight: 121 kg  Age: 57 CrCl:160ml/min   Pt is overdue for blood work.   Called and spoke to pt who stated she only has 2 tablets left of Pradaxa. Informed pt that I would send in a 1 month supply of Pradaxa. Pt stated she will go and get blood work at the [facility] mall at [facility]. No appointment necessary and orders placed.

## 2022-07-26 ENCOUNTER — Ambulatory Visit: Payer: Medicaid Other | Attending: Internal Medicine | Admitting: Internal Medicine

## 2022-07-26 ENCOUNTER — Encounter: Payer: Self-pay | Admitting: Internal Medicine

## 2022-07-26 DIAGNOSIS — E1169 Type 2 diabetes mellitus with other specified complication: Secondary | ICD-10-CM

## 2022-07-26 DIAGNOSIS — Z114 Encounter for screening for human immunodeficiency virus [HIV]: Secondary | ICD-10-CM

## 2022-07-26 DIAGNOSIS — E785 Hyperlipidemia, unspecified: Secondary | ICD-10-CM

## 2022-07-26 DIAGNOSIS — I10 Essential (primary) hypertension: Secondary | ICD-10-CM

## 2022-07-26 DIAGNOSIS — Z1231 Encounter for screening mammogram for malignant neoplasm of breast: Secondary | ICD-10-CM | POA: Diagnosis not present

## 2022-07-26 DIAGNOSIS — I480 Paroxysmal atrial fibrillation: Secondary | ICD-10-CM

## 2022-07-26 DIAGNOSIS — Z1159 Encounter for screening for other viral diseases: Secondary | ICD-10-CM | POA: Diagnosis not present

## 2022-07-26 DIAGNOSIS — E039 Hypothyroidism, unspecified: Secondary | ICD-10-CM

## 2022-07-26 LAB — POCT GLYCOSYLATED HEMOGLOBIN (HGB A1C): HbA1c, POC (controlled diabetic range): 7.7 % — AB (ref 0.0–7.0)

## 2022-07-26 LAB — GLUCOSE, POCT (MANUAL RESULT ENTRY): POC Glucose: 138 mg/dl — AB (ref 70–99)

## 2022-07-26 MED ORDER — METFORMIN HCL 1000 MG PO TABS: 1000.0000 mg | ORAL_TABLET | Freq: Two times a day (BID) | ORAL | 6 refills | Status: DC

## 2022-07-26 MED ORDER — CHLORTHALIDONE 25 MG PO TABS: ORAL_TABLET | ORAL | 1 refills | Status: DC

## 2022-07-26 NOTE — Progress Notes (Signed)
Patient ID: Jennifer Miller, female    DOB: Jun 14, 1965  MRN: [id number]  CC: Diabetes (DM f/u. Med refills. /No to flu vax. No to shingles. Yes to mammogram.)   Subjective: Jennifer Miller is a 57 y.o. female who presents for chronic ds management Her concerns today include:  Pt with hx of CHF, a.fib, HL, HTN, hypothyroid, IDA with hx of fibroid tumors, anxiety and DM.   DM/Obesity:   Results for orders placed or performed in visit on 07/26/22  POCT glucose (manual entry)  Result Value Ref Range   POC Glucose 138 (A) 70 - 99 mg/dl  POCT glycosylated hemoglobin (Hb A1C)  Result Value Ref Range   Hemoglobin A1C     HbA1c POC (<> result, manual entry)     HbA1c, POC (prediabetic range)     HbA1c, POC (controlled diabetic range) 7.7 (A) 0.0 - 7.0 %  -taking Metformin 500 mg BID as prescribed.  Not checking BS -not doing well with eating habits.  Was eating ice cream daily with son after school; also loves candy -Does not like her wgh today -Not exercising but plans to start  HTN/A.fib:  out of Pradaxa x 5 days.  Pharmacy has been out of it but will pick up today Compliant with taking her BP meds Lisinopril 40 mg daily, Chlorthalidone 25 mg and Metoprolol 50 mg BID. -not checking BP Admits to eating salty snacks at times No chest pain/shortness of breath.  No swelling in the legs.  No bleeding on Pradaxa.  Hypothyroid:  Taking Levothyroxine as prescribed.  HM:  declines Shingrix Patient Active Problem List   Diagnosis Date Noted   Diabetes mellitus with background retinopathy ([place]) 09/02/2021   De Quervain's tenosynovitis, left 04/02/2021   Pain due to onychomycosis of toenails of both feet 10/18/2019   Type 2 diabetes mellitus with obesity ([place]) 07/24/2019   Generalized anxiety disorder 08/28/2018   Controlled type 2 diabetes mellitus without complication, without long-term current use of insulin ([place]) 12/30/2017   Essential hypertension 12/30/2017   Iron deficiency anemia  12/30/2017   PAF (paroxysmal atrial fibrillation) ([place]) 12/30/2017   Acquired hypothyroidism 12/30/2017   History of colon polyps 12/30/2017   Hyperlipidemia 12/30/2017   Congestive heart failure ([place]) 12/30/2017     Current Outpatient Medications on File Prior to Visit  Medication Sig Dispense Refill   Accu-Chek Softclix Lancets lancets Check blood sugars once a day 100 each 12   atorvastatin (LIPITOR) 20 MG tablet TAKE 1 TABLET(20 MG) BY MOUTH DAILY 90 tablet 0   Blood Glucose Monitoring Suppl (ACCU-CHEK GUIDE) w/Device KIT UAD 1 kit 0   dabigatran (PRADAXA) 150 MG CAPS capsule TAKE 1 CAPSULE(150 MG) BY MOUTH TWICE DAILY 60 capsule 0   fluticasone (FLONASE) 50 MCG/ACT nasal spray SHAKE LIQUID AND USE 1 SPRAY IN EACH NOSTRIL DAILY 16 g 2   glucose blood (ACCU-CHEK GUIDE) test strip Check blood sugars once a day 100 each 12   levothyroxine (SYNTHROID) 100 MCG tablet TAKE 1/2 TABLET BY MOUTH DAILY BEFORE BREAKFAST AS DIRECTED 45 tablet 0   lisinopril (ZESTRIL) 40 MG tablet Take 1 tablet (40 mg total) by mouth daily. 30 tablet 10   loratadine (CLARITIN) 10 MG tablet Take 1 tablet (10 mg total) by mouth daily as needed for allergies. 30 tablet 2   metoprolol tartrate (LOPRESSOR) 50 MG tablet Take 1 tablet (50 mg total) by mouth 2 (two) times daily. 180 tablet 1   PROAIR HFA 108 (90 Base) MCG/ACT  inhaler INHALE 1 TO 2 PUFFS INTO THE LUNGS EVERY 6 HOURS AS NEEDED FOR WHEEZING OR SHORTNESS OF BREATH 17 g 1   VITAMIN D, CHOLECALCIFEROL, PO Take by mouth.     No current facility-administered medications on file prior to visit.    No Known Allergies  Social History   Socioeconomic History   Marital status: Single    Spouse name: Not on file   Number of children: 3   Years of education: Not on file   Highest education level: Not on file  Occupational History   Occupation: unemployed  Tobacco Use   Smoking status: Former   Smokeless tobacco: Never  Scientific laboratory technician Use: Never used   Substance and Sexual Activity   Alcohol use: Yes    Comment: rarely   Drug use: Never   Sexual activity: Yes    Birth control/protection: None  Other Topics Concern   Not on file  Social History Narrative   Not on file   Social Determinants of Health   Financial Resource Strain: Not on file  Food Insecurity: Not on file  Transportation Needs: Not on file  Physical Activity: Not on file  Stress: Not on file  Social Connections: Not on file  Intimate Partner Violence: Not on file    Family History  Problem Relation Age of Onset   Diabetes Mother    Hypertension Mother    Dementia Mother    Diabetes Father    Hypertension Father    Diabetes Maternal Grandmother    Diabetes Paternal Grandmother    Heart disease Paternal Grandmother     Past Surgical History:  Procedure Laterality Date   DILATION AND CURETTAGE OF UTERUS     KNEE ARTHROSCOPY AND ARTHROTOMY      ROS: Review of Systems Negative except as stated above  PHYSICAL EXAM: BP 128/80   Pulse 80   Temp 98.2 F (36.8 C) (Oral)   Wt 271 lb (122.9 kg)   SpO2 98%   BMI 42.44 kg/m   Wt Readings from Last 3 Encounters:  07/26/22 271 lb (122.9 kg)  01/01/22 267 lb 2 oz (121.2 kg)  10/29/21 269 lb 3.2 oz (122.1 kg)    Physical Exam  General appearance - alert, well appearing, obese middle-age African-American female and in no distress Mental status - normal mood, behavior, speech, dress, motor activity, and thought processes Neck - supple, no significant adenopathy Chest - clear to auscultation, no wheezes, rales or rhonchi, symmetric air entry Heart - normal rate, regular rhythm, normal S1, S2, no murmurs, rubs, clicks or gallops Extremities - peripheral pulses normal, no pedal edema, no clubbing or cyanosis      Latest Ref Rng & Units 03/09/2021   11:19 AM 09/01/2020   12:25 PM 03/03/2020    4:46 PM  CMP  Glucose 70 - 99 mg/dL 113  107  109   BUN 6 - 24 mg/dL 12  11  14    Creatinine 0.57 - 1.00  mg/dL 0.80  0.87  0.71   Sodium 134 - 144 mmol/L 137  140  137   Potassium 3.5 - 5.2 mmol/L 4.7  4.2  3.7   Chloride 96 - 106 mmol/L 99  97  98   CO2 20 - 29 mmol/L 27  25  30    Calcium 8.7 - 10.2 mg/dL 10.2  10.4  10.0   Total Protein 6.0 - 8.5 g/dL  7.7    Total Bilirubin 0.0 - 1.2  mg/dL  0.4    Alkaline Phos 44 - 121 IU/L  61    AST 0 - 40 IU/L  35    ALT 0 - 32 IU/L  45     Lipid Panel     Component Value Date/Time   CHOL 115 09/01/2020 1225   TRIG 113 09/01/2020 1225   HDL 37 (L) 09/01/2020 1225   CHOLHDL 3.1 09/01/2020 1225   LDLCALC 57 09/01/2020 1225    CBC    Component Value Date/Time   WBC 6.9 03/09/2021 1119   WBC 3.6 (L) 09/29/2019 0859   RBC 5.03 03/09/2021 1119   RBC 5.36 (H) 09/29/2019 0859   HGB 13.2 03/09/2021 1119   HCT 40.9 03/09/2021 1119   PLT 307 03/09/2021 1119   MCV 81 03/09/2021 1119   MCH 26.2 (L) 03/09/2021 1119   MCH 25.7 (L) 09/29/2019 0859   MCHC 32.3 03/09/2021 1119   MCHC 32.6 09/29/2019 0859   RDW 15.0 03/09/2021 1119   LYMPHSABS 1.4 09/29/2019 0859   MONOABS 0.5 09/29/2019 0859   EOSABS 0.0 09/29/2019 0859   BASOSABS 0.0 09/29/2019 0859    ASSESSMENT AND PLAN: 1. Type 2 diabetes mellitus with morbid obesity ([place]) Not at goal. Patient with dietary indiscretions.  Dietary counseling given.  Advised to cut out the sugary snacks.  We discussed trying her with Trulicity or Ozempic.  Patient declined because she did not like the possible side effects.  So we decided to increase metformin to 1 g twice a day instead. - POCT glucose (manual entry) - POCT glycosylated hemoglobin (Hb A1C) - CBC - Comprehensive metabolic panel - Lipid panel - metFORMIN (GLUCOPHAGE) 1000 MG tablet; Take 1 tablet (1,000 mg total) by mouth 2 (two) times daily with a meal. Must keep upcoming PCP office visit for refills  Dispense: 60 tablet; Refill: 6 - Microalbumin / creatinine urine ratio  2. Essential hypertension At goal.  Continue chlorthalidone 25 mg  daily, lisinopril 40 mg daily and metoprolol 50 mg twice a day.  3. Hyperlipidemia associated with type 2 diabetes mellitus (HCC) Continue atorvastatin.  4. Acquired hypothyroidism Continue levothyroxine 100 mcg daily. - TSH  5. PAF (paroxysmal atrial fibrillation) (HCC) Continue Pradaxa  6. Encounter for screening mammogram for malignant neoplasm of breast - MM Digital Screening; Future  7. Screening for HIV (human immunodeficiency virus) - HIV antibody (with reflex)  8. Need for hepatitis C screening test - Hepatitis C Antibody     Patient was given the opportunity to ask questions.  Patient verbalized understanding of the plan and was able to repeat key elements of the plan.   This documentation was completed using Radio producer.  Any transcriptional errors are unintentional.  Orders Placed This Encounter  Procedures   MM Digital Screening   CBC   Comprehensive metabolic panel   Lipid panel   TSH   Microalbumin / creatinine urine ratio   HIV antibody (with reflex)   Hepatitis C Antibody   POCT glucose (manual entry)   POCT glycosylated hemoglobin (Hb A1C)     Requested Prescriptions   Signed Prescriptions Disp Refills   metFORMIN (GLUCOPHAGE) 1000 MG tablet 60 tablet 6    Sig: Take 1 tablet (1,000 mg total) by mouth 2 (two) times daily with a meal. Must keep upcoming PCP office visit for refills   chlorthalidone (HYGROTON) 25 MG tablet 90 tablet 1    Sig: TAKE 1 TABLET(25 MG) BY MOUTH DAILY    Return in  about 4 months (around 11/25/2022).  Karle Plumber, MD, FACP

## 2022-07-27 LAB — COMPREHENSIVE METABOLIC PANEL
ALT: 55 IU/L — ABNORMAL HIGH (ref 0–32)
AST: 35 IU/L (ref 0–40)
Albumin/Globulin Ratio: 1.4 (ref 1.2–2.2)
Albumin: 4.2 g/dL (ref 3.8–4.9)
Alkaline Phosphatase: 61 IU/L (ref 44–121)
BUN/Creatinine Ratio: 14 (ref 9–23)
BUN: 12 mg/dL (ref 6–24)
Bilirubin Total: 0.4 mg/dL (ref 0.0–1.2)
CO2: 27 mmol/L (ref 20–29)
Calcium: 10.3 mg/dL — ABNORMAL HIGH (ref 8.7–10.2)
Chloride: 99 mmol/L (ref 96–106)
Creatinine, Ser: 0.86 mg/dL (ref 0.57–1.00)
Globulin, Total: 3.1 g/dL (ref 1.5–4.5)
Glucose: 137 mg/dL — ABNORMAL HIGH (ref 70–99)
Potassium: 4.5 mmol/L (ref 3.5–5.2)
Sodium: 142 mmol/L (ref 134–144)
Total Protein: 7.3 g/dL (ref 6.0–8.5)
eGFR: 80 mL/min/{1.73_m2} (ref >59)

## 2022-07-27 LAB — LIPID PANEL
Chol/HDL Ratio: 2.7 ratio (ref 0.0–4.4)
Cholesterol, Total: 114 mg/dL (ref 100–199)
HDL: 42 mg/dL (ref >39)
LDL Chol Calc (NIH): 50 mg/dL (ref 0–99)
Triglycerides: 124 mg/dL (ref 0–149)
VLDL Cholesterol Cal: 22 mg/dL (ref 5–40)

## 2022-07-27 LAB — TSH: TSH: 1.39 u[IU]/mL (ref 0.450–4.500)

## 2022-07-27 LAB — CBC
Hematocrit: 39.4 % (ref 34.0–46.6)
Hemoglobin: 12.8 g/dL (ref 11.1–15.9)
MCH: 26.3 pg — ABNORMAL LOW (ref 26.6–33.0)
MCHC: 32.5 g/dL (ref 31.5–35.7)
MCV: 81 fL (ref 79–97)
Platelets: 320 10*3/uL (ref 150–450)
RBC: 4.87 x10E6/uL (ref 3.77–5.28)
RDW: 14.9 % (ref 11.7–15.4)
WBC: 6.8 10*3/uL (ref 3.4–10.8)

## 2022-07-27 LAB — HEPATITIS C ANTIBODY: Hep C Virus Ab: NONREACTIVE

## 2022-07-27 LAB — HIV ANTIBODY (ROUTINE TESTING W REFLEX): HIV Screen 4th Generation wRfx: NONREACTIVE

## 2022-07-28 ENCOUNTER — Telehealth: Payer: Self-pay | Admitting: Cardiology

## 2022-07-28 LAB — MICROALBUMIN / CREATININE URINE RATIO
Creatinine, Urine: 125.1 mg/dL
Microalb/Creat Ratio: 21 mg/g creat (ref 0–29)
Microalbumin, Urine: 26.2 ug/mL

## 2022-07-28 NOTE — Telephone Encounter (Signed)
Patient has been made aware that we do not have samples. The patient has been out of Pradaxa since last week.   Call placed to the pharmacy. They stated that they may get some in this Friday. They have called other pharmacies but they are out as well.

## 2022-07-28 NOTE — Telephone Encounter (Signed)
Patient calling the office for samples of medication:   1.  What medication and dosage are you requesting samples for? dabigatran (PRADAXA) 150 MG CAPS capsule  2.  Are you currently out of this medication?   Yes, patient states she has been out of medication since last week. She states her pharmacy informed her that they do not have any in stock and they do not know when they will get more.

## 2022-07-29 ENCOUNTER — Telehealth: Payer: Self-pay | Admitting: Internal Medicine

## 2022-07-29 NOTE — Telephone Encounter (Signed)
Noted  

## 2022-07-29 NOTE — Telephone Encounter (Signed)
Copied from [place] [id number]. Topic: General - Other >> Jul 29, 2022  3:19 PM Denman George T wrote: Reason for CRM: Patient returned the call for her results, stated if she doesn't answer the results can be lft on her vm.

## 2022-07-30 NOTE — Telephone Encounter (Signed)
The patient stated that she was able to get the Pradaxa. Nothing further needed

## 2022-08-02 ENCOUNTER — Encounter: Payer: Self-pay | Admitting: *Deleted

## 2022-08-04 ENCOUNTER — Other Ambulatory Visit: Payer: Self-pay

## 2022-08-23 ENCOUNTER — Other Ambulatory Visit: Payer: Self-pay | Admitting: Internal Medicine

## 2022-08-23 DIAGNOSIS — E1169 Type 2 diabetes mellitus with other specified complication: Secondary | ICD-10-CM

## 2022-08-23 MED ORDER — METFORMIN HCL 1000 MG PO TABS: 1000.0000 mg | ORAL_TABLET | Freq: Two times a day (BID) | ORAL | 1 refills | Status: DC

## 2022-08-27 ENCOUNTER — Other Ambulatory Visit: Payer: Self-pay | Admitting: *Deleted

## 2022-08-27 DIAGNOSIS — I480 Paroxysmal atrial fibrillation: Secondary | ICD-10-CM

## 2022-08-27 MED ORDER — DABIGATRAN ETEXILATE MESYLATE 150 MG PO CAPS: ORAL_CAPSULE | ORAL | 5 refills | Status: DC

## 2022-08-27 NOTE — Telephone Encounter (Signed)
Pradaxa 150mg  refill request received. Pt is 57 years old, weight-122.9kg, Crea-0.86 on 07/26/22, last seen by Dr. Azucena Cecil on 01/01/22, Diagnosis-Afib, CrCl-143.61ml/min ; Dose is appropriate based on dosing criteria. Will send in refill to requested pharmacy.

## 2022-08-30 ENCOUNTER — Other Ambulatory Visit: Payer: Self-pay

## 2022-08-30 MED ORDER — LISINOPRIL 40 MG PO TABS: 40.0000 mg | ORAL_TABLET | Freq: Every day | ORAL | 0 refills | Status: DC

## 2022-09-09 ENCOUNTER — Ambulatory Visit: Payer: Medicaid Other

## 2022-10-25 ENCOUNTER — Other Ambulatory Visit: Payer: Self-pay | Admitting: Internal Medicine

## 2022-10-25 DIAGNOSIS — E039 Hypothyroidism, unspecified: Secondary | ICD-10-CM

## 2022-10-26 NOTE — Telephone Encounter (Signed)
Requested Prescriptions  Pending Prescriptions Disp Refills   levothyroxine (SYNTHROID) 100 MCG tablet [Pharmacy Med Name: LEVOTHYROXINE 0.100MG  ( ) TAB] 45 tablet 2    Sig: TAKE 1/2 TABLET BY MOUTH DAILY BEFORE BREAKFAST AS DIRECTED     Endocrinology:  Hypothyroid Agents Passed - 10/25/2022  1:10 PM      Passed - TSH in normal range and within 360 days    TSH  Date Value Ref Range Status  07/26/2022 1.390 0.450 - 4.500 uIU/mL Final         Passed - Valid encounter within last 12 months    Recent Outpatient Visits           3 months ago Type 2 diabetes mellitus with morbid obesity (HCC)   [place] [facility] Jonah Blue B, MD   12 months ago Type 2 diabetes mellitus with morbid obesity [facility])   [place] [facility] Jonah Blue B, MD   1 year ago Type 2 diabetes mellitus with morbid obesity [facility])   [place] [facility] Jonah Blue B, MD   1 year ago Type 2 diabetes mellitus with morbid obesity [facility])   [place] [facility] Jonah Blue B, MD   1 year ago Type 2 diabetes mellitus with morbid obesity [facility])   [place] [facility] Marcine Matar, MD       Future Appointments             In 1 month Laural Benes, Binnie Rail, MD [facility]

## 2022-11-22 ENCOUNTER — Other Ambulatory Visit: Payer: Self-pay | Admitting: *Deleted

## 2022-11-22 MED ORDER — METOPROLOL TARTRATE 50 MG PO TABS: 50.0000 mg | ORAL_TABLET | Freq: Two times a day (BID) | ORAL | 0 refills | Status: DC

## 2022-11-25 ENCOUNTER — Encounter: Payer: Self-pay | Admitting: Internal Medicine

## 2022-11-25 ENCOUNTER — Ambulatory Visit: Payer: Medicaid Other | Attending: Internal Medicine | Admitting: Internal Medicine

## 2022-11-25 DIAGNOSIS — R0981 Nasal congestion: Secondary | ICD-10-CM

## 2022-11-25 DIAGNOSIS — E039 Hypothyroidism, unspecified: Secondary | ICD-10-CM

## 2022-11-25 DIAGNOSIS — I509 Heart failure, unspecified: Secondary | ICD-10-CM | POA: Diagnosis not present

## 2022-11-25 DIAGNOSIS — E1169 Type 2 diabetes mellitus with other specified complication: Secondary | ICD-10-CM

## 2022-11-25 DIAGNOSIS — Z1211 Encounter for screening for malignant neoplasm of colon: Secondary | ICD-10-CM | POA: Diagnosis not present

## 2022-11-25 DIAGNOSIS — N950 Postmenopausal bleeding: Secondary | ICD-10-CM | POA: Diagnosis not present

## 2022-11-25 LAB — GLUCOSE, POCT (MANUAL RESULT ENTRY): POC Glucose: 184 mg/dl — AB (ref 70–99)

## 2022-11-25 LAB — POCT GLYCOSYLATED HEMOGLOBIN (HGB A1C): HbA1c, POC (controlled diabetic range): 8 % — AB (ref 0.0–7.0)

## 2022-11-25 MED ORDER — METFORMIN HCL 1000 MG PO TABS
500.0000 mg | ORAL_TABLET | Freq: Two times a day (BID) | ORAL | 1 refills | Status: DC
Start: 2022-11-25 — End: 2023-05-31

## 2022-11-25 MED ORDER — FLUTICASONE PROPIONATE 50 MCG/ACT NA SUSP: 1.0000 | Freq: Every day | NASAL | 2 refills | Status: DC

## 2022-11-25 MED ORDER — LEVOTHYROXINE SODIUM 100 MCG PO TABS: ORAL_TABLET | ORAL | 2 refills | Status: DC

## 2022-11-25 MED ORDER — EMPAGLIFLOZIN 10 MG PO TABS
10.0000 mg | ORAL_TABLET | Freq: Every day | ORAL | 6 refills | Status: DC
Start: 2022-11-25 — End: 2023-03-28

## 2022-11-25 NOTE — Progress Notes (Signed)
Patient ID: Jennifer Miller, female    DOB: May 17, 1965  MRN: [id number]  CC: Diabetes (DM f/u. Med refills. )   Subjective: Jennifer Miller is a 58 y.o. female who presents for chronic ds management Her concerns today include:  Pt with hx of CHF, a.fib, HL, HTN, hypothyroid, IDA with hx of fibroid tumors, anxiety and DM.    DM/obesity: Results for orders placed or performed in visit on 11/25/22  POCT glycosylated hemoglobin (Hb A1C)  Result Value Ref Range   Hemoglobin A1C     HbA1c POC (<> result, manual entry)     HbA1c, POC (prediabetic range)     HbA1c, POC (controlled diabetic range) 8.0 (A) 0.0 - 7.0 %  POCT glucose (manual entry)  Result Value Ref Range   POC Glucose 184 (A) 70 - 99 mg/dl  -[id number] increased from last visit  Reports over eating and loves rice Suppose to be on 1 gram BID but reports the 1 gram made her feel shaking so went back to taking 500 mg BID Not checking BS consistent. Had started walking 2-3 x/wk but was not consistent.  Wgh unchanged since last visit  HTN/A.fib/CHF:  Compliant with taking her BP meds Pradaxa, Lisinopril 40 mg daily, Chlorthalidone 25 mg and Metoprolol 50 mg BID.  Occasional chest pains.  No shortness of breath.  Occasional palpitations.  A little lower extremity edema off-and-on.  No menses in 3-4 yrs but started spotting this mth on and off  Hypothyroid: taking her levothyroxine consistently.  Requests refill on Flonase for sinus congestion.  HM: Due for colon cancer screening.  Prefers Cologuard. Patient Active Problem List   Diagnosis Date Noted   Diabetes mellitus with background retinopathy (HCC) 09/02/2021   De Quervain's tenosynovitis, left 04/02/2021   Pain due to onychomycosis of toenails of both feet 10/18/2019   Type 2 diabetes mellitus with obesity (HCC) 07/24/2019   Generalized anxiety disorder 08/28/2018   Controlled type 2 diabetes mellitus without complication, without long-term current use of insulin (HCC)  12/30/2017   Essential hypertension 12/30/2017   Iron deficiency anemia 12/30/2017   PAF (paroxysmal atrial fibrillation) (HCC) 12/30/2017   Acquired hypothyroidism 12/30/2017   History of colon polyps 12/30/2017   Hyperlipidemia 12/30/2017   Congestive heart failure (HCC) 12/30/2017     Current Outpatient Medications on File Prior to Visit  Medication Sig Dispense Refill   Accu-Chek Softclix Lancets lancets CHECK BLOOD SUGARS DAILY 100 each 12   atorvastatin (LIPITOR) 20 MG tablet TAKE 1 TABLET(20 MG) BY MOUTH DAILY 90 tablet 1   Blood Glucose Monitoring Suppl (ACCU-CHEK GUIDE) w/Device KIT UAD 1 kit 0   chlorthalidone (HYGROTON) 25 MG tablet TAKE 1 TABLET(25 MG) BY MOUTH DAILY 90 tablet 1   dabigatran (PRADAXA) 150 MG CAPS capsule TAKE 1 CAPSULE(150 MG) BY MOUTH TWICE DAILY 60 capsule 5   fluticasone (FLONASE) 50 MCG/ACT nasal spray SHAKE LIQUID AND USE 1 SPRAY IN EACH NOSTRIL DAILY 16 g 2   glucose blood (ACCU-CHEK GUIDE) test strip CHECK BLOOD SUGARS ONCE DAILY 100 strip 6   levothyroxine (SYNTHROID) 100 MCG tablet TAKE 1/2 TABLET BY MOUTH DAILY BEFORE BREAKFAST AS DIRECTED 45 tablet 2   lisinopril (ZESTRIL) 40 MG tablet Take 1 tablet (40 mg total) by mouth daily. 90 tablet 0   loratadine (CLARITIN) 10 MG tablet Take 1 tablet (10 mg total) by mouth daily as needed for allergies. 30 tablet 2   metFORMIN (GLUCOPHAGE) 1000 MG tablet Take 1 tablet (1,000 mg  total) by mouth 2 (two) times daily with a meal. 180 tablet 1   metoprolol tartrate (LOPRESSOR) 50 MG tablet Take 1 tablet (50 mg total) by mouth 2 (two) times daily. 180 tablet 0   PROAIR HFA 108 (90 Base) MCG/ACT inhaler INHALE 1 TO 2 PUFFS INTO THE LUNGS EVERY 6 HOURS AS NEEDED FOR WHEEZING OR SHORTNESS OF BREATH 17 g 1   VITAMIN D, CHOLECALCIFEROL, PO Take by mouth.     No current facility-administered medications on file prior to visit.    No Known Allergies  Social History   Socioeconomic History   Marital status: Single     Spouse name: Not on file   Number of children: 3   Years of education: Not on file   Highest education level: Not on file  Occupational History   Occupation: unemployed  Tobacco Use   Smoking status: Former   Smokeless tobacco: Never  Vaping Use   Vaping status: Never Used  Substance and Sexual Activity   Alcohol use: Yes    Comment: rarely   Drug use: Never   Sexual activity: Yes    Birth control/protection: None  Other Topics Concern   Not on file  Social History Narrative   Not on file   Social Determinants of Health   Financial Resource Strain: Not on file  Food Insecurity: Not on file  Transportation Needs: Not on file  Physical Activity: Not on file  Stress: Not on file  Social Connections: Not on file  Intimate Partner Violence: Not on file    Family History  Problem Relation Age of Onset   Diabetes Mother    Hypertension Mother    Dementia Mother    Diabetes Father    Hypertension Father    Diabetes Maternal Grandmother    Diabetes Paternal Grandmother    Heart disease Paternal Grandmother     Past Surgical History:  Procedure Laterality Date   DILATION AND CURETTAGE OF UTERUS     KNEE ARTHROSCOPY AND ARTHROTOMY      ROS: Review of Systems Negative except as stated above  PHYSICAL EXAM: BP 118/76 (BP Location: Left Arm, Patient Position: Sitting, Cuff Size: Large)   Pulse 80   Temp 98.3 F (36.8 C) (Oral)   Ht 5\' 7"  (1.702 m)   Wt 270 lb 12.8 oz (122.8 kg)   SpO2 98%   BMI 42.41 kg/m   Wt Readings from Last 3 Encounters:  11/25/22 270 lb 12.8 oz (122.8 kg)  07/26/22 271 lb (122.9 kg)  01/01/22 267 lb 2 oz (121.2 kg)    Physical Exam  General appearance - alert, well appearing, middle-aged African-American female and in no distress Mental status - normal mood, behavior, speech, dress, motor activity, and thought processes Neck - supple, no significant adenopathy Chest - clear to auscultation, no wheezes, rales or rhonchi,  symmetric air entry Heart - normal rate, regular rhythm, normal S1, S2, no murmurs, rubs, clicks or gallops Extremities - peripheral pulses normal, no pedal edema, no clubbing or cyanosis      Latest Ref Rng & Units 07/26/2022    4:51 PM 03/09/2021   11:19 AM 09/01/2020   12:25 PM  CMP  Glucose 70 - 99 mg/dL 478  295  621   BUN 6 - 24 mg/dL 12  12  11    Creatinine 0.57 - 1.00 mg/dL 3.08  6.57  8.46   Sodium 134 - 144 mmol/L 142  137  140   Potassium 3.5 -  5.2 mmol/L 4.5  4.7  4.2   Chloride 96 - 106 mmol/L 99  99  97   CO2 20 - 29 mmol/L 27  27  25    Calcium 8.7 - 10.2 mg/dL 91.4  78.2  95.6   Total Protein 6.0 - 8.5 g/dL 7.3   7.7   Total Bilirubin 0.0 - 1.2 mg/dL 0.4   0.4   Alkaline Phos 44 - 121 IU/L 61   61   AST 0 - 40 IU/L 35   35   ALT 0 - 32 IU/L 55   45    Lipid Panel     Component Value Date/Time   CHOL 114 07/26/2022 1651   TRIG 124 07/26/2022 1651   HDL 42 07/26/2022 1651   CHOLHDL 2.7 07/26/2022 1651   LDLCALC 50 07/26/2022 1651    CBC    Component Value Date/Time   WBC 6.8 07/26/2022 1651   WBC 3.6 (L) 09/29/2019 0859   RBC 4.87 07/26/2022 1651   RBC 5.36 (H) 09/29/2019 0859   HGB 12.8 07/26/2022 1651   HCT 39.4 07/26/2022 1651   PLT 320 07/26/2022 1651   MCV 81 07/26/2022 1651   MCH 26.3 (L) 07/26/2022 1651   MCH 25.7 (L) 09/29/2019 0859   MCHC 32.5 07/26/2022 1651   MCHC 32.6 09/29/2019 0859   RDW 14.9 07/26/2022 1651   LYMPHSABS 1.4 09/29/2019 0859   MONOABS 0.5 09/29/2019 0859   EOSABS 0.0 09/29/2019 0859   BASOSABS 0.0 09/29/2019 0859    ASSESSMENT AND PLAN: 1. Type 2 diabetes mellitus with morbid obesity (HCC) Not at goal. Dietary counseling given.  Encouraged her to cut back on white carbs especially the rice.  Encouraged her to try to get in some form of moderate intensity exercise several days a week. Change metformin to 500 mg twice a day. Add Jardiance 10 mg daily.  Advise she will notice increased urination with the Jardiance.   Advised to stay hydrated by drinking several glasses of water daily.  If she develops any vaginal yeast infection, she should stop the medicine and let me know. - POCT glycosylated hemoglobin (Hb A1C) - POCT glucose (manual entry) - empagliflozin (JARDIANCE) 10 MG TABS tablet; Take 1 tablet (10 mg total) by mouth daily before breakfast.  Dispense: 30 tablet; Refill: 6 - Basic Metabolic Panel; Future  2. Acquired hypothyroidism Last TSH was 1.39.  Continue levothyroxine 100 mcg daily - levothyroxine (SYNTHROID) 100 MCG tablet; TAKE 1/2 TABLET BY MOUTH DAILY BEFORE BREAKFAST AS DIRECTED  Dispense: 45 tablet; Refill: 2 - TSH; Future  3. Sinus congestion - fluticasone (FLONASE) 50 MCG/ACT nasal spray; Place 1 spray into both nostrils daily.  Dispense: 16 g; Refill: 2  4. Chronic congestive heart failure, unspecified heart failure type (HCC) Stable and compensated.  Continue lisinopril 40 mg daily, metoprolol 50 mg twice a day and chlorthalidone 25 mg daily.  5. Postmenopausal bleeding - Ambulatory referral to Gynecology - US Pelvic Complete With Transvaginal; Future  6. Screening for colon cancer - Cologuard     Patient was given the opportunity to ask questions.  Patient verbalized understanding of the plan and was able to repeat key elements of the plan.   This documentation was completed using Paediatric nurse.  Any transcriptional errors are unintentional.  Orders Placed This Encounter  Procedures   POCT glycosylated hemoglobin (Hb A1C)   POCT glucose (manual entry)     Requested Prescriptions   Pending Prescriptions Disp Refills  levothyroxine (SYNTHROID) 100 MCG tablet 45 tablet 2    Sig: TAKE 1/2 TABLET BY MOUTH DAILY BEFORE BREAKFAST AS DIRECTED   fluticasone (FLONASE) 50 MCG/ACT nasal spray 16 g 2    No follow-ups on file.  Jonah Blue, MD, FACP

## 2022-11-26 ENCOUNTER — Telehealth: Payer: Self-pay

## 2022-11-26 NOTE — Telephone Encounter (Signed)
A prior authorization request for Jennifer Miller was submitted to insurance today via CoverMyMeds Key: [id number]

## 2022-11-29 ENCOUNTER — Other Ambulatory Visit: Payer: Self-pay

## 2022-11-29 ENCOUNTER — Telehealth: Payer: Self-pay | Admitting: Cardiology

## 2022-11-29 NOTE — Telephone Encounter (Signed)
Error

## 2022-12-08 ENCOUNTER — Ambulatory Visit (INDEPENDENT_AMBULATORY_CARE_PROVIDER_SITE_OTHER): Payer: Medicaid Other | Admitting: Family Medicine

## 2022-12-08 ENCOUNTER — Other Ambulatory Visit (HOSPITAL_COMMUNITY)
Admission: RE | Admit: 2022-12-08 | Discharge: 2022-12-08 | Disposition: A | Discharge status: Home / Self Care | Payer: Medicaid Other | Source: Ambulatory Visit | Attending: Family Medicine | Admitting: Family Medicine

## 2022-12-08 ENCOUNTER — Encounter: Payer: Self-pay | Admitting: Family Medicine

## 2022-12-08 VITALS — BP 113/78 | HR 73 | Ht 67.0 in | Wt 267.2 lb

## 2022-12-08 DIAGNOSIS — N950 Postmenopausal bleeding: Secondary | ICD-10-CM | POA: Insufficient documentation

## 2022-12-08 NOTE — Progress Notes (Signed)
CC: referral stating spotting  PCP did order pelvic complete  This month on and off

## 2022-12-08 NOTE — Progress Notes (Signed)
    Subjective:    Patient ID: Jennifer Miller is a 57 y.o. female presenting with New GYN   on 12/08/2022  HPI: No cycle since 2020 when she had a cervical polypectomy. Then developed some spotting in earlier July for a few days. Has since resolved. Noted some vaginal odor.   Review of Systems  Constitutional:  Negative for chills and fever.  Respiratory:  Negative for shortness of breath.   Cardiovascular:  Negative for chest pain.  Gastrointestinal:  Negative for abdominal pain, nausea and vomiting.  Genitourinary:  Negative for dysuria.  Skin:  Negative for rash.      Objective:    BP 113/78   Pulse 73   Ht 5\' 7"  (1.702 m)   Wt 267 lb 3.2 oz (121.2 kg)   BMI 41.85 kg/m  Physical Exam Exam conducted with a chaperone present.  Constitutional:      General: She is not in acute distress.    Appearance: She is well-developed.  HENT:     Head: Normocephalic and atraumatic.  Eyes:     General: No scleral icterus. Cardiovascular:     Rate and Rhythm: Normal rate.  Pulmonary:     Effort: Pulmonary effort is normal.  Abdominal:     Palpations: Abdomen is soft.  Genitourinary:    Comments: BUS normal, vagina is pink and rugated, cervix is parous without lesion  Musculoskeletal:     Cervical back: Neck supple.  Skin:    General: Skin is warm and dry.  Neurological:     Mental Status: She is alert and oriented to person, place, and time.    Procedure: Patient given informed consent, signed copy in the chart, time out was performed. Appropriate time out taken. . The patient was placed in the lithotomy position and the cervix brought into view with sterile speculum.  Portio of cervix cleansed x 2 with betadine swabs.  A tenaculum was placed in the anterior lip of the cervix.  The uterus was sounded for depth of 8 cm. A pipelle was introduced to into the uterus, suction created,  and an endometrial sample was obtained. All equipment was removed and accounted for.  The patient  tolerated the procedure well.      Assessment & Plan:   Problem List Items Addressed This Visit       Unprioritized   Postmenopausal bleeding - Primary    Given menopausal status, needs sampling. Done today. Will get pelvic sonogram scheduled.      Relevant Orders   Surgical pathology( [facility]/ [facility])    Return in about 4 weeks (around 01/05/2023) for a follow-up.  Reva Bores, MD 12/08/2022 9:44 AM

## 2022-12-08 NOTE — Assessment & Plan Note (Signed)
Given menopausal status, needs sampling. Done today. Will get pelvic sonogram scheduled.

## 2022-12-09 ENCOUNTER — Ambulatory Visit: Payer: Medicaid Other | Attending: Family Medicine

## 2022-12-09 DIAGNOSIS — E1169 Type 2 diabetes mellitus with other specified complication: Secondary | ICD-10-CM | POA: Diagnosis not present

## 2022-12-09 DIAGNOSIS — E039 Hypothyroidism, unspecified: Secondary | ICD-10-CM

## 2023-01-14 ENCOUNTER — Ambulatory Visit: Payer: Medicaid Other | Attending: Cardiology | Admitting: Cardiology

## 2023-01-14 ENCOUNTER — Encounter: Payer: Self-pay | Admitting: Cardiology

## 2023-01-14 VITALS — BP 120/74 | HR 83 | Ht 67.0 in | Wt 255.6 lb

## 2023-01-14 DIAGNOSIS — E1169 Type 2 diabetes mellitus with other specified complication: Secondary | ICD-10-CM | POA: Diagnosis not present

## 2023-01-14 DIAGNOSIS — E669 Obesity, unspecified: Secondary | ICD-10-CM

## 2023-01-14 DIAGNOSIS — Z7984 Long term (current) use of oral hypoglycemic drugs: Secondary | ICD-10-CM

## 2023-01-14 DIAGNOSIS — I10 Essential (primary) hypertension: Secondary | ICD-10-CM

## 2023-01-14 DIAGNOSIS — I480 Paroxysmal atrial fibrillation: Secondary | ICD-10-CM

## 2023-01-14 DIAGNOSIS — E782 Mixed hyperlipidemia: Secondary | ICD-10-CM

## 2023-01-14 MED ORDER — DABIGATRAN ETEXILATE MESYLATE 150 MG PO CAPS
ORAL_CAPSULE | ORAL | 3 refills | Status: DC
Start: 2023-01-14 — End: 2024-02-15

## 2023-01-14 NOTE — Progress Notes (Signed)
Cardiology Office Note:  .   Date:  01/14/2023  ID:  Jennifer Miller, DOB 06/30/1965, MRN [id number] PCP: Marcine Matar, MD  [facility] Providers Cardiologist:  Debbe Odea, MD    History of Present Illness: .   Jennifer Miller is a 57 y.o. female with a past medical history of primary hypertension, paroxysmal atrial fibrillation on oral anticoagulant and Pradaxa, pure hypercholesterolemia, and diabetes, acquired hypothyroidism, iron deficiency anemia who presents today for follow-up.  Prior echocardiogram completed on 03/2019 revealed an EF 55 to 60%, impaired relaxation, patient was diagnosed with congestive heart failure about 19 years ago after the birth of her child.  She recently moved into the area from [place].  And she has been tolerating medications including metoprolol titrate, lisinopril, and Lasix.  She said approximately 6 years prior to her initial visit in clinic she was diagnosed with atrial fibrillation when she had presented to the emergency department with palpitations and shortness of breath.  She was last seen in clinic 01/01/2022 by Dr.Agbor-Etang.  At that time she was having occasional headaches.  But was taking her medication as prescribed with any note of bleeding with her OAC.  Blood pressure remained stable and she had continued on her current medication regimen.  She had had palpitations with previous heart monitor that revealed no evidence of atrial fibrillation.  There were no further testing that was ordered at that time.  She returns to clinic today stating that she has been having chest discomfort related to stress off and on for the last month as her sister passed and there were issues with cost related to the funeral.  Denies any shortness of breath, headedness dizziness, or peripheral edema.  Denies any PND and orthopnea or early satiety She has had no episodes of atrial fibrillation.  Blood pressure has been well-controlled.  She continues to walk  several miles daily and is continuing to lose weight.  Denies any bleeding and is continued on Pradaxa without incident.  Denies any hospitalizations or visits to the emergency department.  ROS: 10 point review of systems has been reviewed and considered negative with exception was been listed in the HPI  Studies Reviewed: Marland Kitchen   EKG Interpretation Date/Time:  Friday January 14 2023 15:52:55 EDT Ventricular Rate:  83 PR Interval:  130 QRS Duration:  90 QT Interval:  360 QTC Calculation: 423 R Axis:   -7  Text Interpretation: Normal sinus rhythm Low voltage QRS When compared with ECG of 29-Sep-2019 09:13, Confirmed by Charlsie Quest ([id number]) on 01/14/2023 4:06:29 PM   Heart Monitor 04/09/21 Patient had a min HR of 54 bpm, max HR of 154 bpm, and avg HR of 83 bpm. Predominant underlying rhythm was Sinus Rhythm. Isolated SVEs were rare (<1.0%), SVE Couplets were rare (<1.0%), and no SVE Triplets were present. Isolated VEs were rare (<1.0%), VE  Couplets were rare (<1.0%), and no VE Triplets were present. Ventricular Bigeminy and Trigeminy were present.    No significant arrhythmias noted on cardiac monitor, no evidence for atrial fibrillation.  TTE 03/13/19 1. Left ventricular ejection fraction, by visual estimation, is 55 to  60%. The left ventricle has normal function. There is mildly increased  left ventricular hypertrophy.   2. Left ventricular diastolic parameters are consistent with Grade I  diastolic dysfunction (impaired relaxation).   3. The left ventricle has no regional wall motion abnormalities.   4. Global right ventricle has normal systolic function.The right  ventricular size is mildly enlarged. No  increase in right ventricular wall  thickness.   5. Pulmonary artery pressure is upper normal (25-30 mmHg plus central  venous pressure).   6. Left atrial size was normal.   7. Right atrial size was normal.   8. The pericardium was not well visualized.   9. The mitral valve is  normal in structure. Trace mitral valve  regurgitation.  10. The tricuspid valve is grossly normal. Tricuspid valve regurgitation  mild-moderate.  11. The aortic valve was not well visualized. Aortic valve regurgitation  is not visualized. There is no aortic valve stenosis.  12. The pulmonic valve was not well visualized. Pulmonic valve  regurgitation is not visualized.  13. The interatrial septum was not well visualized.  Risk Assessment/Calculations:    CHA2DS2-VASc Score = 3   This indicates a 3.2% annual risk of stroke. The patient's score is based upon: CHF History: 0 HTN History: 1 Diabetes History: 1 Stroke History: 0 Vascular Disease History: 0 Age Score: 0 Gender Score: 1            Physical Exam:   VS:  BP 120/74 (BP Location: Left Arm, Patient Position: Sitting, Cuff Size: Normal)   Pulse 83   Ht 5\' 7"  (1.702 m)   Wt 255 lb 9.6 oz (115.9 kg)   SpO2 97%   BMI 40.03 kg/m    Wt Readings from Last 3 Encounters:  01/14/23 255 lb 9.6 oz (115.9 kg)  12/08/22 267 lb 3.2 oz (121.2 kg)  11/25/22 270 lb 12.8 oz (122.8 kg)    GEN: Well nourished, well developed in no acute distress NECK: No JVD; No carotid bruits CARDIAC: RRR, no murmurs, rubs, gallops RESPIRATORY:  Clear to auscultation without rales, wheezing or rhonchi  ABDOMEN: Soft, non-tender, non-distended EXTREMITIES:  No edema; No deformity   ASSESSMENT AND PLAN: .   Primary hypertension with blood pressure today 120/74.  Blood pressures have remained stable.  Patient has been continued on lisinopril 40 mg daily, chlorthalidone 25 mg daily and metoprolol titrate 50 mg twice daily.  Encouraged to continue to monitor blood pressures at home as well.  Paroxysmal atrial fibrillation with normal sinus rhythm with a rate of 83 noted on EKG today.  She is continued on Pradaxa 150 mg twice daily for CHA2DS2-VASc of at least 3 for stroke prophylaxis.  She is also continued on metoprolol to tartrate 50 mg twice daily for  rate control.  Mixed hyperlipidemia with an LDL of 50 that remains at goal.  She is continued on atorvastatin 20 mg daily.  Hypothyroidism she is continued on levothyroxine 50 mcg daily.  This continues to be managed by her PCP.  Morbid obesity with a BMI of 40.03.  She is down 12 pounds in the last month.  She has been congratulated on her weight loss and encouraged to continue with increasing her activity and decreasing her caloric intake.  Type 2 diabetes which she is continued on metformin 1000 mg daily.  Previously been on Jardiance but was unable to tolerate side effects.  This continues to be managed by her PCP.       Dispo: Patient to return to clinic to see MD/APP in 1 year or sooner if needed.  Signed, Robben Jagiello, NP

## 2023-01-14 NOTE — Patient Instructions (Signed)
Medication Instructions:  Your Physician recommend you continue on your current medication as directed.    *If you need a refill on your cardiac medications before your next appointment, please call your pharmacy*   Lab Work: None ordered today.  If you have labs (blood work) drawn today and your tests are completely normal, you will receive your results only by: MyChart Message (if you have MyChart) OR A paper copy in the mail If you have any lab test that is abnormal or we need to change your treatment, we will call you to review the results.   Testing/Procedures: None ordered today.   Follow-Up: At [facility], you and your health needs are our priority.  As part of our continuing mission to provide you with exceptional heart care, we have created designated Provider Care Teams.  These Care Teams include your primary Cardiologist (physician) and Advanced Practice Providers (APPs -  Physician Assistants and Nurse Practitioners) who all work together to provide you with the care you need, when you need it.  We recommend signing up for the patient portal called "MyChart".  Sign up information is provided on this After Visit Summary.  MyChart is used to connect with patients for Virtual Visits (Telemedicine).  Patients are able to view lab/test results, encounter notes, upcoming appointments, etc.  Non-urgent messages can be sent to your provider as well.   To learn more about what you can do with MyChart, go to [email or web address].    Your next appointment:   1 year(s)  Provider:   You may see Debbe Odea, MD or one of the following Advanced Practice Providers on your designated Care Team:   Nicolasa Ducking, NP Eula Listen, PA-C Cadence Fransico Michael, PA-C Charlsie Quest, NP

## 2023-02-16 LAB — COLOGUARD

## 2023-02-19 ENCOUNTER — Other Ambulatory Visit: Payer: Self-pay | Admitting: Internal Medicine

## 2023-02-19 DIAGNOSIS — E1169 Type 2 diabetes mellitus with other specified complication: Secondary | ICD-10-CM

## 2023-02-21 ENCOUNTER — Other Ambulatory Visit: Payer: Self-pay

## 2023-02-21 MED ORDER — METOPROLOL TARTRATE 50 MG PO TABS: 50.0000 mg | ORAL_TABLET | Freq: Two times a day (BID) | ORAL | 2 refills | Status: DC

## 2023-02-21 NOTE — Telephone Encounter (Signed)
Requested Prescriptions   Signed Prescriptions Disp Refills   metoprolol tartrate (LOPRESSOR) 50 MG tablet 180 tablet 2    Sig: Take 1 tablet (50 mg total) by mouth 2 (two) times daily.    Authorizing Provider: Debbe Odea    Ordering User: Guerry Minors

## 2023-03-05 DIAGNOSIS — Z1211 Encounter for screening for malignant neoplasm of colon: Secondary | ICD-10-CM | POA: Diagnosis not present

## 2023-03-13 ENCOUNTER — Other Ambulatory Visit: Payer: Self-pay | Admitting: Internal Medicine

## 2023-03-13 DIAGNOSIS — R195 Other fecal abnormalities: Secondary | ICD-10-CM

## 2023-03-13 LAB — COLOGUARD: COLOGUARD: POSITIVE — AB

## 2023-03-15 ENCOUNTER — Telehealth: Payer: Self-pay

## 2023-03-15 ENCOUNTER — Other Ambulatory Visit: Payer: Self-pay

## 2023-03-15 DIAGNOSIS — R195 Other fecal abnormalities: Secondary | ICD-10-CM

## 2023-03-15 DIAGNOSIS — Z1211 Encounter for screening for malignant neoplasm of colon: Secondary | ICD-10-CM

## 2023-03-15 MED ORDER — NA SULFATE-K SULFATE-MG SULF 17.5-3.13-1.6 GM/177ML PO SOLN: 1.0000 | Freq: Once | ORAL | 0 refills | Status: AC

## 2023-03-15 NOTE — Telephone Encounter (Signed)
Gastroenterology Pre-Procedure Review  Request Date: 04/13/23 Requesting Physician: Dr. Allegra Lai  PATIENT REVIEW QUESTIONS: The patient responded to the following health history questions as indicated:    1. Are you having any GI issues? no 2. Do you have a personal history of Polyps? yes (6 or more years ago in [place], positive cologuard) 3. Do you have a family history of Colon Cancer or Polyps? no 4. Diabetes Mellitus? yes (takes Metformin has been advised to stop 2 days prior to colonoscopy does not take Jardiance) 5. Joint replacements in the past 12 months?no 6. Major health problems in the past 3 months?no 7. Any artificial heart valves, MVP, or defibrillator?no 8. Cardiac issues? CHF, PAF, Essential HTN Clearance sent to CV Preop Team    MEDICATIONS & ALLERGIES:    Patient reports the following regarding taking any anticoagulation/antiplatelet therapy:   Plavix, Coumadin, Eliquis, Xarelto, Lovenox, Pradaxa, Brilinta, or Effient? yes (Pradaxa preop advice sent to CV Preop Team) Aspirin? no  Patient confirms/reports the following medications:  Current Outpatient Medications  Medication Sig Dispense Refill   Accu-Chek Softclix Lancets lancets CHECK BLOOD SUGARS DAILY 100 each 12   atorvastatin (LIPITOR) 20 MG tablet TAKE 1 TABLET(20 MG) BY MOUTH DAILY 90 tablet 0   Blood Glucose Monitoring Suppl (ACCU-CHEK GUIDE) w/Device KIT UAD 1 kit 0   chlorthalidone (HYGROTON) 25 MG tablet TAKE 1 TABLET(25 MG) BY MOUTH DAILY 90 tablet 1   dabigatran (PRADAXA) 150 MG CAPS capsule TAKE 1 CAPSULE(150 MG) BY MOUTH TWICE DAILY 180 capsule 3   empagliflozin (JARDIANCE) 10 MG TABS tablet Take 1 tablet (10 mg total) by mouth daily before breakfast. (Patient not taking: Reported on 01/14/2023) 30 tablet 6   fluticasone (FLONASE) 50 MCG/ACT nasal spray Place 1 spray into both nostrils daily. 16 g 2   glucose blood (ACCU-CHEK GUIDE) test strip CHECK BLOOD SUGARS ONCE DAILY 100 strip 6   levothyroxine  (SYNTHROID) 100 MCG tablet TAKE 1/2 TABLET BY MOUTH DAILY BEFORE BREAKFAST AS DIRECTED 45 tablet 2   lisinopril (ZESTRIL) 40 MG tablet Take 1 tablet (40 mg total) by mouth daily. 90 tablet 0   loratadine (CLARITIN) 10 MG tablet Take 1 tablet (10 mg total) by mouth daily as needed for allergies. 30 tablet 2   metFORMIN (GLUCOPHAGE) 1000 MG tablet Take 0.5 tablets (500 mg total) by mouth 2 (two) times daily with a meal. Dose decrease 90 tablet 1   metoprolol tartrate (LOPRESSOR) 50 MG tablet Take 1 tablet (50 mg total) by mouth 2 (two) times daily. 180 tablet 2   PROAIR HFA 108 (90 Base) MCG/ACT inhaler INHALE 1 TO 2 PUFFS INTO THE LUNGS EVERY 6 HOURS AS NEEDED FOR WHEEZING OR SHORTNESS OF BREATH 17 g 1   VITAMIN D, CHOLECALCIFEROL, PO Take by mouth.     No current facility-administered medications for this visit.    Patient confirms/reports the following allergies:  No Known Allergies  No orders of the defined types were placed in this encounter.   AUTHORIZATION INFORMATION Primary Insurance: 1D#: Group #:  Secondary Insurance: 1D#: Group #:  SCHEDULE INFORMATION: Date: 04/13/23 Time: Location: [facility]

## 2023-03-21 ENCOUNTER — Other Ambulatory Visit: Payer: Self-pay | Admitting: Internal Medicine

## 2023-03-21 DIAGNOSIS — Z20822 Contact with and (suspected) exposure to covid-19: Secondary | ICD-10-CM | POA: Diagnosis not present

## 2023-03-21 DIAGNOSIS — I10 Essential (primary) hypertension: Secondary | ICD-10-CM

## 2023-03-21 DIAGNOSIS — R059 Cough, unspecified: Secondary | ICD-10-CM | POA: Diagnosis not present

## 2023-03-21 DIAGNOSIS — R0982 Postnasal drip: Secondary | ICD-10-CM | POA: Diagnosis not present

## 2023-03-21 DIAGNOSIS — J069 Acute upper respiratory infection, unspecified: Secondary | ICD-10-CM | POA: Diagnosis not present

## 2023-03-25 ENCOUNTER — Telehealth: Payer: Self-pay

## 2023-03-25 NOTE — Telephone Encounter (Signed)
   Pre-operative Risk Assessment    Patient Name: Jennifer Miller  DOB: 18-Jun-1965 MRN: [id number]     Request for Surgical Clearance    Procedure:  Colonoscopy  Date of Surgery:  Clearance 04/13/23                                 Surgeon:  Dr. Allegra Lai  Surgeon's Group or Practice Name:  [facility] Phone number:  [phone] Fax number:  [phone]   Type of Clearance Requested:   - Pharmacy:  Hold Dabigatran (Pradaxa)     Type of Anesthesia:  General    Additional requests/questions:    [name]   03/25/2023, 4:23 PM

## 2023-03-28 ENCOUNTER — Encounter: Payer: Self-pay | Admitting: Internal Medicine

## 2023-03-28 ENCOUNTER — Ambulatory Visit: Payer: Medicaid Other | Attending: Internal Medicine | Admitting: Internal Medicine

## 2023-03-28 DIAGNOSIS — Z6839 Body mass index (BMI) 39.0-39.9, adult: Secondary | ICD-10-CM | POA: Diagnosis not present

## 2023-03-28 DIAGNOSIS — E1169 Type 2 diabetes mellitus with other specified complication: Secondary | ICD-10-CM | POA: Diagnosis not present

## 2023-03-28 DIAGNOSIS — Z1231 Encounter for screening mammogram for malignant neoplasm of breast: Secondary | ICD-10-CM

## 2023-03-28 DIAGNOSIS — R0981 Nasal congestion: Secondary | ICD-10-CM | POA: Diagnosis not present

## 2023-03-28 DIAGNOSIS — E66812 Obesity, class 2: Secondary | ICD-10-CM

## 2023-03-28 DIAGNOSIS — B079 Viral wart, unspecified: Secondary | ICD-10-CM | POA: Diagnosis not present

## 2023-03-28 DIAGNOSIS — E785 Hyperlipidemia, unspecified: Secondary | ICD-10-CM

## 2023-03-28 DIAGNOSIS — E039 Hypothyroidism, unspecified: Secondary | ICD-10-CM | POA: Diagnosis not present

## 2023-03-28 DIAGNOSIS — R195 Other fecal abnormalities: Secondary | ICD-10-CM

## 2023-03-28 DIAGNOSIS — N950 Postmenopausal bleeding: Secondary | ICD-10-CM

## 2023-03-28 DIAGNOSIS — E1159 Type 2 diabetes mellitus with other circulatory complications: Secondary | ICD-10-CM | POA: Diagnosis not present

## 2023-03-28 DIAGNOSIS — E119 Type 2 diabetes mellitus without complications: Secondary | ICD-10-CM

## 2023-03-28 DIAGNOSIS — Z7984 Long term (current) use of oral hypoglycemic drugs: Secondary | ICD-10-CM

## 2023-03-28 DIAGNOSIS — I509 Heart failure, unspecified: Secondary | ICD-10-CM

## 2023-03-28 LAB — POCT GLYCOSYLATED HEMOGLOBIN (HGB A1C): HbA1c, POC (controlled diabetic range): 106 % — AB (ref 0.0–7.0)

## 2023-03-28 LAB — GLUCOSE, POCT (MANUAL RESULT ENTRY): POC Glucose: 106 mg/dL — AB (ref 70–99)

## 2023-03-28 MED ORDER — ATORVASTATIN CALCIUM 20 MG PO TABS: 20.0000 mg | ORAL_TABLET | Freq: Every day | ORAL | 1 refills | Status: DC

## 2023-03-28 MED ORDER — FLUTICASONE PROPIONATE 50 MCG/ACT NA SUSP: 1.0000 | Freq: Every day | NASAL | 2 refills | Status: AC

## 2023-03-28 MED ORDER — PROAIR HFA 108 (90 BASE) MCG/ACT IN AERS: 2.0000 | INHALATION_SPRAY | Freq: Four times a day (QID) | RESPIRATORY_TRACT | 1 refills | Status: AC | PRN

## 2023-03-28 NOTE — Telephone Encounter (Signed)
   Patient Name: Jennifer Miller  DOB: 1965-06-27 MRN: [id number]  Primary Cardiologist: Debbe Odea, MD  Chart reviewed as part of pre-operative protocol coverage. Pre-op clearance already addressed by colleagues in earlier phone notes. To summarize recommendations:  -Per office protocol, patient can hold Pradaxa for 2 days prior to procedure.  Please resume when medically safe to do so.  No medical clearance was requested.   Will route this bundled recommendation to requesting provider via Epic fax function and remove from pre-op pool. Please call with questions.  Sharlene Dory, PA-C 03/28/2023, 2:53 PM

## 2023-03-28 NOTE — Progress Notes (Signed)
Patient ID: Jennifer Miller, female    DOB: February 21, 1966  MRN: [id number]  CC: Diabetes (DM f/u. Med refill. Herold Harms to albuterol rx /No to flu vax. )   Subjective: Jennifer Miller is a 57 y.o. female who presents for chronic ds management. Son is with her. Her concerns today include:  Pt with hx of CHF, a.fib, HL, HTN, hypothyroid, IDA with hx of fibroid tumors, anxiety and DM.   DM/Obesity: Results for orders placed or performed in visit on 03/28/23  POCT glycosylated hemoglobin (Hb A1C)  Result Value Ref Range   Hemoglobin A1C     HbA1c POC (<> result, manual entry)     HbA1c, POC (prediabetic range)     HbA1c, POC (controlled diabetic range) 106.0 (A) 0.0 - 7.0 %  POCT glucose (manual entry)  Result Value Ref Range   POC Glucose 6.9 (A) 70 - 99 mg/dl  Q6V on last visit was 8.  She had reported over eating and level of rice.  Dietary counseling was given including the need to cut back on white carbs.  Eating less and walking 1/2 hr 3-4x/wk.  Down 15 lbs since last visit. Stopped walking last wk because she was sick with bronchitis.  Seen at [facility] and feeling better.  Wants RF on Albuterol. Metformin changed to 500 mg twice a day on last visit and Jardiance 10 mg was added.  She stopped the Jardiance after 2 doses because she felt it caused her BS to increase more.  Taking the Metformin 500 mg BID.   Hypothyroidism: Reports compliance with levothyroxine 100 mcg half a tablet daily.  Most recent TSH in August was 1.9.  HTN/A-fib/CHF: Reports compliance with Pradaxa, lisinopril 40 mg daily, chlorthalidone 25 mg daily and metoprolol 50 mg twice a day.  Checks BP but not often.  Limits salt in foods.  Noted some bruising in palms after using a broom.  Clear but left with callous on palm of LT hand.  Request refill on Flonase inhaler for sinus congestion.  On last visit she reported some postmenopausal bleeding episodes. Pelvic ultrasound was ordered and she was referred to GYN.  She did  not get the pelvic ultrasound done; reports she does not remember being called.  EMB was negative but according to the gynecologist, she did not get all the way up into the uterus.  She wanted to see how the ultrasound looked and then discussed repeat sampling. No further bleeding.  Pos Cologuard test earlier this mth.  Referred to GI.  C-scope scheduled 04/13/23  HM: Due for flu shot.  Referred for mammogram earlier this year which she did not have done. Reports she had another appt or something at the time Patient Active Problem List   Diagnosis Date Noted   Postmenopausal bleeding 12/08/2022   Diabetes mellitus with background retinopathy (HCC) 09/02/2021   De Quervain's tenosynovitis, left 04/02/2021   Pain due to onychomycosis of toenails of both feet 10/18/2019   Type 2 diabetes mellitus with obesity (HCC) 07/24/2019   Generalized anxiety disorder 08/28/2018   Controlled type 2 diabetes mellitus without complication, without long-term current use of insulin (HCC) 12/30/2017   Essential hypertension 12/30/2017   Iron deficiency anemia 12/30/2017   PAF (paroxysmal atrial fibrillation) (HCC) 12/30/2017   Acquired hypothyroidism 12/30/2017   History of colon polyps 12/30/2017   Hyperlipidemia 12/30/2017   Congestive heart failure (HCC) 12/30/2017     Current Outpatient Medications on File Prior to Visit  Medication Sig Dispense Refill  Accu-Chek Softclix Lancets lancets CHECK BLOOD SUGARS DAILY 100 each 12   Blood Glucose Monitoring Suppl (ACCU-CHEK GUIDE) w/Device KIT UAD 1 kit 0   chlorthalidone (HYGROTON) 25 MG tablet TAKE 1 TABLET(25 MG) BY MOUTH DAILY 90 tablet 1   dabigatran (PRADAXA) 150 MG CAPS capsule TAKE 1 CAPSULE(150 MG) BY MOUTH TWICE DAILY 180 capsule 3   glucose blood (ACCU-CHEK GUIDE) test strip CHECK BLOOD SUGARS ONCE DAILY 100 strip 6   levothyroxine (SYNTHROID) 100 MCG tablet TAKE 1/2 TABLET BY MOUTH DAILY BEFORE BREAKFAST AS DIRECTED 45 tablet 2   lisinopril  (ZESTRIL) 40 MG tablet Take 1 tablet (40 mg total) by mouth daily. 90 tablet 0   loratadine (CLARITIN) 10 MG tablet Take 1 tablet (10 mg total) by mouth daily as needed for allergies. 30 tablet 2   metFORMIN (GLUCOPHAGE) 1000 MG tablet Take 0.5 tablets (500 mg total) by mouth 2 (two) times daily with a meal. Dose decrease 90 tablet 1   metoprolol tartrate (LOPRESSOR) 50 MG tablet Take 1 tablet (50 mg total) by mouth 2 (two) times daily. 180 tablet 2   VITAMIN D, CHOLECALCIFEROL, PO Take by mouth.     No current facility-administered medications on file prior to visit.    No Known Allergies  Social History   Socioeconomic History   Marital status: Single    Spouse name: Not on file   Number of children: 3   Years of education: Not on file   Highest education level: Not on file  Occupational History   Occupation: unemployed  Tobacco Use   Smoking status: Former   Smokeless tobacco: Never  Vaping Use   Vaping status: Never Used  Substance and Sexual Activity   Alcohol use: Yes    Comment: rarely   Drug use: Never   Sexual activity: Yes    Birth control/protection: None  Other Topics Concern   Not on file  Social History Narrative   Not on file   Social Determinants of Health   Financial Resource Strain: Low Risk  (03/28/2023)   Overall Financial Resource Strain (CARDIA)    Difficulty of Paying Living Expenses: Not very hard  Food Insecurity: No Food Insecurity (03/28/2023)   Hunger Vital Sign    Worried About Running Out of Food in the Last Year: Never true    Ran Out of Food in the Last Year: Never true  Transportation Needs: No Transportation Needs (03/28/2023)   PRAPARE - Administrator, Civil Service (Medical): No    Lack of Transportation (Non-Medical): No  Physical Activity: Sufficiently Active (03/28/2023)   Exercise Vital Sign    Days of Exercise per Week: 4 days    Minutes of Exercise per Session: 40 min  Stress: No Stress Concern Present  (03/28/2023)   [employer] of Occupational Health - Occupational Stress Questionnaire    Feeling of Stress : Not at all  Social Connections: Socially Isolated (03/28/2023)   Social Connection and Isolation Panel [NHANES]    Frequency of Communication with Friends and Family: Three times a week    Frequency of Social Gatherings with Friends and Family: Three times a week    Attends Religious Services: Never    Active Member of Clubs or Organizations: No    Attends Banker Meetings: Never    Marital Status: Divorced  Catering manager Violence: Not At Risk (03/28/2023)   Humiliation, Afraid, Rape, and Kick questionnaire    Fear of Current or Ex-Partner: No  Emotionally Abused: No    Physically Abused: No    Sexually Abused: No    Family History  Problem Relation Age of Onset   Diabetes Mother    Hypertension Mother    Dementia Mother    Renal cancer Mother    Diabetes Father    Hypertension Father    Diabetes Maternal Grandmother    Diabetes Paternal Grandmother    Heart disease Paternal Grandmother     Past Surgical History:  Procedure Laterality Date   DILATION AND CURETTAGE OF UTERUS     KNEE ARTHROSCOPY AND ARTHROTOMY      ROS: Review of Systems Negative except as stated above  PHYSICAL EXAM: BP 118/83   Pulse 72   Temp 98.3 F (36.8 C) (Oral)   Ht 5\' 7"  (1.702 m)   Wt 255 lb (115.7 kg)   SpO2 97%   BMI 39.94 kg/m   Wt Readings from Last 3 Encounters:  03/28/23 255 lb (115.7 kg)  01/14/23 255 lb 9.6 oz (115.9 kg)  12/08/22 267 lb 3.2 oz (121.2 kg)    Physical Exam  General appearance - alert, well appearing, and in no distress Mental status - normal mood, behavior, speech, dress, motor activity, and thought processes Neck - supple, no significant adenopathy Chest - clear to auscultation, no wheezes, rales or rhonchi, symmetric air entry Heart - normal rate, regular rhythm, normal S1, S2, no murmurs, rubs, clicks or  gallops Extremities - peripheral pulses normal, no pedal edema, no clubbing or cyanosis Skin:  callous vs wart palm of LT hand located just above the grove where the  median nerve is located     Latest Ref Rng & Units 12/09/2022    2:48 PM 07/26/2022    4:51 PM 03/09/2021   11:19 AM  CMP  Glucose 70 - 99 mg/dL 478  295  621   BUN 6 - 24 mg/dL 16  12  12    Creatinine 0.57 - 1.00 mg/dL 3.08  6.57  8.46   Sodium 134 - 144 mmol/L 138  142  137   Potassium 3.5 - 5.2 mmol/L 4.0  4.5  4.7   Chloride 96 - 106 mmol/L 98  99  99   CO2 20 - 29 mmol/L 27  27  27    Calcium 8.7 - 10.2 mg/dL 96.2  95.2  84.1   Total Protein 6.0 - 8.5 g/dL  7.3    Total Bilirubin 0.0 - 1.2 mg/dL  0.4    Alkaline Phos 44 - 121 IU/L  61    AST 0 - 40 IU/L  35    ALT 0 - 32 IU/L  55     Lipid Panel     Component Value Date/Time   CHOL 114 07/26/2022 1651   TRIG 124 07/26/2022 1651   HDL 42 07/26/2022 1651   CHOLHDL 2.7 07/26/2022 1651   LDLCALC 50 07/26/2022 1651    CBC    Component Value Date/Time   WBC 6.8 07/26/2022 1651   WBC 3.6 (L) 09/29/2019 0859   RBC 4.87 07/26/2022 1651   RBC 5.36 (H) 09/29/2019 0859   HGB 12.8 07/26/2022 1651   HCT 39.4 07/26/2022 1651   PLT 320 07/26/2022 1651   MCV 81 07/26/2022 1651   MCH 26.3 (L) 07/26/2022 1651   MCH 25.7 (L) 09/29/2019 0859   MCHC 32.5 07/26/2022 1651   MCHC 32.6 09/29/2019 0859   RDW 14.9 07/26/2022 1651   LYMPHSABS 1.4 09/29/2019 0859  MONOABS 0.5 09/29/2019 0859   EOSABS 0.0 09/29/2019 0859   BASOSABS 0.0 09/29/2019 0859    ASSESSMENT AND PLAN: 1. Type 2 diabetes mellitus with morbid obesity (HCC) Morbid obesity in setting of HTN and HL At goal.  Commended her on weight loss.  Encouraged her to continue healthy eating habits and regular exercise.  Continue metformin.  Jardiance discontinued since she has not been taking it. - POCT glycosylated hemoglobin (Hb A1C) - POCT glucose (manual entry)  2. Diabetes mellitus treated with oral  medication (HCC) See #1 above  3. Chronic congestive heart failure, unspecified heart failure type (HCC) Stable and compensated.  Continue metoprolol  4. Hypertension associated with type 2 diabetes mellitus (HCC) Close to goal.  Continue metoprolol 50 mg twice a day, lisinopril 40 mg daily and chlorthalidone.  5. Acquired hypothyroidism Continue levothyroxine 100 mcg half a tablet daily.  6. Positive colorectal cancer screening using Cologuard test Keep upcoming appointment for colonoscopy.  7. Hyperlipidemia associated with type 2 diabetes mellitus (HCC) - atorvastatin (LIPITOR) 20 MG tablet; Take 1 tablet (20 mg total) by mouth daily.  Dispense: 90 tablet; Refill: 1  8. Sinus congestion - fluticasone (FLONASE) 50 MCG/ACT nasal spray; Place 1 spray into both nostrils daily.  Dispense: 16 g; Refill: 2  9. Postmenopausal bleeding Patient is agreeable for me to reorder the pelvic ultrasound.  Once I get the results, I will forward it to her gynecologist.  Encouraged her to follow-up with the gynecologist after having pelvic ultrasound. - US Pelvic Complete With Transvaginal; Future  10. Wart of hand - Ambulatory referral to Dermatology  11. Encounter for screening mammogram for malignant neoplasm of breast - MM Digital Screening; Future   Patient was given the opportunity to ask questions.  Patient verbalized understanding of the plan and was able to repeat key elements of the plan.   This documentation was completed using Paediatric nurse.  Any transcriptional errors are unintentional.  Orders Placed This Encounter  Procedures   US Pelvic Complete With Transvaginal   MM Digital Screening   Ambulatory referral to Dermatology   POCT glycosylated hemoglobin (Hb A1C)   POCT glucose (manual entry)     Requested Prescriptions   Signed Prescriptions Disp Refills   fluticasone (FLONASE) 50 MCG/ACT nasal spray 16 g 2    Sig: Place 1 spray into both nostrils  daily.   atorvastatin (LIPITOR) 20 MG tablet 90 tablet 1    Sig: Take 1 tablet (20 mg total) by mouth daily.   PROAIR HFA 108 (90 Base) MCG/ACT inhaler 17 g 1    Sig: Inhale 2 puffs into the lungs every 6 (six) hours as needed for wheezing or shortness of breath.    Return in about 4 months (around 07/26/2023).  Jonah Blue, MD, FACP

## 2023-03-28 NOTE — Telephone Encounter (Signed)
Patient with diagnosis of atrial fibrilation On Pradaxa for anticoagulation.    Procedure: Colonoscopy  Date of procedure: 04/13/2023   CHA2DS2-VASc Score = 3   This indicates a 3.2% annual risk of stroke. The patient's score is based upon: CHF History: 0 HTN History: 1 Diabetes History: 1 Stroke History: 0 Vascular Disease History: 0 Age Score: 0 Gender Score: 1   CrCl 93 mL/min  Platelet count 320 K (07/26/2022)   Per office protocol, patient can hold Pradaxa for 2 days prior to procedure.     **This guidance is not considered finalized until pre-operative APP has relayed final recommendations.**

## 2023-03-30 ENCOUNTER — Telehealth: Payer: Self-pay

## 2023-03-30 NOTE — Telephone Encounter (Signed)
Left voice message advising patient to stop Pradaxa 2 days prior to colonoscopy. Mychart message sent also.  Thanks,  [place], [place]

## 2023-04-05 ENCOUNTER — Inpatient Hospital Stay: Admission: RE | Admit: 2023-04-05 | Payer: Medicaid Other | Source: Ambulatory Visit

## 2023-04-06 ENCOUNTER — Ambulatory Visit
Admission: RE | Admit: 2023-04-06 | Discharge: 2023-04-06 | Disposition: A | Discharge status: Home / Self Care | Payer: Medicaid Other | Source: Ambulatory Visit | Attending: Internal Medicine | Admitting: Internal Medicine

## 2023-04-06 DIAGNOSIS — N950 Postmenopausal bleeding: Secondary | ICD-10-CM | POA: Diagnosis not present

## 2023-04-06 DIAGNOSIS — N852 Hypertrophy of uterus: Secondary | ICD-10-CM | POA: Diagnosis not present

## 2023-04-06 DIAGNOSIS — D259 Leiomyoma of uterus, unspecified: Secondary | ICD-10-CM | POA: Diagnosis not present

## 2023-04-08 ENCOUNTER — Telehealth: Payer: Self-pay | Admitting: Internal Medicine

## 2023-04-08 NOTE — Telephone Encounter (Signed)
Let pt know the pelvic ultrasound showed fibroids in your uterus.  These can cause abnormal bleeding in women who are not menopausal.  I have sent a message to your gynecologist Dr. Jonette Eva about the results.  She said pt should follow up with her to discuss further management and repeat sampling of the uterus. She should call to schedule.

## 2023-04-11 NOTE — Telephone Encounter (Addendum)
Left message on voicemail to return call.

## 2023-04-11 NOTE — Telephone Encounter (Signed)
Pt name and DOB verified. Patient aware of results and result note per Dr.Johnson.   Patient states she will f/u with GYN , Dr. Jonette Eva. The biopsy was painful. Does not think she will do this again.

## 2023-04-13 ENCOUNTER — Ambulatory Visit
Admission: RE | Admit: 2023-04-13 | Discharge: 2023-04-13 | Disposition: A | Discharge status: Home / Self Care | Payer: Medicaid Other | Attending: Gastroenterology | Admitting: Gastroenterology

## 2023-04-13 ENCOUNTER — Encounter
Admission: RE | Disposition: A | Discharge status: Home / Self Care | Payer: Self-pay | Source: Home / Self Care | Attending: Gastroenterology

## 2023-04-13 ENCOUNTER — Ambulatory Visit: Payer: Medicaid Other

## 2023-04-13 DIAGNOSIS — Z87891 Personal history of nicotine dependence: Secondary | ICD-10-CM | POA: Diagnosis not present

## 2023-04-13 DIAGNOSIS — I110 Hypertensive heart disease with heart failure: Secondary | ICD-10-CM | POA: Insufficient documentation

## 2023-04-13 DIAGNOSIS — R195 Other fecal abnormalities: Secondary | ICD-10-CM

## 2023-04-13 DIAGNOSIS — Z7984 Long term (current) use of oral hypoglycemic drugs: Secondary | ICD-10-CM | POA: Diagnosis not present

## 2023-04-13 DIAGNOSIS — Z1211 Encounter for screening for malignant neoplasm of colon: Secondary | ICD-10-CM | POA: Insufficient documentation

## 2023-04-13 DIAGNOSIS — K5730 Diverticulosis of large intestine without perforation or abscess without bleeding: Secondary | ICD-10-CM | POA: Insufficient documentation

## 2023-04-13 DIAGNOSIS — E119 Type 2 diabetes mellitus without complications: Secondary | ICD-10-CM | POA: Diagnosis not present

## 2023-04-13 DIAGNOSIS — I480 Paroxysmal atrial fibrillation: Secondary | ICD-10-CM | POA: Diagnosis not present

## 2023-04-13 DIAGNOSIS — D124 Benign neoplasm of descending colon: Secondary | ICD-10-CM | POA: Insufficient documentation

## 2023-04-13 DIAGNOSIS — K635 Polyp of colon: Secondary | ICD-10-CM | POA: Insufficient documentation

## 2023-04-13 DIAGNOSIS — I509 Heart failure, unspecified: Secondary | ICD-10-CM | POA: Insufficient documentation

## 2023-04-13 HISTORY — PX: BIOPSY: SHX5522

## 2023-04-13 HISTORY — PX: COLONOSCOPY WITH PROPOFOL: SHX5780

## 2023-04-13 LAB — GLUCOSE, CAPILLARY: Glucose-Capillary: 133 mg/dL — ABNORMAL HIGH (ref 70–99)

## 2023-04-13 SURGERY — COLONOSCOPY WITH PROPOFOL
Anesthesia: General

## 2023-04-13 MED ORDER — PHENYLEPHRINE 80 MCG/ML (10ML) SYRINGE FOR IV PUSH (FOR BLOOD PRESSURE SUPPORT)
PREFILLED_SYRINGE | INTRAVENOUS | Status: DC | PRN
  Administered 2023-04-13: 80 ug via INTRAVENOUS

## 2023-04-13 MED ORDER — PROPOFOL 10 MG/ML IV BOLUS
INTRAVENOUS | Status: DC | PRN
  Administered 2023-04-13: 20 mg via INTRAVENOUS
  Administered 2023-04-13: 80 mg via INTRAVENOUS

## 2023-04-13 MED ORDER — SODIUM CHLORIDE 0.9 % IV SOLN: INTRAVENOUS | Status: DC

## 2023-04-13 MED ORDER — PROPOFOL 10 MG/ML IV BOLUS
INTRAVENOUS | Status: AC
  Filled 2023-04-13: qty 40

## 2023-04-13 MED ORDER — PROPOFOL 500 MG/50ML IV EMUL
INTRAVENOUS | Status: DC | PRN
  Administered 2023-04-13: 100 ug/kg/min via INTRAVENOUS

## 2023-04-13 MED ORDER — LIDOCAINE HCL (CARDIAC) PF 100 MG/5ML IV SOSY
PREFILLED_SYRINGE | INTRAVENOUS | Status: DC | PRN
  Administered 2023-04-13: 40 mg via INTRAVENOUS

## 2023-04-13 NOTE — Anesthesia Postprocedure Evaluation (Signed)
Anesthesia Post Note  Patient: Jennifer Miller  Procedure(s) Performed: COLONOSCOPY WITH PROPOFOL BIOPSY  Patient location during evaluation: PACU Anesthesia Type: General Level of consciousness: awake and alert Pain management: pain level controlled Vital Signs Assessment: post-procedure vital signs reviewed and stable Respiratory status: spontaneous breathing, nonlabored ventilation, respiratory function stable and patient connected to nasal cannula oxygen Cardiovascular status: blood pressure returned to baseline and stable Postop Assessment: no apparent nausea or vomiting Anesthetic complications: no   No notable events documented.   Last Vitals:  Vitals:   04/13/23 0917 04/13/23 0926  BP: 92/61 95/69  Pulse:    Resp:    Temp:    SpO2:      Last Pain:  Vitals:   04/13/23 0926  TempSrc:   PainSc: 0-No pain                 Yevette Edwards

## 2023-04-13 NOTE — Transfer of Care (Signed)
Immediate Anesthesia Transfer of Care Note  Patient: Jennifer Miller  Procedure(s) Performed: COLONOSCOPY WITH PROPOFOL BIOPSY  Patient Location: PACU  Anesthesia Type:MAC  Level of Consciousness: drowsy  Airway & Oxygen Therapy: Patient Spontanous Breathing  Post-op Assessment: Report given to RN and Post -op Vital signs reviewed and stable  Post vital signs: Reviewed and stable  Last Vitals:  Vitals Value Taken Time  BP 105/58 04/13/23 0900  Temp    Pulse 74 04/13/23 0900  Resp 22 04/13/23 0900  SpO2 98 % 04/13/23 0900  Vitals shown include unfiled device data.  Last Pain:  Vitals:   04/13/23 0857  TempSrc:   PainSc: 0-No pain         Complications: No notable events documented.

## 2023-04-13 NOTE — Anesthesia Preprocedure Evaluation (Signed)
Anesthesia Evaluation  Patient identified by MRN, date of birth, ID band Patient awake    Reviewed: Allergy & Precautions, H&P , NPO status , Patient's Chart, lab work & pertinent test results, reviewed documented beta blocker date and time   Airway Mallampati: II   Neck ROM: full    Dental  (+) Poor Dentition   Pulmonary neg pulmonary ROS, former smoker   Pulmonary exam normal        Cardiovascular Exercise Tolerance: Good hypertension, +CHF  (-) Orthopnea, (-) PND and (-) DOE Normal cardiovascular exam Rhythm:regular Rate:Normal     Neuro/Psych   Anxiety     negative neurological ROS  negative psych ROS   GI/Hepatic negative GI ROS, Neg liver ROS,,,  Endo/Other  diabetes, Well ControlledHypothyroidism    Renal/GU negative Renal ROS  negative genitourinary   Musculoskeletal   Abdominal   Peds  Hematology  (+) Blood dyscrasia, anemia   Anesthesia Other Findings Past Medical History: No date: Congestive heart failure (CHF) (HCC) No date: Diabetes mellitus without complication (HCC) No date: HLD (hyperlipidemia) No date: Hypertension No date: Paroxysmal A-fib (HCC) Past Surgical History: No date: DILATION AND CURETTAGE OF UTERUS No date: KNEE ARTHROSCOPY AND ARTHROTOMY BMI    Body Mass Index: 39.54 kg/m     Reproductive/Obstetrics negative OB ROS                             Anesthesia Physical Anesthesia Plan  ASA: 3  Anesthesia Plan: General   Post-op Pain Management:    Induction:   PONV Risk Score and Plan:   Airway Management Planned:   Additional Equipment:   Intra-op Plan:   Post-operative Plan:   Informed Consent: I have reviewed the patients History and Physical, chart, labs and discussed the procedure including the risks, benefits and alternatives for the proposed anesthesia with the patient or authorized representative who has indicated his/her understanding  and acceptance.     Dental Advisory Given  Plan Discussed with: CRNA  Anesthesia Plan Comments:        Anesthesia Quick Evaluation

## 2023-04-13 NOTE — Op Note (Signed)
[facility] Gastroenterology Patient Name: Jennifer Miller Procedure Date: 04/13/2023 8:29 AM MRN: [id number] Account #: [id number] Date of Birth: 1965-12-31 Admit Type: Outpatient Age: 57 Room: [facility] ENDO ROOM 1 Gender: Female Note Status: Finalized Instrument Name: Colonoscope [id number] Procedure:             Colonoscopy Indications:           Positive Cologuard test Providers:             Toney Reil MD, MD Referring MD:          Toney Reil MD, MD (Referring MD), Marcine Matar (Referring MD) Medicines:             General Anesthesia Complications:         No immediate complications. Estimated blood loss: None. Procedure:             Pre-Anesthesia Assessment:                        - Prior to the procedure, a History and Physical was                         performed, and patient medications and allergies were                         reviewed. The patient is competent. The risks and                         benefits of the procedure and the sedation options and                         risks were discussed with the patient. All questions                         were answered and informed consent was obtained.                         Patient identification and proposed procedure were                         verified by the physician, the nurse, the                         anesthesiologist, the anesthetist and the technician                         in the pre-procedure area in the procedure room in the                         endoscopy suite. Mental Status Examination: alert and                         oriented. Airway Examination: normal oropharyngeal                         airway and neck mobility. Respiratory Examination:  clear to auscultation. CV Examination: normal.                         Prophylactic Antibiotics: The patient does not require                         prophylactic antibiotics.  Prior Anticoagulants: The                         patient has taken Pradaxa (dabigatran), last dose was                         3 days prior to procedure. ASA Grade Assessment: III -                         A patient with severe systemic disease. After                         reviewing the risks and benefits, the patient was                         deemed in satisfactory condition to undergo the                         procedure. The anesthesia plan was to use general                         anesthesia. Immediately prior to administration of                         medications, the patient was re-assessed for adequacy                         to receive sedatives. The heart rate, respiratory                         rate, oxygen saturations, blood pressure, adequacy of                         pulmonary ventilation, and response to care were                         monitored throughout the procedure. The physical                         status of the patient was re-assessed after the                         procedure.                        After obtaining informed consent, the colonoscope was                         passed under direct vision. Throughout the procedure,                         the patient's blood pressure, pulse, and oxygen  saturations were monitored continuously. The                         Colonoscope was introduced through the anus and                         advanced to the the cecum, identified by appendiceal                         orifice and ileocecal valve. The colonoscopy was                         performed without difficulty. The patient tolerated                         the procedure well. The quality of the bowel                         preparation was evaluated using the BBPS [facility] Bowel                         Preparation Scale) with scores of: Right Colon = 3,                         Transverse Colon = 3 and Left Colon = 3 (entire  mucosa                         seen well with no residual staining, small fragments                         of stool or opaque liquid). The total BBPS score                         equals 9. The ileocecal valve, appendiceal orifice,                         and rectum were photographed. Findings:      The perianal and digital rectal examinations were normal. Pertinent       negatives include normal sphincter tone and no palpable rectal lesions.      Two sessile polyps were found in the sigmoid colon and descending colon.       The polyps were diminutive in size. These polyps were removed with a       jumbo cold forceps. Resection and retrieval were complete. Estimated       blood loss: none.      The retroflexed view of the distal rectum and anal verge was normal and       showed no anal or rectal abnormalities.      Multiple medium-mouthed diverticula were found in the recto-sigmoid       colon and sigmoid colon. Impression:            - Two diminutive polyps in the sigmoid colon and in                         the descending colon, removed with a jumbo cold  forceps. Resected and retrieved.                        - The distal rectum and anal verge are normal on                         retroflexion view.                        - Diverticulosis in the recto-sigmoid colon and in the                         sigmoid colon. Recommendation:        - Discharge patient to home (with escort).                        - Resume previous diet today.                        - Continue present medications.                        - Await pathology results.                        - Repeat colonoscopy in 7-10 years for surveillance                         based on pathology results. Procedure Code(s):     --- Professional ---                        [phone], Colonoscopy, flexible; with biopsy, single or                         multiple Diagnosis Code(s):     --- Professional ---                         D12.5, Benign neoplasm of sigmoid colon                        D12.0, Benign neoplasm of cecum                        R19.5, Other fecal abnormalities                        K57.30, Diverticulosis of large intestine without                         perforation or abscess without bleeding CPT copyright 2022 American Medical Association. All rights reserved. The codes documented in this report are preliminary and upon coder review may  be revised to meet current compliance requirements. Dr. Libby Maw Toney Reil MD, MD 04/13/2023 8:54:50 AM This report has been signed electronically. Number of Addenda: 0 Note Initiated On: 04/13/2023 8:29 AM Scope Withdrawal Time: 0 hours 9 minutes 20 seconds  Total Procedure Duration: 0 hours 12 minutes 8 seconds  Estimated Blood Loss:  Estimated blood loss: none.      [facility]

## 2023-04-13 NOTE — H&P (Signed)
Arlyss Repress, MD [address]  [address]  [place], [place] [address]  Main: [phone]  Fax: [phone] Pager: [phone]  Primary Care Physician:  Marcine Matar, MD Primary Gastroenterologist:  Dr. Arlyss Repress  Pre-Procedure History & Physical: HPI:  Jennifer Miller is a 57 y.o. female is here for an colonoscopy.   Past Medical History:  Diagnosis Date   Congestive heart failure (CHF) (HCC)    Diabetes mellitus without complication (HCC)    HLD (hyperlipidemia)    Hypertension    Paroxysmal A-fib (HCC)     Past Surgical History:  Procedure Laterality Date   DILATION AND CURETTAGE OF UTERUS     KNEE ARTHROSCOPY AND ARTHROTOMY      Prior to Admission medications   Medication Sig Start Date End Date Taking? Authorizing Provider  Accu-Chek Softclix Lancets lancets CHECK BLOOD SUGARS DAILY 08/23/22  Yes Marcine Matar, MD  atorvastatin (LIPITOR) 20 MG tablet Take 1 tablet (20 mg total) by mouth daily. 03/28/23  Yes Marcine Matar, MD  Blood Glucose Monitoring Suppl (ACCU-CHEK GUIDE) w/Device KIT UAD 06/30/21  Yes Marcine Matar, MD  chlorthalidone (HYGROTON) 25 MG tablet TAKE 1 TABLET(25 MG) BY MOUTH DAILY 03/21/23  Yes Marcine Matar, MD  glucose blood (ACCU-CHEK GUIDE) test strip CHECK BLOOD SUGARS ONCE DAILY 08/23/22  Yes Marcine Matar, MD  levothyroxine (SYNTHROID) 100 MCG tablet TAKE 1/2 TABLET BY MOUTH DAILY BEFORE BREAKFAST AS DIRECTED 11/25/22  Yes Marcine Matar, MD  lisinopril (ZESTRIL) 40 MG tablet Take 1 tablet (40 mg total) by mouth daily. 08/30/22  Yes Debbe Odea, MD  metFORMIN (GLUCOPHAGE) 1000 MG tablet Take 0.5 tablets (500 mg total) by mouth 2 (two) times daily with a meal. Dose decrease 11/25/22  Yes Marcine Matar, MD  metoprolol tartrate (LOPRESSOR) 50 MG tablet Take 1 tablet (50 mg total) by mouth 2 (two) times daily. 02/21/23  Yes Agbor-Etang, Arlys John, MD  VITAMIN D, CHOLECALCIFEROL, PO Take by mouth.   Yes  [provider]  dabigatran (PRADAXA) 150 MG CAPS capsule TAKE 1 CAPSULE(150 MG) BY MOUTH TWICE DAILY 01/14/23   Hammock, Lavonna Rua, NP  fluticasone (FLONASE) 50 MCG/ACT nasal spray Place 1 spray into both nostrils daily. Patient taking differently: Place into both nostrils daily. 03/28/23   Marcine Matar, MD  loratadine (CLARITIN) 10 MG tablet Take 1 tablet (10 mg total) by mouth daily as needed for allergies. 03/23/19   Marcine Matar, MD  PROAIR HFA 108 [phone] Base) MCG/ACT inhaler Inhale 2 puffs into the lungs every 6 (six) hours as needed for wheezing or shortness of breath. 03/28/23   Marcine Matar, MD    Allergies as of 03/15/2023   (No Known Allergies)    Family History  Problem Relation Age of Onset   Diabetes Mother    Hypertension Mother    Dementia Mother    Renal cancer Mother    Diabetes Father    Hypertension Father    Diabetes Maternal Grandmother    Diabetes Paternal Grandmother    Heart disease Paternal Grandmother     Social History   Socioeconomic History   Marital status: Single    Spouse name: Not on file   Number of children: 3   Years of education: Not on file   Highest education level: Not on file  Occupational History   Occupation: unemployed  Tobacco Use   Smoking status: Former   Smokeless tobacco: Never  Advertising account planner  Vaping status: Never Used  Substance and Sexual Activity   Alcohol use: Yes    Comment: rarely   Drug use: Never   Sexual activity: Yes    Birth control/protection: None  Other Topics Concern   Not on file  Social History Narrative   Not on file   Social Determinants of Health   Financial Resource Strain: Low Risk  (03/28/2023)   Overall Financial Resource Strain (CARDIA)    Difficulty of Paying Living Expenses: Not very hard  Food Insecurity: No Food Insecurity (03/28/2023)   Hunger Vital Sign    Worried About Running Out of Food in the Last Year: Never true    Ran Out of Food in the Last Year: Never  true  Transportation Needs: No Transportation Needs (03/28/2023)   PRAPARE - Administrator, Civil Service (Medical): No    Lack of Transportation (Non-Medical): No  Physical Activity: Sufficiently Active (03/28/2023)   Exercise Vital Sign    Days of Exercise per Week: 4 days    Minutes of Exercise per Session: 40 min  Stress: No Stress Concern Present (03/28/2023)   [employer] of Occupational Health - Occupational Stress Questionnaire    Feeling of Stress : Not at all  Social Connections: Socially Isolated (03/28/2023)   Social Connection and Isolation Panel [NHANES]    Frequency of Communication with Friends and Family: Three times a week    Frequency of Social Gatherings with Friends and Family: Three times a week    Attends Religious Services: Never    Active Member of Clubs or Organizations: No    Attends Banker Meetings: Never    Marital Status: Divorced  Catering manager Violence: Not At Risk (03/28/2023)   Humiliation, Afraid, Rape, and Kick questionnaire    Fear of Current or Ex-Partner: No    Emotionally Abused: No    Physically Abused: No    Sexually Abused: No    Review of Systems: See HPI, otherwise negative ROS  Physical Exam: BP 121/81   Pulse 79   Temp (!) 96.9 F (36.1 C) (Temporal)   Resp 18   Ht 5\' 7"  (1.702 m)   Wt 114.5 kg   LMP 12/01/2017   SpO2 100%   BMI 39.54 kg/m  General:   Alert,  pleasant and cooperative in NAD Head:  Normocephalic and atraumatic. Neck:  Supple; no masses or thyromegaly. Lungs:  Clear throughout to auscultation.    Heart:  Regular rate and rhythm. Abdomen:  Soft, nontender and nondistended. Normal bowel sounds, without guarding, and without rebound.   Neurologic:  Alert and  oriented x4;  grossly normal neurologically.  Impression/Plan: Jennifer Miller is here for an colonoscopy to be performed for cologuard positive  Risks, benefits, limitations, and alternatives regarding  colonoscopy  have been reviewed with the patient.  Questions have been answered.  All parties agreeable.   Lannette Donath, MD  04/13/2023, 8:31 AM

## 2023-04-13 NOTE — Anesthesia Postprocedure Evaluation (Deleted)
Anesthesia Post Note  Patient: Jennifer Miller  Procedure(s) Performed: COLONOSCOPY WITH PROPOFOL BIOPSY  Patient location during evaluation: PACU Anesthesia Type: General Level of consciousness: awake and alert Pain management: pain level controlled Vital Signs Assessment: post-procedure vital signs reviewed and stable Respiratory status: spontaneous breathing, nonlabored ventilation, respiratory function stable and patient connected to nasal cannula oxygen Cardiovascular status: blood pressure returned to baseline and stable Postop Assessment: no apparent nausea or vomiting Anesthetic complications: no   No notable events documented.   Last Vitals:  Vitals:   04/13/23 0800  BP: 121/81  Pulse: 79  Resp: 18  Temp: (!) 36.1 C  SpO2: 100%    Last Pain:  Vitals:   04/13/23 0800  TempSrc: Temporal                 Yevette Edwards

## 2023-04-14 ENCOUNTER — Encounter: Payer: Self-pay | Admitting: Gastroenterology

## 2023-04-14 LAB — SURGICAL PATHOLOGY

## 2023-04-29 ENCOUNTER — Ambulatory Visit: Payer: Medicaid Other

## 2023-05-18 ENCOUNTER — Ambulatory Visit: Payer: Medicaid Other

## 2023-05-20 ENCOUNTER — Telehealth: Payer: Self-pay

## 2023-05-20 NOTE — Telephone Encounter (Signed)
Called pt per Dr. Reed Breech to get scheduled for an appt. Left message for pt to call office back.

## 2023-05-31 ENCOUNTER — Other Ambulatory Visit: Payer: Self-pay | Admitting: Internal Medicine

## 2023-05-31 DIAGNOSIS — E1169 Type 2 diabetes mellitus with other specified complication: Secondary | ICD-10-CM

## 2023-06-01 ENCOUNTER — Ambulatory Visit
Admission: RE | Admit: 2023-06-01 | Discharge: 2023-06-01 | Disposition: A | Discharge status: Home / Self Care | Payer: Medicaid Other | Source: Ambulatory Visit | Attending: Internal Medicine | Admitting: Internal Medicine

## 2023-06-01 ENCOUNTER — Other Ambulatory Visit: Payer: Self-pay

## 2023-06-01 DIAGNOSIS — Z1231 Encounter for screening mammogram for malignant neoplasm of breast: Secondary | ICD-10-CM

## 2023-06-01 MED ORDER — LISINOPRIL 40 MG PO TABS: 40.0000 mg | ORAL_TABLET | Freq: Every day | ORAL | 3 refills | Status: DC

## 2023-06-02 ENCOUNTER — Encounter: Payer: Self-pay | Admitting: Internal Medicine

## 2023-06-14 ENCOUNTER — Telehealth: Payer: Self-pay

## 2023-06-14 NOTE — Telephone Encounter (Signed)
Left message for pt to call office back regarding follow up appt w/ Dr. Shawnie Pons

## 2023-07-25 DIAGNOSIS — E113293 Type 2 diabetes mellitus with mild nonproliferative diabetic retinopathy without macular edema, bilateral: Secondary | ICD-10-CM | POA: Diagnosis not present

## 2023-07-25 DIAGNOSIS — H43812 Vitreous degeneration, left eye: Secondary | ICD-10-CM | POA: Diagnosis not present

## 2023-07-25 DIAGNOSIS — H2513 Age-related nuclear cataract, bilateral: Secondary | ICD-10-CM | POA: Diagnosis not present

## 2023-07-25 LAB — HM DIABETES EYE EXAM

## 2023-07-28 ENCOUNTER — Ambulatory Visit: Payer: Self-pay | Admitting: Internal Medicine

## 2023-08-17 ENCOUNTER — Other Ambulatory Visit: Payer: Self-pay | Admitting: Internal Medicine

## 2023-08-17 DIAGNOSIS — E1169 Type 2 diabetes mellitus with other specified complication: Secondary | ICD-10-CM

## 2023-09-13 ENCOUNTER — Encounter: Payer: Self-pay | Admitting: Internal Medicine

## 2023-09-13 ENCOUNTER — Ambulatory Visit: Attending: Internal Medicine | Admitting: Internal Medicine

## 2023-09-13 DIAGNOSIS — I10 Essential (primary) hypertension: Secondary | ICD-10-CM

## 2023-09-13 DIAGNOSIS — Z7984 Long term (current) use of oral hypoglycemic drugs: Secondary | ICD-10-CM | POA: Diagnosis not present

## 2023-09-13 DIAGNOSIS — E119 Type 2 diabetes mellitus without complications: Secondary | ICD-10-CM | POA: Diagnosis not present

## 2023-09-13 DIAGNOSIS — E785 Hyperlipidemia, unspecified: Secondary | ICD-10-CM

## 2023-09-13 DIAGNOSIS — Z6841 Body Mass Index (BMI) 40.0 and over, adult: Secondary | ICD-10-CM

## 2023-09-13 DIAGNOSIS — E1159 Type 2 diabetes mellitus with other circulatory complications: Secondary | ICD-10-CM

## 2023-09-13 DIAGNOSIS — Z2821 Immunization not carried out because of patient refusal: Secondary | ICD-10-CM

## 2023-09-13 DIAGNOSIS — N950 Postmenopausal bleeding: Secondary | ICD-10-CM | POA: Diagnosis not present

## 2023-09-13 DIAGNOSIS — E1169 Type 2 diabetes mellitus with other specified complication: Secondary | ICD-10-CM | POA: Diagnosis not present

## 2023-09-13 DIAGNOSIS — E039 Hypothyroidism, unspecified: Secondary | ICD-10-CM

## 2023-09-13 LAB — POCT GLYCOSYLATED HEMOGLOBIN (HGB A1C): HbA1c, POC (controlled diabetic range): 6.7 % (ref 0.0–7.0)

## 2023-09-13 LAB — GLUCOSE, POCT (MANUAL RESULT ENTRY): POC Glucose: 155 mg/dL — AB (ref 70–99)

## 2023-09-13 MED ORDER — METFORMIN HCL 1000 MG PO TABS: 1000.0000 mg | ORAL_TABLET | Freq: Two times a day (BID) | ORAL | 2 refills | Status: DC

## 2023-09-13 MED ORDER — LISINOPRIL 40 MG PO TABS: 40.0000 mg | ORAL_TABLET | Freq: Every day | ORAL | 3 refills | Status: AC

## 2023-09-13 NOTE — Progress Notes (Signed)
 Patient ID: Jennifer Miller, female    DOB: 05-Jan-1966  MRN: [id number]  CC: chronic ds management  Subjective: Jennifer Miller is a 58 y.o. female who presents for chronic ds management. Her concerns today include:  Pt with hx of CHF, a.fib, HL, HTN, hypothyroid, IDA with hx of fibroid tumors, anxiety and DM.   Discussed the use of AI scribe software for clinical note transcription with the patient, who gave verbal consent to proceed.  History of Present Illness Jennifer Miller is a 58 year old female with type 2 diabetes, atrial fibrillation, and congestive heart failure who presents for follow-up of her chronic conditions.  DM: Results for orders placed or performed in visit on 09/13/23  POCT glucose (manual entry)   Collection Time: 09/13/23 11:07 AM  Result Value Ref Range   POC Glucose 155 (A) 70 - 99 mg/dl  POCT glycosylated hemoglobin (Hb A1C)   Collection Time: 09/13/23 11:21 AM  Result Value Ref Range   Hemoglobin A1C     HbA1c POC (<> result, manual entry)     HbA1c, POC (prediabetic range)     HbA1c, POC (controlled diabetic range) 6.7 0.0 - 7.0 %  Her hemoglobin A1c has decreased to 6.7% with a goal of keeping it below 7%. Current blood sugar is 155 mg/dL postprandial. She has gained 6 pounds since her last visit, despite regular walking 3-4 x/wk for 30-45 min. Attributes wgh gain to increased sugar intake over the past two to three weeks. She is on metformin  1000 mg twice daily.  Atrial fibrillation/CHF/HTN: She is on Pradaxa  for atrial fibrillation and experiences occasional palpitations, particularly after medication. She has not seen her cardiologist recently but usually does so annually. No bleeding episodes while on blood thinners.  No shortness of breath or lower extremity edema.  Reports compliance with taking lisinopril  40 mg daily, chlorthalidone  25 mg daily and metoprolol  50 mg twice a day.  She limits her salt intake.  HL: Taking and tolerating atorvastatin  20 mg  daily.  Hypothyroidism: She continues to take levothyroxine  100 mcg daily.  Due for recheck of thyroid  level.  Postmenopausal bleeding: She has not had any further episode.  Pelvic ultrasound showed large fibroids in the uterus.  I had forwarded the results to her gynecologist Dr. Adriana Hopping who recommended that patient call her office to schedule follow-up as she will need a repeat uterine sampling/biopsy.  Patient states she was called but did not call them back.  However she will give them a call back because she wanted to know what if anything needs to be done about the fibroids.  HM: She declines shingles vaccine.    Patient Active Problem List   Diagnosis Date Noted   Positive colorectal cancer screening using Cologuard test 04/13/2023   Polyp of colon 04/13/2023   Postmenopausal bleeding 12/08/2022   Diabetes mellitus with background retinopathy (HCC) 09/02/2021   De Quervain's tenosynovitis, left 04/02/2021   Pain due to onychomycosis of toenails of both feet 10/18/2019   Type 2 diabetes mellitus with obesity (HCC) 07/24/2019   Generalized anxiety disorder 08/28/2018   Controlled type 2 diabetes mellitus without complication, without long-term current use of insulin (HCC) 12/30/2017   Essential hypertension 12/30/2017   Iron deficiency anemia 12/30/2017   PAF (paroxysmal atrial fibrillation) (HCC) 12/30/2017   Acquired hypothyroidism 12/30/2017   History of colon polyps 12/30/2017   Hyperlipidemia 12/30/2017   Congestive heart failure (HCC) 12/30/2017     Current Outpatient Medications on File Prior  to Visit  Medication Sig Dispense Refill   Accu-Chek Softclix Lancets lancets CHECK BLOOD SUGARS DAILY 100 each 12   atorvastatin  (LIPITOR) 20 MG tablet Take 1 tablet (20 mg total) by mouth daily. 90 tablet 1   Blood Glucose Monitoring Suppl (ACCU-CHEK GUIDE) w/Device KIT UAD 1 kit 0   chlorthalidone  (HYGROTON ) 25 MG tablet TAKE 1 TABLET(25 MG) BY MOUTH DAILY 90 tablet 1    dabigatran  (PRADAXA ) 150 MG CAPS capsule TAKE 1 CAPSULE(150 MG) BY MOUTH TWICE DAILY 180 capsule 3   fluticasone  (FLONASE ) 50 MCG/ACT nasal spray Place 1 spray into both nostrils daily. (Patient taking differently: Place into both nostrils daily.) 16 g 2   glucose blood (ACCU-CHEK GUIDE) test strip CHECK BLOOD SUGARS ONCE DAILY 100 strip 6   levothyroxine  (SYNTHROID ) 100 MCG tablet TAKE 1/2 TABLET BY MOUTH DAILY BEFORE BREAKFAST AS DIRECTED 45 tablet 2   loratadine  (CLARITIN ) 10 MG tablet Take 1 tablet (10 mg total) by mouth daily as needed for allergies. 30 tablet 2   metoprolol  tartrate (LOPRESSOR ) 50 MG tablet Take 1 tablet (50 mg total) by mouth 2 (two) times daily. 180 tablet 2   PROAIR  HFA 108 (90 Base) MCG/ACT inhaler Inhale 2 puffs into the lungs every 6 (six) hours as needed for wheezing or shortness of breath. 17 g 1   VITAMIN D, CHOLECALCIFEROL , PO Take by mouth. (Patient not taking: Reported on 09/13/2023)     No current facility-administered medications on file prior to visit.    No Known Allergies  Social History   Socioeconomic History   Marital status: Single    Spouse name: Not on file   Number of children: 3   Years of education: Not on file   Highest education level: Not on file  Occupational History   Occupation: unemployed  Tobacco Use   Smoking status: Former   Smokeless tobacco: Never  Vaping Use   Vaping status: Never Used  Substance and Sexual Activity   Alcohol use: Yes    Comment: rarely   Drug use: Never   Sexual activity: Yes    Birth control/protection: None  Other Topics Concern   Not on file  Social History Narrative   Not on file   Social Drivers of Health   Financial Resource Strain: Low Risk  (03/28/2023)   Overall Financial Resource Strain (CARDIA)    Difficulty of Paying Living Expenses: Not very hard  Food Insecurity: No Food Insecurity (03/28/2023)   Hunger Vital Sign    Worried About Running Out of Food in the Last Year: Never true     Ran Out of Food in the Last Year: Never true  Transportation Needs: No Transportation Needs (03/28/2023)   PRAPARE - Administrator, Civil Service (Medical): No    Lack of Transportation (Non-Medical): No  Physical Activity: Sufficiently Active (03/28/2023)   Exercise Vital Sign    Days of Exercise per Week: 4 days    Minutes of Exercise per Session: 40 min  Stress: No Stress Concern Present (03/28/2023)   [employer] of Occupational Health - Occupational Stress Questionnaire    Feeling of Stress : Not at all  Social Connections: Socially Isolated (03/28/2023)   Social Connection and Isolation Panel [NHANES]    Frequency of Communication with Friends and Family: Three times a week    Frequency of Social Gatherings with Friends and Family: Three times a week    Attends Religious Services: Never    Active Member of Clubs  or Organizations: No    Attends Banker Meetings: Never    Marital Status: Divorced  Catering manager Violence: Not At Risk (03/28/2023)   Humiliation, Afraid, Rape, and Kick questionnaire    Fear of Current or Ex-Partner: No    Emotionally Abused: No    Physically Abused: No    Sexually Abused: No    Family History  Problem Relation Age of Onset   Diabetes Mother    Hypertension Mother    Dementia Mother    Renal cancer Mother    Diabetes Father    Hypertension Father    Diabetes Maternal Grandmother    Diabetes Paternal Grandmother    Heart disease Paternal Grandmother    BRCA 1/2 Neg Hx    Breast cancer Neg Hx     Past Surgical History:  Procedure Laterality Date   BIOPSY  04/13/2023   Procedure: BIOPSY;  Surgeon: Selena Daily, MD;  Location: [facility];  Service: Gastroenterology;;   COLONOSCOPY WITH PROPOFOL  N/A 04/13/2023   Procedure: COLONOSCOPY WITH PROPOFOL ;  Surgeon: Selena Daily, MD;  Location: [facility] END[facility]COPY;  Service: Gastroenterology;  Laterality: N/A;   DILATION AND CURETTAGE OF UTERUS      KNEE ARTHROSCOPY AND ARTHROTOMY      ROS: Review of Systems Negative except as stated above  PHYSICAL EXAM: BP 114/75 (BP Location: Left Arm, Patient Position: Sitting, Cuff Size: Large)   Pulse 72   Resp 19   Ht 5\' 7"  (1.702 m)   Wt 258 lb 12.8 oz (117.4 kg)   LMP 12/01/2017   SpO2 100%   BMI 40.53 kg/m   Wt Readings from Last 3 Encounters:  09/13/23 258 lb 12.8 oz (117.4 kg)  04/13/23 252 lb 7.5 oz (114.5 kg)  03/28/23 255 lb (115.7 kg)    Physical Exam  General appearance - alert, well appearing, and in no distress Mental status - normal mood, behavior, speech, dress, motor activity, and thought processes Chest - clear to auscultation, no wheezes, rales or rhonchi, symmetric air entry Heart - normal rate, regular rhythm, normal S1, S2, no murmurs, rubs, clicks or gallops Extremities - peripheral pulses normal, no pedal edema, no clubbing or cyanosis Diabetic Foot Exam - Simple   Simple Foot Form Diabetic Foot exam was performed with the following findings: Yes 09/13/2023 12:36 PM  Visual Inspection No deformities, no ulcerations, no other skin breakdown bilaterally: Yes Sensation Testing Intact to touch and monofilament testing bilaterally: Yes Pulse Check Posterior Tibialis and Dorsalis pulse intact bilaterally: Yes Comments         Latest Ref Rng & Units 12/09/2022    2:48 PM 07/26/2022    4:51 PM 03/09/2021   11:19 AM  CMP  Glucose 70 - 99 mg/dL 161  096  045   BUN 6 - 24 mg/dL 16  12  12    Creatinine 0.57 - 1.00 mg/dL 4.09  8.11  9.14   Sodium 134 - 144 mmol/L 138  142  137   Potassium 3.5 - 5.2 mmol/L 4.0  4.5  4.7   Chloride 96 - 106 mmol/L 98  99  99   CO2 20 - 29 mmol/L 27  27  27    Calcium  8.7 - 10.2 mg/dL 78.2  95.6  21.3   Total Protein 6.0 - 8.5 g/dL  7.3    Total Bilirubin 0.0 - 1.2 mg/dL  0.4    Alkaline Phos 44 - 121 IU/L  61    AST 0 -  40 IU/L  35    ALT 0 - 32 IU/L  55     Lipid Panel     Component Value Date/Time   CHOL 114  07/26/2022 1651   TRIG 124 07/26/2022 1651   HDL 42 07/26/2022 1651   CHOLHDL 2.7 07/26/2022 1651   LDLCALC 50 07/26/2022 1651    CBC    Component Value Date/Time   WBC 6.8 07/26/2022 1651   WBC 3.6 (L) 09/29/2019 0859   RBC 4.87 07/26/2022 1651   RBC 5.36 (H) 09/29/2019 0859   HGB 12.8 07/26/2022 1651   HCT 39.4 07/26/2022 1651   PLT 320 07/26/2022 1651   MCV 81 07/26/2022 1651   MCH 26.3 (L) 07/26/2022 1651   MCH 25.7 (L) 09/29/2019 0859   MCHC 32.5 07/26/2022 1651   MCHC 32.6 09/29/2019 0859   RDW 14.9 07/26/2022 1651   LYMPHSABS 1.4 09/29/2019 0859   MONOABS 0.5 09/29/2019 0859   EOSABS 0.0 09/29/2019 0859   BASOSABS 0.0 09/29/2019 0859    ASSESSMENT AND PLAN: 1. Type 2 diabetes mellitus with morbid obesity (HCC) (Primary) At goal I think she would be a good candidate for Ozempic to help decrease her cravings and bring about some meaningful weight loss.  Declined Ozempic and Jardiance  due to possible side effects. Emphasized dietary modifications and exercise. - Continue metformin  1000 mg twice daily. - Encourage reduction of sugar intake. - Advise substitution of snacks with fruits, unsalted nuts, or yogurt. - Encourage continuation of walking 30-45 minutes, 4-5 times per week. - POCT glycosylated hemoglobin (Hb A1C) - POCT glucose (manual entry) - Microalbumin / creatinine urine ratio - metFORMIN  (GLUCOPHAGE ) 1000 MG tablet; Take 1 tablet (1,000 mg total) by mouth 2 (two) times daily with a meal.  Dispense: 180 tablet; Refill: 2 - CBC - Comprehensive metabolic panel with GFR  2. Diabetes mellitus treated with oral medication (HCC) See #1 above.  3. Hypertension associated with type 2 diabetes mellitus (HCC) At goal.  Continue lisinopril  and metoprolol . - lisinopril  (ZESTRIL ) 40 MG tablet; Take 1 tablet (40 mg total) by mouth daily.  Dispense: 90 tablet; Refill: 3  4. Hyperlipidemia associated with type 2 diabetes mellitus (HCC) Last LDL was 50 which was at  goal.  Continue atorvastatin  20 mg daily. - Lipid panel  5. Acquired hypothyroidism Continue levothyroxine . - TSH  6. Postmenopausal bleeding No further episode but still needs follow-up with gynecology.  Advised patient to call Dr. Jodeane Mulligan office back to schedule follow-up appointment  7. Herpes zoster vaccination declined     Patient was given the opportunity to ask questions.  Patient verbalized understanding of the plan and was able to repeat key elements of the plan.   This documentation was completed using Paediatric nurse.  Any transcriptional errors are unintentional.  Orders Placed This Encounter  Procedures   Microalbumin / creatinine urine ratio   CBC   Comprehensive metabolic panel with GFR   Lipid panel   TSH   POCT glycosylated hemoglobin (Hb A1C)   POCT glucose (manual entry)     Requested Prescriptions   Signed Prescriptions Disp Refills   metFORMIN  (GLUCOPHAGE ) 1000 MG tablet 180 tablet 2    Sig: Take 1 tablet (1,000 mg total) by mouth 2 (two) times daily with a meal.   lisinopril  (ZESTRIL ) 40 MG tablet 90 tablet 3    Sig: Take 1 tablet (40 mg total) by mouth daily.    Return in about 4 months (around 01/14/2024).  Bernardo Bridgeman  Lincoln Renshaw, MD, Filomena Huge

## 2023-09-13 NOTE — Patient Instructions (Signed)
 VISIT SUMMARY:  Today, we reviewed your chronic conditions including type 2 diabetes, atrial fibrillation, congestive heart failure, hypertension, hypothyroidism, and uterine fibroids. We discussed your recent lab results, symptoms, and made adjustments to your treatment plan where necessary.  YOUR PLAN:  -TYPE 2 DIABETES MELLITUS: Type 2 diabetes is a condition where your body does not use insulin properly, leading to high blood sugar levels. Your hemoglobin A1c has improved to 6.7%, but you have gained 6 pounds due to increased sugar intake. Continue taking metformin  1000 mg twice daily, reduce your sugar intake, and substitute snacks with fruits, unsalted nuts, or yogurt. Keep walking for 30-45 minutes, 4-5 times per week.  -ATRIAL FIBRILLATION: Atrial fibrillation is an irregular and often rapid heart rate that can increase your risk of strokes. You are managing this with Pradaxa  and experiencing occasional palpitations. Continue taking Pradaxa  and schedule a follow-up with your cardiologist.  -CONGESTIVE HEART FAILURE: Congestive heart failure is a condition where your heart does not pump blood as well as it should. You are experiencing periodic chest pain, likely due to muscle strain. Continue taking lisinopril  40 mg daily, chlorthalidone  25 mg daily, and metoprolol  50 mg twice daily. Your lisinopril  prescription has been refilled.  -HYPERTENSION: Hypertension is high blood pressure. Your blood pressure is well-controlled with your current medications. Continue your current antihypertensive regimen.  -HYPOTHYROIDISM: Hypothyroidism is a condition where your thyroid  gland does not produce enough thyroid  hormone. Continue taking levothyroxine  and complete the ordered thyroid  function tests.  -UTERINE FIBROIDS: Uterine fibroids are noncancerous growths in the uterus. You have not had recent postmenopausal bleeding, but your fibroids have grown together. Contact your gynecologist for a follow-up  appointment.  INSTRUCTIONS:  Please schedule a follow-up appointment with your cardiologist for your atrial fibrillation. Also, contact your gynecologist for a follow-up regarding your uterine fibroids. Complete the ordered thyroid  function tests. Continue with your current medications and lifestyle modifications as discussed.

## 2023-09-14 ENCOUNTER — Ambulatory Visit: Payer: Self-pay | Admitting: Internal Medicine

## 2023-09-15 LAB — COMPREHENSIVE METABOLIC PANEL WITH GFR
ALT: 31 IU/L (ref 0–32)
AST: 24 IU/L (ref 0–40)
Albumin: 4.5 g/dL (ref 3.8–4.9)
Alkaline Phosphatase: 64 IU/L (ref 44–121)
BUN/Creatinine Ratio: 15 (ref 9–23)
BUN: 13 mg/dL (ref 6–24)
Bilirubin Total: 0.4 mg/dL (ref 0.0–1.2)
CO2: 25 mmol/L (ref 20–29)
Calcium: 10.4 mg/dL — ABNORMAL HIGH (ref 8.7–10.2)
Chloride: 100 mmol/L (ref 96–106)
Creatinine, Ser: 0.86 mg/dL (ref 0.57–1.00)
Globulin, Total: 3.1 g/dL (ref 1.5–4.5)
Glucose: 111 mg/dL — ABNORMAL HIGH (ref 70–99)
Potassium: 4.7 mmol/L (ref 3.5–5.2)
Sodium: 141 mmol/L (ref 134–144)
Total Protein: 7.6 g/dL (ref 6.0–8.5)
eGFR: 79 mL/min/{1.73_m2} (ref >59)

## 2023-09-15 LAB — LIPID PANEL
Chol/HDL Ratio: 2.8 ratio (ref 0.0–4.4)
Cholesterol, Total: 108 mg/dL (ref 100–199)
HDL: 39 mg/dL — ABNORMAL LOW (ref >39)
LDL Chol Calc (NIH): 51 mg/dL (ref 0–99)
Triglycerides: 91 mg/dL (ref 0–149)
VLDL Cholesterol Cal: 18 mg/dL (ref 5–40)

## 2023-09-15 LAB — CBC
Hematocrit: 42.5 % (ref 34.0–46.6)
Hemoglobin: 13.2 g/dL (ref 11.1–15.9)
MCH: 25.8 pg — ABNORMAL LOW (ref 26.6–33.0)
MCHC: 31.1 g/dL — ABNORMAL LOW (ref 31.5–35.7)
MCV: 83 fL (ref 79–97)
Platelets: 338 10*3/uL (ref 150–450)
RBC: 5.12 x10E6/uL (ref 3.77–5.28)
RDW: 14.9 % (ref 11.7–15.4)
WBC: 6.9 10*3/uL (ref 3.4–10.8)

## 2023-09-15 LAB — TSH: TSH: 2.07 u[IU]/mL (ref 0.450–4.500)

## 2023-09-15 LAB — MICROALBUMIN / CREATININE URINE RATIO
Creatinine, Urine: 157.1 mg/dL
Microalb/Creat Ratio: 25 mg/g{creat} (ref 0–29)
Microalbumin, Urine: 39.8 ug/mL

## 2023-10-09 ENCOUNTER — Other Ambulatory Visit: Payer: Self-pay | Admitting: Internal Medicine

## 2023-10-09 DIAGNOSIS — E1169 Type 2 diabetes mellitus with other specified complication: Secondary | ICD-10-CM

## 2023-10-10 ENCOUNTER — Other Ambulatory Visit: Payer: Self-pay | Admitting: Internal Medicine

## 2023-10-10 NOTE — Telephone Encounter (Signed)
 Copied from CRM [id number]. Topic: Clinical - Medication Refill >> Oct 10, 2023 11:42 AM Jalayah J wrote: Medication: metFORMIN  (GLUCOPHAGE ) 1000 MG tablet  Has the patient contacted their pharmacy? Yes (Agent: If no, request that the patient contact the pharmacy for the refill. If patient does not wish to contact the pharmacy document the reason why and proceed with request.) (Agent: If yes, when and what did the pharmacy advise?)  This is the patient's preferred pharmacy:  [id number] - [place], [place] - [address] AT [facility] OF [address] [facility] [address] [place] [address] Phone: [phone] Fax: [phone]    Is this the correct pharmacy for this prescription? Yes If no, delete pharmacy and type the correct one.   Has the prescription been filled recently? No  Is the patient out of the medication? Yes  Has the patient been seen for an appointment in the last year OR does the patient have an upcoming appointment? Yes  Can we respond through MyChart? Yes  Agent: Please be advised that Rx refills may take up to 3 business days. We ask that you follow-up with your pharmacy.

## 2023-10-11 MED ORDER — METFORMIN HCL 1000 MG PO TABS: 1000.0000 mg | ORAL_TABLET | Freq: Two times a day (BID) | ORAL | 2 refills | Status: AC

## 2023-10-11 NOTE — Telephone Encounter (Signed)
 Labs in date except B12 level, and LOV 09/13/2023.  Rx not sent in so I've sent it in.    Requested Prescriptions  Pending Prescriptions Disp Refills   metFORMIN  (GLUCOPHAGE ) 1000 MG tablet 180 tablet 2    Sig: Take 1 tablet (1,000 mg total) by mouth 2 (two) times daily with a meal.     Endocrinology:  Diabetes - Biguanides Failed - 10/11/2023 12:18 PM      Failed - B12 Level in normal range and within 720 days    No results found for: "VITAMINB12"       Failed - Valid encounter within last 6 months    Recent Outpatient Visits           4 weeks ago Type 2 diabetes mellitus with morbid obesity (HCC)   [facility] [place]. [facility] Concetta Dee B, MD   6 months ago Type 2 diabetes mellitus with morbid obesity [facility])   [facility] - [facility] Dept Of [place]. [facility] Concetta Dee B, MD   10 months ago Type 2 diabetes mellitus with morbid obesity [facility])   [facility] - [facility] Dept Of [place]. [facility] Concetta Dee B, MD   1 year ago Type 2 diabetes mellitus with morbid obesity [facility])   [facility] - A Dept Of [place]. [facility] Concetta Dee B, MD   1 year ago Type 2 diabetes mellitus with morbid obesity [facility])   [facility] - A Dept Of [place]. [facility] Lawrance Presume, MD       Future Appointments             In 3 months Dellar Fenton, DO [facility]            Failed - CBC within normal limits and completed in the last 12 months    WBC  Date Value Ref Range Status  09/13/2023 6.9 3.4 - 10.8 x10E3/uL Final  09/29/2019 3.6 (L) 4.0 - 10.5 K/uL Final   RBC  Date Value Ref Range Status  09/13/2023 5.12 3.77 - 5.28 x10E6/uL Final  09/29/2019 5.36 (H) 3.87 - 5.11 MIL/uL Final   Hemoglobin  Date Value Ref Range Status  09/13/2023 13.2 11.1 - 15.9 g/dL Final   Hematocrit  Date  Value Ref Range Status  09/13/2023 42.5 34.0 - 46.6 % Final   MCHC  Date Value Ref Range Status  09/13/2023 31.1 (L) 31.5 - 35.7 g/dL Final  09/81/1914 78.2 30.0 - 36.0 g/dL Final   [facility]  Date Value Ref Range Status  09/13/2023 25.8 (L) 26.6 - 33.0 pg Final  09/29/2019 25.7 (L) 26.0 - 34.0 pg Final   MCV  Date Value Ref Range Status  09/13/2023 83 79 - 97 fL Final   No results found for: "PLTCOUNTKUC", "LABPLAT", "POCPLA" RDW  Date Value Ref Range Status  09/13/2023 14.9 11.7 - 15.4 % Final         Passed - Cr in normal range and within 360 days    Creatinine, Ser  Date Value Ref Range Status  09/13/2023 0.86 0.57 - 1.00 mg/dL Final         Passed - HBA1C is between 0 and 7.9 and within 180 days    HbA1c, POC (prediabetic range)  Date Value Ref Range Status  05/12/2018 5.9 5.7 -  6.4 % Final   HbA1c, POC (controlled diabetic range)  Date Value Ref Range Status  09/13/2023 6.7 0.0 - 7.0 % Final         Passed - eGFR in normal range and within 360 days    GFR calc Af Amer  Date Value Ref Range Status  09/29/2019 >60 >60 mL/min Final   GFR, Estimated  Date Value Ref Range Status  03/03/2020 >60 >60 mL/min Final    Comment:    (NOTE) Calculated using the CKD-EPI Creatinine Equation (2021)    eGFR  Date Value Ref Range Status  09/13/2023 79 >59 mL/min/1.73 Final

## 2023-10-22 ENCOUNTER — Other Ambulatory Visit: Payer: Self-pay | Admitting: Internal Medicine

## 2023-10-22 DIAGNOSIS — I10 Essential (primary) hypertension: Secondary | ICD-10-CM

## 2023-10-27 ENCOUNTER — Other Ambulatory Visit: Payer: Self-pay | Admitting: Internal Medicine

## 2023-10-27 DIAGNOSIS — E1169 Type 2 diabetes mellitus with other specified complication: Secondary | ICD-10-CM

## 2023-10-30 ENCOUNTER — Ambulatory Visit (HOSPITAL_COMMUNITY)
Admission: EM | Admit: 2023-10-30 | Discharge: 2023-10-30 | Disposition: A | Discharge status: Home / Self Care | Attending: Nurse Practitioner | Admitting: Nurse Practitioner

## 2023-10-30 ENCOUNTER — Encounter (HOSPITAL_COMMUNITY): Payer: Self-pay

## 2023-10-30 DIAGNOSIS — J069 Acute upper respiratory infection, unspecified: Secondary | ICD-10-CM | POA: Diagnosis not present

## 2023-10-30 MED ORDER — PROMETHAZINE-DM 6.25-15 MG/5ML PO SYRP: 10.0000 mL | ORAL_SOLUTION | Freq: Four times a day (QID) | ORAL | 0 refills | Status: AC | PRN

## 2023-10-30 MED ORDER — MUCINEX DM MAXIMUM STRENGTH 60-1200 MG PO TB12: 1.0000 | ORAL_TABLET | Freq: Two times a day (BID) | ORAL | 0 refills | Status: AC

## 2023-10-30 MED ORDER — BENZONATATE 200 MG PO CAPS: 200.0000 mg | ORAL_CAPSULE | Freq: Three times a day (TID) | ORAL | 0 refills | Status: AC | PRN

## 2023-10-30 NOTE — ED Triage Notes (Signed)
 Patient c/o a productive cough with clear sputum. Patient states she has been taking allergy pills and it has helped some, but states cough is worse at night.

## 2023-10-30 NOTE — ED Provider Notes (Signed)
 [facility]    CSN: [id number] Arrival date & time: 10/30/23  1727      History   Chief Complaint Chief Complaint  Patient presents with   Cough    HPI Jennifer Miller is a 58 y.o. female.   Discussed the use of AI scribe software for clinical note transcription with the patient, who gave verbal consent to proceed.   Jennifer Miller is a 58 y.o. female with a persistent dry cough and associated symptoms, which she believes may be related to allergies or weather changes. The patient reports a history of bronchitis. The patient describes her main symptom as a real dry cough that's real, real harsh. She notes that this cough occurs intermittently, often coinciding with changes in weather. The cough is severe enough to cause urinary incontinence at times. She emphasizes that the cough feels localized to her throat rather than her chest, and describes her throat as really dry. The patient reports that her symptoms worsen when the air conditioner is on. She has also had some nasal congestion on one side, which has since improved. She attributes this improvement to starting Claritin  yesterday and today. The patient denies any nausea, vomiting, diarrhea, or headache. She also denies being around anyone sick recently. The onset of her current symptoms was approximately two days ago. The patient reports a history of bronchitis and mentions that she experiences wheezing, particularly when it's colder in the morning. She has used her inhaler for these symptoms but reiterates that her current issue feels more throat-related than chest-related. She denies smoking or vaping.  The following portions of the patient's history were reviewed and updated as appropriate: allergies, current medications, past family history, past medical history, past social history, past surgical history, and problem list.      Past Medical History:  Diagnosis Date   Congestive heart failure (CHF) (HCC)    Diabetes  mellitus without complication (HCC)    HLD (hyperlipidemia)    Hypertension    Paroxysmal A-fib (HCC)     Patient Active Problem List   Diagnosis Date Noted   Positive colorectal cancer screening using Cologuard test 04/13/2023   Polyp of colon 04/13/2023   Postmenopausal bleeding 12/08/2022   Diabetes mellitus with background retinopathy (HCC) 09/02/2021   De Quervain's tenosynovitis, left 04/02/2021   Pain due to onychomycosis of toenails of both feet 10/18/2019   Type 2 diabetes mellitus with obesity (HCC) 07/24/2019   Generalized anxiety disorder 08/28/2018   Controlled type 2 diabetes mellitus without complication, without long-term current use of insulin (HCC) 12/30/2017   Essential hypertension 12/30/2017   Iron deficiency anemia 12/30/2017   PAF (paroxysmal atrial fibrillation) (HCC) 12/30/2017   Acquired hypothyroidism 12/30/2017   History of colon polyps 12/30/2017   Hyperlipidemia 12/30/2017   Congestive heart failure (HCC) 12/30/2017    Past Surgical History:  Procedure Laterality Date   BIOPSY  04/13/2023   Procedure: BIOPSY;  Surgeon: Unk Corinn Skiff, MD;  Location: [facility];  Service: Gastroenterology;;   COLONOSCOPY WITH PROPOFOL  N/A 04/13/2023   Procedure: COLONOSCOPY WITH PROPOFOL ;  Surgeon: Unk Corinn Skiff, MD;  Location: [facility];  Service: Gastroenterology;  Laterality: N/A;   DILATION AND CURETTAGE OF UTERUS     KNEE ARTHROSCOPY AND ARTHROTOMY      OB History     Gravida  4   Para  3   Term  3   Preterm      AB  1   Living  3  SAB      IAB  1   Ectopic      Multiple      Live Births  3            Home Medications    Prior to Admission medications   Medication Sig Start Date End Date Taking? Authorizing Provider  benzonatate  (TESSALON ) 200 MG capsule Take 1 capsule (200 mg total) by mouth 3 (three) times daily as needed for cough. 10/30/23  Yes Elodie Panameno, Lucie, FNP  Dextromethorphan-guaiFENesin  (MUCINEX DM MAXIMUM STRENGTH) 60-1200 MG TB12 Take 1 tablet by mouth 2 (two) times daily. 10/30/23  Yes Iola Lucie, FNP  promethazine-dextromethorphan (PROMETHAZINE-DM) 6.25-15 MG/5ML syrup Take 10 mLs by mouth every 6 (six) hours as needed for cough. 10/30/23  Yes Iola Lucie, FNP  ACCU-CHEK GUIDE TEST test strip USE TO CHECK BLOOD SUGARS ONCE DAILY 10/27/23   Vicci Barnie NOVAK, MD  Accu-Chek Softclix Lancets lancets USE TO CHECK BLOOD SUGAR EVERY DAY 10/27/23   Vicci Barnie NOVAK, MD  atorvastatin  (LIPITOR) 20 MG tablet TAKE 1 TABLET(20 MG) BY MOUTH DAILY 10/27/23   Vicci Barnie NOVAK, MD  Blood Glucose Monitoring Suppl (ACCU-CHEK GUIDE) w/Device KIT UAD 06/30/21   Vicci Barnie NOVAK, MD  chlorthalidone  (HYGROTON ) 25 MG tablet TAKE 1 TABLET(25 MG) BY MOUTH DAILY 10/24/23   Vicci Barnie NOVAK, MD  dabigatran  (PRADAXA ) 150 MG CAPS capsule TAKE 1 CAPSULE(150 MG) BY MOUTH TWICE DAILY 01/14/23   Hammock, Sheri, NP  fluticasone  (FLONASE ) 50 MCG/ACT nasal spray Place 1 spray into both nostrils daily. Patient taking differently: Place into both nostrils daily. 03/28/23   Vicci Barnie NOVAK, MD  levothyroxine  (SYNTHROID ) 100 MCG tablet TAKE 1/2 TABLET BY MOUTH DAILY BEFORE BREAKFAST AS DIRECTED 11/25/22   Vicci Barnie NOVAK, MD  lisinopril  (ZESTRIL ) 40 MG tablet Take 1 tablet (40 mg total) by mouth daily. 09/13/23   Vicci Barnie NOVAK, MD  loratadine  (CLARITIN ) 10 MG tablet Take 1 tablet (10 mg total) by mouth daily as needed for allergies. 03/23/19   Vicci Barnie NOVAK, MD  metFORMIN  (GLUCOPHAGE ) 1000 MG tablet Take 1 tablet (1,000 mg total) by mouth 2 (two) times daily with a meal. 10/11/23   Vicci Barnie NOVAK, MD  metoprolol  tartrate (LOPRESSOR ) 50 MG tablet Take 1 tablet (50 mg total) by mouth 2 (two) times daily. 02/21/23   Darliss Rogue, MD  PROAIR  HFA 108 (90 Base) MCG/ACT inhaler Inhale 2 puffs into the lungs every 6 (six) hours as needed for wheezing or shortness of breath. 03/28/23    Vicci Barnie NOVAK, MD  VITAMIN D, CHOLECALCIFEROL , PO Take by mouth. Patient not taking: Reported on 09/13/2023    [provider]    Family History Family History  Problem Relation Age of Onset   Diabetes Mother    Hypertension Mother    Dementia Mother    Renal cancer Mother    Diabetes Father    Hypertension Father    Diabetes Maternal Grandmother    Diabetes Paternal Grandmother    Heart disease Paternal Grandmother    BRCA 1/2 Neg Hx    Breast cancer Neg Hx     Social History Social History   Tobacco Use   Smoking status: Former   Smokeless tobacco: Never  Advertising account planner   Vaping status: Never Used  Substance Use Topics   Alcohol use: Yes    Comment: rarely   Drug use: Never     Allergies   Patient has no known allergies.  Review of Systems Review of Systems  Constitutional:  Negative for fever.  HENT:  Positive for congestion. Negative for postnasal drip, rhinorrhea, sneezing and sore throat.   Respiratory:  Positive for cough (dry, nonproductive) and wheezing (a little). Negative for chest tightness.   Gastrointestinal:  Negative for diarrhea, nausea and vomiting.  Genitourinary:        Stress incontinence from coughing   Neurological:  Negative for headaches.  All other systems reviewed and are negative.    Physical Exam Triage Vital Signs ED Triage Vitals [10/30/23 1806]  Encounter Vitals Group     BP 104/72     Girls Systolic BP Percentile      Girls Diastolic BP Percentile      Boys Systolic BP Percentile      Boys Diastolic BP Percentile      Pulse Rate 86     Resp 16     Temp 98.1 F (36.7 C)     Temp Source Oral     SpO2 93 %     Weight      Height      Head Circumference      Peak Flow      Pain Score 0     Pain Loc      Pain Education      Exclude from Growth Chart    No data found.  Updated Vital Signs BP 104/72 (BP Location: Right Arm)   Pulse 86   Temp 98.1 F (36.7 C) (Oral)   Resp 16   LMP 12/01/2017    SpO2 93%   Visual Acuity Right Eye Distance:   Left Eye Distance:   Bilateral Distance:    Right Eye Near:   Left Eye Near:    Bilateral Near:     Physical Exam Vitals reviewed.  Constitutional:      General: She is awake. She is not in acute distress.    Appearance: Normal appearance. She is well-developed. She is not ill-appearing, toxic-appearing or diaphoretic.  HENT:     Head: Normocephalic.     Right Ear: Tympanic membrane, ear canal and external ear normal. No drainage, swelling or tenderness. No middle ear effusion. Tympanic membrane is not erythematous.     Left Ear: Tympanic membrane, ear canal and external ear normal. No drainage, swelling or tenderness.  No middle ear effusion. Tympanic membrane is not erythematous.     Nose: Congestion present. No rhinorrhea.     Mouth/Throat:     Lips: Pink.     Mouth: Mucous membranes are moist.     Pharynx: No pharyngeal swelling, oropharyngeal exudate, posterior oropharyngeal erythema or uvula swelling.     Tonsils: No tonsillar exudate or tonsillar abscesses.   Eyes:     General: Vision grossly intact.     Conjunctiva/sclera: Conjunctivae normal.    Cardiovascular:     Rate and Rhythm: Normal rate.     Heart sounds: Normal heart sounds.  Pulmonary:     Effort: Pulmonary effort is normal. No tachypnea or respiratory distress.     Breath sounds: Normal breath sounds and air entry.   Musculoskeletal:        General: Normal range of motion.     Cervical back: Normal range of motion and neck supple.  Lymphadenopathy:     Cervical: No cervical adenopathy.   Skin:    General: Skin is warm and dry.   Neurological:     General: No focal deficit present.  Mental Status: She is alert and oriented to person, place, and time.   Psychiatric:        Behavior: Behavior is cooperative.      UC Treatments / Results  Labs (all labs ordered are listed, but only abnormal results are displayed) Labs Reviewed - No data to  display  EKG   Radiology No results found.  Procedures Procedures (including critical care time)  Medications Ordered in UC Medications - No data to display  Initial Impression / Assessment and Plan / UC Course  I have reviewed the triage vital signs and the nursing notes.  Pertinent labs & imaging results that were available during my care of the patient were reviewed by me and considered in my medical decision making (see chart for details).    Today's evaluation has revealed no signs of a dangerous process. Discussed diagnosis with patient and/or guardian. Patient and/or guardian aware of their diagnosis, possible red flag symptoms to watch out for and need for close follow up. Patient and/or guardian understands verbal and written discharge instructions. Patient and/or guardian comfortable with plan and disposition.  Patient and/or guardian has a clear mental status at this time, good insight into illness (after discussion and teaching) and has clear judgment to make decisions regarding their care  Documentation was completed with the aid of voice recognition software. Transcription may contain typographical errors. Final Clinical Impressions(s) / UC Diagnoses   Final diagnoses:  Viral URI with cough     Discharge Instructions      Your symptoms are most likely caused by a respiratory infection, which affects areas like your nose, throat, or lungs. Since your illness is caused by a virus, antibiotics won't help because they only treat infections caused by bacteria.  Take the medications that were prescribed to you as directed. If you have a fever, headache, or body aches, you can also take Tylenol  or ibuprofen to help you feel more comfortable. Be sure to drink plenty of fluids to stay hydrated--aim for enough to keep your urine a pale yellow color. This will also help to thin mucus and make it easier to clear from your body.   Using a cool mist humidifier at home to keep  humidity levels above 50% can be helpful. You can also inhale steam for 10 to 15 minutes, 3 to 4 times a day. This can be done by sitting in the bathroom with a hot shower running, or by using over-the-counter vapor shower tablets to help with nasal congestion. Try to avoid cool or dry air as much as possible. When you sleep, keep your head elevated to help reduce post-nasal drainage. Be sure to get enough rest every night to support your recovery. Don't forget to replace your toothbrush once you start feeling better.   It's normal for a cough to linger for several weeks after a respiratory illness, even after other symptoms have resolved. This happens because the airways remain irritated and take time to fully heal. As long as the cough gradually improves and there are no new concerning symptoms, this is part of the normal recovery process.  If your symptoms get worse or if you develop any new or concerning symptoms, go to the emergency room right away. If you're not feeling better in a few days, follow up with your primary care provider.            ED Prescriptions     Medication Sig Dispense Auth. Provider   benzonatate  (TESSALON ) 200 MG  capsule Take 1 capsule (200 mg total) by mouth 3 (three) times daily as needed for cough. 30 capsule Daltin Crist, [place], FNP   Dextromethorphan-guaiFENesin (MUCINEX DM MAXIMUM STRENGTH) 60-1200 MG TB12 Take 1 tablet by mouth 2 (two) times daily. 20 tablet Iola Lukes, FNP   promethazine-dextromethorphan (PROMETHAZINE-DM) 6.25-15 MG/5ML syrup Take 10 mLs by mouth every 6 (six) hours as needed for cough. 118 mL Iola Lukes, FNP      PDMP not reviewed this encounter.   Iola Lukes, [place] 10/30/23 [phone]

## 2023-10-30 NOTE — Discharge Instructions (Addendum)
 Your symptoms are most likely caused by a respiratory infection, which affects areas like your nose, throat, or lungs. Since your illness is caused by a virus, antibiotics won't help because they only treat infections caused by bacteria.  Take the medications that were prescribed to you as directed. If you have a fever, headache, or body aches, you can also take Tylenol  or ibuprofen to help you feel more comfortable. Be sure to drink plenty of fluids to stay hydrated--aim for enough to keep your urine a pale yellow color. This will also help to thin mucus and make it easier to clear from your body.   Using a cool mist humidifier at home to keep humidity levels above 50% can be helpful. You can also inhale steam for 10 to 15 minutes, 3 to 4 times a day. This can be done by sitting in the bathroom with a hot shower running, or by using over-the-counter vapor shower tablets to help with nasal congestion. Try to avoid cool or dry air as much as possible. When you sleep, keep your head elevated to help reduce post-nasal drainage. Be sure to get enough rest every night to support your recovery. Don't forget to replace your toothbrush once you start feeling better.   It's normal for a cough to linger for several weeks after a respiratory illness, even after other symptoms have resolved. This happens because the airways remain irritated and take time to fully heal. As long as the cough gradually improves and there are no new concerning symptoms, this is part of the normal recovery process.  If your symptoms get worse or if you develop any new or concerning symptoms, go to the emergency room right away. If you're not feeling better in a few days, follow up with your primary care provider.

## 2024-01-16 ENCOUNTER — Ambulatory Visit: Admitting: Internal Medicine

## 2024-01-16 ENCOUNTER — Ambulatory Visit: Admitting: Dermatology

## 2024-01-21 ENCOUNTER — Other Ambulatory Visit: Payer: Self-pay | Admitting: Cardiology

## 2024-01-26 ENCOUNTER — Other Ambulatory Visit: Payer: Self-pay | Admitting: Internal Medicine

## 2024-01-26 DIAGNOSIS — E039 Hypothyroidism, unspecified: Secondary | ICD-10-CM

## 2024-01-27 NOTE — Telephone Encounter (Signed)
 Requested Prescriptions  Pending Prescriptions Disp Refills   levothyroxine  (SYNTHROID ) 100 MCG tablet [Pharmacy Med Name: LEVOTHYROXINE  0.100MG  ( ) TAB] 45 tablet 2    Sig: TAKE 1/2 TABLET BY MOUTH DAILY BEFORE BREAKFAST AS DIRECTED     Endocrinology:  Hypothyroid Agents Passed - 01/27/2024  3:24 PM      Passed - TSH in normal range and within 360 days    TSH  Date Value Ref Range Status  09/13/2023 2.070 0.450 - 4.500 uIU/mL Final         Passed - Valid encounter within last 12 months    Recent Outpatient Visits           4 months ago Type 2 diabetes mellitus with morbid obesity (HCC)   [facility] [place] Vicci Sober B, MD   10 months ago Type 2 diabetes mellitus with morbid obesity [facility])   [facility] [place]. [facility] Vicci Sober B, MD   1 year ago Type 2 diabetes mellitus with morbid obesity [facility])   [facility] [place]. [facility] Vicci Sober B, MD   1 year ago Type 2 diabetes mellitus with morbid obesity [facility])   [facility] [place]. [facility] Vicci Sober B, MD   2 years ago Type 2 diabetes mellitus with morbid obesity [facility])   [facility] Vicci Sober NOVAK, MD       Future Appointments             In 3 weeks Agbor-Etang, [place], MD [facility] [place]   In 4 months Alm Delon SAILOR, DO [facility]

## 2024-02-15 ENCOUNTER — Other Ambulatory Visit: Payer: Self-pay | Admitting: Cardiology

## 2024-02-15 DIAGNOSIS — I480 Paroxysmal atrial fibrillation: Secondary | ICD-10-CM

## 2024-02-17 ENCOUNTER — Ambulatory Visit: Attending: Cardiology | Admitting: Cardiology

## 2024-02-17 ENCOUNTER — Encounter: Payer: Self-pay | Admitting: Cardiology

## 2024-02-17 VITALS — BP 130/77 | HR 78 | Ht 66.0 in | Wt 256.4 lb

## 2024-02-17 DIAGNOSIS — I10 Essential (primary) hypertension: Secondary | ICD-10-CM | POA: Insufficient documentation

## 2024-02-17 DIAGNOSIS — E782 Mixed hyperlipidemia: Secondary | ICD-10-CM | POA: Insufficient documentation

## 2024-02-17 DIAGNOSIS — I480 Paroxysmal atrial fibrillation: Secondary | ICD-10-CM | POA: Diagnosis not present

## 2024-02-17 MED ORDER — DABIGATRAN ETEXILATE MESYLATE 150 MG PO CAPS: ORAL_CAPSULE | ORAL | 3 refills | Status: AC

## 2024-02-17 NOTE — Patient Instructions (Signed)

## 2024-02-17 NOTE — Progress Notes (Signed)
 Cardiology Office Note:    Date:  02/17/2024   ID:  Jennifer Miller, DOB 10-05-1965, MRN [id number]  PCP:  Vicci Barnie NOVAK, MD  Cardiologist:  Redell Cave, MD  Electrophysiologist:  None   Referring MD: Vicci Barnie NOVAK, MD   Chief Complaint  Patient presents with   Follow-up    12 month follow up pt has been doing well with no complaints of chest pain, chest pressure or SOB, medciation reviewed verbally with patient    History of Present Illness:    Jennifer Miller is a 58 y.o. female with a hx of hypertension, paroxysmal atrial fibrillation on Pradaxa , hyperlipidemia,  presents diabetes,who presents for follow-up.    Denies chest pain or shortness of breath, denies palpitations.  Tolerating Pradaxa , has no bleeding issues.  BP adequately controlled at home.  States having a little cold and congestion.  Likely due to change in temperature/weather.  Prior notes Echo 03/2019 EF 55 to 60%, impaired relaxation. Patient was diagnosed with congestive heart failure about 19 years ago after having a baby.  She was fine before.  She recently moved to the area from [place] .  She was placed on medications including Metroprolol tartrate, lisinopril , Lasix  40 mg every day.  She denies edema, orthopnea.  About 6 years ago, she states eating 3 hot dogs and drinking some soda while at her mother's home.  She later on noticed her heart was beating fast and irregular.  This prompted her to go to the ED.  She was evaluated and told she was in atrial fibrillation.  She was started on anticoagulation which she has taken since, currently takes Pradaxa  twice daily.  She is a former smoker, denies any history of MI.  Amlodipine  was stopped due to edema.  Past Medical History:  Diagnosis Date   Congestive heart failure (CHF) (HCC)    Diabetes mellitus without complication (HCC)    HLD (hyperlipidemia)    Hypertension    Paroxysmal A-fib [facility])     Past Surgical History:  Procedure Laterality  Date   BIOPSY  04/13/2023   Procedure: BIOPSY;  Surgeon: Unk Corinn Skiff, MD;  Location: [facility] ENDOSCOPY;  Service: Gastroenterology;;   COLONOSCOPY WITH PROPOFOL  N/A 04/13/2023   Procedure: COLONOSCOPY WITH PROPOFOL ;  Surgeon: Unk Corinn Skiff, MD;  Location: [facility] ENDOSCOPY;  Service: Gastroenterology;  Laterality: N/A;   DILATION AND CURETTAGE OF UTERUS     KNEE ARTHROSCOPY AND ARTHROTOMY      Current Medications: Current Meds  Medication Sig   ACCU-CHEK GUIDE TEST test strip USE TO CHECK BLOOD SUGARS ONCE DAILY   Accu-Chek Softclix Lancets lancets USE TO CHECK BLOOD SUGAR EVERY DAY   atorvastatin  (LIPITOR) 20 MG tablet TAKE 1 TABLET(20 MG) BY MOUTH DAILY   benzonatate  (TESSALON ) 200 MG capsule Take 1 capsule (200 mg total) by mouth 3 (three) times daily as needed for cough.   Blood Glucose Monitoring Suppl (ACCU-CHEK GUIDE) w/Device KIT UAD   chlorthalidone  (HYGROTON ) 25 MG tablet TAKE 1 TABLET(25 MG) BY MOUTH DAILY   Dextromethorphan-guaiFENesin  (MUCINEX  DM MAXIMUM STRENGTH) 60-1200 MG TB12 Take 1 tablet by mouth 2 (two) times daily.   fluticasone  (FLONASE ) 50 MCG/ACT nasal spray Place 1 spray into both nostrils daily.   levothyroxine  (SYNTHROID ) 100 MCG tablet TAKE 1/2 TABLET BY MOUTH DAILY BEFORE BREAKFAST AS DIRECTED   lisinopril  (ZESTRIL ) 40 MG tablet Take 1 tablet (40 mg total) by mouth daily.   loratadine  (CLARITIN ) 10 MG tablet Take 1 tablet (10 mg total) by  mouth daily as needed for allergies.   metFORMIN  (GLUCOPHAGE ) 1000 MG tablet Take 1 tablet (1,000 mg total) by mouth 2 (two) times daily with a meal.   metoprolol  tartrate (LOPRESSOR ) 50 MG tablet TAKE 1 TABLET(50 MG) BY MOUTH TWICE DAILY   PROAIR  HFA 108 (90 Base) MCG/ACT inhaler Inhale 2 puffs into the lungs every 6 (six) hours as needed for wheezing or shortness of breath.   promethazine -dextromethorphan (PROMETHAZINE -DM) 6.25-15 MG/5ML syrup Take 10 mLs by mouth every 6 (six) hours as needed for cough.   VITAMIN D,  CHOLECALCIFEROL , PO Take by mouth.   [DISCONTINUED] dabigatran  (PRADAXA ) 150 MG CAPS capsule TAKE 1 CAPSULE BY MOUTH TWICE DAILY( EVERY TWELVE HOURS)     Allergies:   Patient has no known allergies.   Social History   Socioeconomic History   Marital status: Single    Spouse name: Not on file   Number of children: 3   Years of education: Not on file   Highest education level: Not on file  Occupational History   Occupation: unemployed  Tobacco Use   Smoking status: Former   Smokeless tobacco: Never  Vaping Use   Vaping status: Never Used  Substance and Sexual Activity   Alcohol use: Yes    Comment: rarely   Drug use: Never   Sexual activity: Yes    Birth control/protection: None  Other Topics Concern   Not on file  Social History Narrative   Not on file   Social Drivers of Health   Financial Resource Strain: Low Risk  (03/28/2023)   Overall Financial Resource Strain (CARDIA)    Difficulty of Paying Living Expenses: Not very hard  Food Insecurity: No Food Insecurity (03/28/2023)   Hunger Vital Sign    Worried About Running Out of Food in the Last Year: Never true    Ran Out of Food in the Last Year: Never true  Transportation Needs: No Transportation Needs (03/28/2023)   PRAPARE - Administrator, Civil Service (Medical): No    Lack of Transportation (Non-Medical): No  Physical Activity: Sufficiently Active (03/28/2023)   Exercise Vital Sign    Days of Exercise per Week: 4 days    Minutes of Exercise per Session: 40 min  Stress: No Stress Concern Present (03/28/2023)   [employer] of Occupational Health - Occupational Stress Questionnaire    Feeling of Stress : Not at all  Social Connections: Socially Isolated (03/28/2023)   Social Connection and Isolation Panel    Frequency of Communication with Friends and Family: Three times a week    Frequency of Social Gatherings with Friends and Family: Three times a week    Attends Religious Services: Never     Active Member of Clubs or Organizations: No    Attends Engineer, structural: Never    Marital Status: Divorced     Family History: The patient's family history includes Dementia in her mother; Diabetes in her father, maternal grandmother, mother, and paternal grandmother; Heart disease in her paternal grandmother; Hypertension in her father and mother; Renal cancer in her mother. There is no history of BRCA 1/2 or Breast cancer.  ROS:   Please see the history of present illness.     All other systems reviewed and are negative.  EKGs/Labs/Other Studies Reviewed:    The following studies were reviewed today:   Recent Labs: 09/13/2023: ALT 31; BUN 13; Creatinine, Ser 0.86; Hemoglobin 13.2; Platelets 338; Potassium 4.7; Sodium 141; TSH 2.070  Recent Lipid  Panel    Component Value Date/Time   CHOL 108 09/13/2023 1209   TRIG 91 09/13/2023 1209   HDL 39 (L) 09/13/2023 1209   CHOLHDL 2.8 09/13/2023 1209   LDLCALC 51 09/13/2023 1209    Physical Exam:    VS:  BP 130/77 (BP Location: Left Arm, Patient Position: Sitting, Cuff Size: Large)   Pulse 78   Ht 5' 6 (1.676 m)   Wt 256 lb 6.4 oz (116.3 kg)   LMP 12/01/2017   SpO2 96%   BMI 41.38 kg/m     Wt Readings from Last 3 Encounters:  02/17/24 256 lb 6.4 oz (116.3 kg)  09/13/23 258 lb 12.8 oz (117.4 kg)  04/13/23 252 lb 7.5 oz (114.5 kg)     GEN:  Well nourished, well developed in no acute distress HEENT: Normal NECK: No JVD; No carotid bruits CARDIAC: RRR, no murmurs, rubs, gallops RESPIRATORY: Mild rhonchi. ABDOMEN: Soft, non-tender, non-distended, obese MUSCULOSKELETAL: no edema; mild right-sided chest tenderness on palpation SKIN: Warm and dry NEUROLOGIC:  Alert and oriented x 3 PSYCHIATRIC:  Normal affect   ASSESSMENT:    1. Essential hypertension   2. PAF (paroxysmal atrial fibrillation) (HCC)   3. Mixed hyperlipidemia   4. PAF (paroxysmal atrial fibrillation) (HCC) Chronic    PLAN:    In order  of problems listed above;  Hypertension, BP controlled.  Continue chlorthalidone  25 mg daily,  lisinopril  40 mg daily, Lopressor  50 mg twice daily.  paroxysmal A. fib, palpitations, cardiac monitor with no evidence of A. fib.  Currently in sinus rhythm.  Continue Lopressor  50 mg twice daily, Pradaxa . hyperlipidemia, cholesterol controlled, continue Lipitor 20 mg daily.  Follow-up in 1 year  Total encounter time 35 minutes  Greater than 50% was spent in counseling and coordination of care with the patient  Medication Adjustments/Labs and Tests Ordered: Current medicines are reviewed at length with the patient today.  Concerns regarding medicines are outlined above.  Orders Placed This Encounter  Procedures   EKG 12-Lead   Meds ordered this encounter  Medications   dabigatran  (PRADAXA ) 150 MG CAPS capsule    Sig: TAKE 1 CAPSULE BY MOUTH TWICE DAILY( EVERY TWELVE HOURS)    Dispense:  180 capsule    Refill:  3    Pt should keep future appointment for long term refills, Thank you!    Patient Instructions  Medication Instructions:  Your physician recommends that you continue on your current medications as directed. Please refer to the Current Medication list given to you today.   *If you need a refill on your cardiac medications before your next appointment, please call your pharmacy*  Lab Work: No labs ordered today  If you have labs (blood work) drawn today and your tests are completely normal, you will receive your results only by: MyChart Message (if you have MyChart) OR A paper copy in the mail If you have any lab test that is abnormal or we need to change your treatment, we will call you to review the results.  Testing/Procedures: No test ordered today   Follow-Up: At [facility], you and your health needs are our priority.  As part of our continuing mission to provide you with exceptional heart care, our providers are all part of one team.  This team includes your  primary Cardiologist (physician) and Advanced Practice Providers or APPs (Physician Assistants and Nurse Practitioners) who all work together to provide you with the care you need, when you need it.  Your next appointment:   1 year(s)  Provider:   You may see Redell Cave, MD or one of the following Advanced Practice Providers on your designated Care Team:   Lonni Meager, NP Lesley Maffucci, PA-C Bernardino Bring, PA-C Jennifer Millwood, PA-C Tylene Lunch, NP Barnie Hila, NP    We recommend signing up for the patient portal called MyChart.  Sign up information is provided on this After Visit Summary.  MyChart is used to connect with patients for Virtual Visits (Telemedicine).  Patients are able to view lab/test results, encounter notes, upcoming appointments, etc.  Non-urgent messages can be sent to your provider as well.   To learn more about what you can do with MyChart, go to [email or web address].         Signed, Redell Cave, MD  02/17/2024 3:47 PM     [facility]

## 2024-04-23 ENCOUNTER — Other Ambulatory Visit: Payer: Self-pay | Admitting: Internal Medicine

## 2024-04-23 ENCOUNTER — Other Ambulatory Visit: Payer: Self-pay

## 2024-04-23 DIAGNOSIS — E785 Hyperlipidemia, unspecified: Secondary | ICD-10-CM

## 2024-04-24 MED ORDER — METOPROLOL TARTRATE 50 MG PO TABS: 50.0000 mg | ORAL_TABLET | Freq: Two times a day (BID) | ORAL | 3 refills | Status: AC

## 2024-04-30 ENCOUNTER — Telehealth: Payer: Self-pay | Admitting: *Deleted

## 2024-04-30 NOTE — Telephone Encounter (Signed)
 Attempted to call pt to get her set up for another appt with Dr Fredirick for EMB, left message for pt to call the office back.

## 2024-04-30 NOTE — Telephone Encounter (Signed)
-----   Message from Glenys Birk, MD sent at 04/23/2024  4:09 PM EST ----- Regarding: FW: Pelvic US  Please call and ask again for her to return for biopsy... ----- Message ----- From: Vicci Barnie NOVAK, MD Sent: 04/07/2023   8:57 AM EST To: Glenys GORMAN Birk, MD Subject: Pelvic US                                       Good morning.   I am the PCP for this patient whom you saw on 12/2022 for postmenopausal bleeding.  EMB was negative.  However according to your note, you did not get all the way up into the uterus.  You wanted to see how the ultrasound looked and then discussed repeat sampling. I ordered the US .  Report is in the EMR.  Showed fibroid uterus.  Pt reported no further bleeding on recent visit with me.  Please advise on next step.  Thanks.SABRA

## 2024-05-22 ENCOUNTER — Telehealth: Payer: Self-pay | Admitting: Family Medicine

## 2024-05-29 ENCOUNTER — Ambulatory Visit: Admitting: Dermatology
